# Patient Record
Sex: Female | Born: 1995 | Race: White | Hispanic: No | Marital: Single | Smoking: Former smoker
Health system: Southern US, Community
[De-identification: ages and names within clinical notes are randomized; demographics above are authoritative.]

## PROBLEM LIST (undated history)

## (undated) ENCOUNTER — Inpatient Hospital Stay (HOSPITAL_COMMUNITY): Payer: Self-pay

## (undated) DIAGNOSIS — F32A Depression, unspecified: Secondary | ICD-10-CM

## (undated) DIAGNOSIS — T7840XA Allergy, unspecified, initial encounter: Secondary | ICD-10-CM

## (undated) DIAGNOSIS — E119 Type 2 diabetes mellitus without complications: Secondary | ICD-10-CM

## (undated) DIAGNOSIS — F329 Major depressive disorder, single episode, unspecified: Secondary | ICD-10-CM

## (undated) DIAGNOSIS — F419 Anxiety disorder, unspecified: Secondary | ICD-10-CM

## (undated) DIAGNOSIS — F909 Attention-deficit hyperactivity disorder, unspecified type: Secondary | ICD-10-CM

## (undated) HISTORY — PX: DENTAL SURGERY: SHX609

## (undated) HISTORY — DX: Attention-deficit hyperactivity disorder, unspecified type: F90.9

## (undated) HISTORY — DX: Allergy, unspecified, initial encounter: T78.40XA

## (undated) HISTORY — DX: Anxiety disorder, unspecified: F41.9

## (undated) HISTORY — DX: Type 2 diabetes mellitus without complications: E11.9

## (undated) HISTORY — PX: THERAPEUTIC ABORTION: SHX798

---

## 2013-10-08 ENCOUNTER — Encounter: Payer: Self-pay | Admitting: Internal Medicine

## 2013-10-08 ENCOUNTER — Ambulatory Visit (INDEPENDENT_AMBULATORY_CARE_PROVIDER_SITE_OTHER): Payer: Commercial Indemnity | Admitting: Internal Medicine

## 2013-10-08 VITALS — BP 102/60 | HR 73 | Temp 97.7°F | Ht 64.0 in | Wt 123.5 lb

## 2013-10-08 DIAGNOSIS — Z23 Encounter for immunization: Secondary | ICD-10-CM

## 2013-10-08 DIAGNOSIS — F418 Other specified anxiety disorders: Secondary | ICD-10-CM | POA: Insufficient documentation

## 2013-10-08 DIAGNOSIS — F411 Generalized anxiety disorder: Secondary | ICD-10-CM

## 2013-10-08 DIAGNOSIS — Z0000 Encounter for general adult medical examination without abnormal findings: Secondary | ICD-10-CM

## 2013-10-08 HISTORY — DX: Other specified anxiety disorders: F41.8

## 2013-10-08 MED ORDER — ALPRAZOLAM 0.25 MG PO TABS: 0.2500 mg | ORAL_TABLET | Freq: Two times a day (BID) | ORAL | Status: DC | PRN

## 2013-10-08 NOTE — Patient Instructions (Signed)
Health Maintenance, 18- to 19-Year-Old SCHOOL PERFORMANCE After high school completion, the young adult may be attending college, technical or vocational school, or entering the military or the work force. SOCIAL AND EMOTIONAL DEVELOPMENT The young adult establishes adult relationships and explores sexual identity. Young adults may be living at home or in a college dorm or apartment. Increasing independence is important with young adults. Throughout adolescence, teens should assume responsibility of their own health care. IMMUNIZATIONS Most young adults should be fully vaccinated. A booster dose of Tdap (tetanus, diphtheria, and pertussis, or "whooping cough"), a dose of meningococcal vaccine to protect against a certain type of bacterial meningitis, hepatitis A, human papillomarvirus (HPV), chickenpox, or measles vaccines may be indicated, if not given at an earlier age. Annual influenza or "flu" vaccination should be considered during flu season.  TESTING Annual screening for vision and hearing problems is recommended. Vision should be screened objectively at least once between 18 and 19 years of age. The young adult may be screened for anemia or tuberculosis. Young adults should have a blood test to check for high cholesterol during this time period. Young adults should be screened for use of alcohol and drugs. If the young adult is sexually active, screening for sexually transmitted infections, pregnancy, or HIV may be performed. Screening for cervical cancer should be performed within 3 years of beginning sexual activity. NUTRITION AND ORAL HEALTH  Adequate calcium intake is important. Consume 3 servings of low-fat milk and dairy products daily. For those who do not drink milk or consume dairy products, calcium enriched foods, such as juice, bread, or cereal, dark, leafy greens, or canned fish are alternate sources of calcium.  Drink plenty of water. Limit fruit juice to 8 to 12 ounces per day.  Avoid sugary beverages or sodas.  Discourage skipping meals, especially breakfast. Teens should eat a good variety of vegetables and fruits, as well as lean meats.  Avoid high fat, high salt, and high sugar foods, such as candy, chips, and cookies.  Encourage young adults to participate in meal planning and preparation.  Eat meals together as a family whenever possible. Encourage conversation at mealtime.  Limit fast food choices and eating out at restaurants.  Brush teeth twice a day and floss.  Schedule dental exams twice a year. SLEEP Regular sleep habits are important. PHYSICAL, SOCIAL, AND EMOTIONAL DEVELOPMENT  One hour of regular physical activity daily is recommended. Continue to participate in sports.  Encourage young adults to develop their own interests and consider community service or volunteerism.  Provide guidance to the young adult in making decisions about college and work plans.  Make sure that young adults know that they should never be in a situation that makes them uncomfortable, and they should tell partners if they do not want to engage in sexual activity.  Talk to the young adult about body image. Eating disorders may be noted at this time. Young adults may also be concerned about being overweight. Monitor the young adult for weight gain or loss.  Mood disturbances, depression, anxiety, alcoholism, or attention problems may be noted in young adults. Talk to the caregiver if there are concerns about mental illness.  Negotiate limit setting and independent decision making.  Encourage the young adult to handle conflict without physical violence.  Avoid loud noises which may impair hearing.  Limit television and computer time to 2 hours per day. Individuals who engage in excessive sedentary activity are more likely to become overweight. RISK BEHAVIORS  Sexually active   young adults need to take precautions against pregnancy and sexually transmitted  infections. Talk to young adults about contraception.  Provide a tobacco-free and drug-free environment for the young adult. Talk to the young adult about drug, tobacco, and alcohol use among friends or at friends' homes. Make sure the young adult knows that smoking tobacco or marijuana and taking drugs have health consequences and may impact brain development.  Teach the young adult about appropriate use of over-the-counter or prescription medicines.  Establish guidelines for driving and for riding with friends.  Talk to young adults about the risks of drinking and driving or boating. Encourage the young adult to call you if he or she or friends have been drinking or using drugs.  Remind young adults to wear seat belts at all times in cars and life vests in boats.  Young adults should always wear a properly fitted helmet when they are riding a bicycle.  Use caution with all-terrain vehicles (ATVs) or other motorized vehicles.  Do not keep handguns in the home. (If you do, the gun and ammunition should be locked separately and out of the young adult's access.)  Equip your home with smoke detectors and change the batteries regularly. Make sure all family members know the fire escape plans for your home.  Teach young adults not to swim alone and not to dive in shallow water.  All individuals should wear sunscreen that protects against UVA and UVB light with at least a sun protection factor (SPF) of 30 when out in the sun. This minimizes sun burning. WHAT'S NEXT? Young adults should visit their pediatrician or family physician yearly. By young adulthood, health care should be transitioned to a family physician or internal medicine specialist. Sexually active females may want to begin annual physical exams with a gynecologist. Document Released: 03/02/2007 Document Revised: 02/27/2012 Document Reviewed: 03/22/2007 [employer] Patient Information 2014 [employer].  

## 2013-10-08 NOTE — Assessment & Plan Note (Signed)
Start low dose xanax prn  If doesn't work will refer to psych for evaluation of possible ADHD

## 2013-10-08 NOTE — Progress Notes (Signed)
HPI  Pt presents to the clinic today to establish care. She is transferring care from [facility] in [place]. She does have some concerns today about difficulty concentration, difficulty completing task. She is not sure if it is ADHD or anxiety. She has never been told she has ADHD. 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  Flu: yealry-needs one today Tetanus: 2011 LMP: 09/18/2013 Pap smear: never Dentist: yearly  Past Medical History  Diagnosis Date  . Allergy     No current outpatient prescriptions on file.   No current facility-administered medications for this visit.    No Known Allergies  Family History  Problem Relation Age of Onset  . Arthritis Maternal Grandmother   . Stroke Maternal Grandmother   . Sudden death Maternal Grandmother   . Depression Mother   . Diabetes Mother     History   Social History  . Marital Status: Single    Spouse Name: N/A    Number of Children: N/A  . Years of Education: N/A   Occupational History  . Not on file.   Social History Main Topics  . Smoking status: Current Every Day Smoker -- 0.50 packs/day  . Smokeless tobacco: Not on file  . Alcohol Use: No  . Drug Use: No  . Sexual Activity: Yes    Birth Control/ Protection: Condom   Other Topics Concern  . Not on file   Social History Narrative  . No narrative on file    ROS:  Constitutional: Denies fever, malaise, fatigue, headache or abrupt weight changes.  HEENT: Denies eye pain, eye redness, ear pain, ringing in the ears, wax buildup, runny nose, nasal congestion, bloody nose, or sore throat. Respiratory: Denies difficulty breathing, shortness of breath, cough or sputum production.   Cardiovascular: Denies chest pain, chest tightness, palpitations or swelling in the hands or feet.  Gastrointestinal: Denies abdominal pain, bloating, constipation, diarrhea or blood in the stool.   GU: Denies frequency, urgency, pain with urination, blood in urine, odor or discharge. Musculoskeletal: Denies decrease in range of motion, difficulty with gait, muscle pain or joint pain and swelling.  Skin: Denies redness, rashes, lesions or ulcercations.  Neurological: Denies dizziness, difficulty with memory, difficulty with speech or problems with balance and coordination.  Psych: Pt reports difficulty concentrating, procrastination, anxiety. Denies depression, SI/HI.  No other specific complaints in a complete review of systems (except as listed in HPI above).  PE:  BP 102/60  Pulse 73  Temp(Src) 97.7 F (36.5 C) (Oral)  Ht 5\' 4"  (1.626 m)  Wt 123 lb 8 oz (56.019 kg)  BMI 21.19 kg/m2  SpO2 98%  LMP 08/19/2013 Wt Readings from Last 3 Encounters:  10/08/13 123 lb 8 oz (56.019 kg) (42%*, Z = -0.20)   * Growth percentiles are based on CDC 2-20 Years data.    General: Appears her stated age, well developed, well nourished in NAD. HEENT: Head: normal shape and size; Eyes: sclera white, no icterus, conjunctiva pink, PERRLA and EOMs intact; Ears: Tm's gray and intact, normal light reflex; Nose: mucosa pink and moist, septum midline; Throat/Mouth: Teeth present, mucosa pink and moist, no lesions or ulcerations noted.  Neck: Normal range of motion. Neck supple, trachea midline. No massses, lumps or thyromegaly present.  Cardiovascular: Normal rate and rhythm. S1,S2 noted.  No murmur, rubs or gallops noted. No JVD or BLE edema. No carotid bruits noted.  Pulmonary/Chest: Normal effort and positive vesicular breath sounds. No respiratory distress. No wheezes, rales or ronchi noted.  Abdomen: Soft and nontender. Normal bowel sounds, no bruits noted. No distention or masses noted. Liver, spleen and kidneys non palpable. Musculoskeletal: Normal range of motion. No signs of joint swelling. No difficulty with gait.  Neurological: Alert and oriented. Cranial nerves II-XII intact. Coordination  normal. +DTRs bilaterally. Psychiatric: Mood and affect normal. Behavior is normal. Judgment and thought content normal.      Assessment and Plan:  Preventative Health maintenance:  Flu shot today Pt declines screening labs today Will return for her pap smear in 2 weeks Will discuss birth control and next visit Smoking cessation counseling given   RTC in 1 year or sooner if needed

## 2013-10-09 DIAGNOSIS — Z23 Encounter for immunization: Secondary | ICD-10-CM

## 2013-12-02 ENCOUNTER — Telehealth: Payer: Self-pay | Admitting: Internal Medicine

## 2013-12-02 NOTE — Telephone Encounter (Signed)
OK both Thx 

## 2013-12-02 NOTE — Telephone Encounter (Signed)
Pt called request to transfer from [facility] to [facility]. Pt also request pap smear. Please advise.

## 2013-12-03 NOTE — Telephone Encounter (Signed)
Can not leave vm, will continue to try °

## 2013-12-03 NOTE — Telephone Encounter (Signed)
Pt has an appt 1.12.15.

## 2013-12-09 ENCOUNTER — Ambulatory Visit: Payer: Self-pay | Admitting: Internal Medicine

## 2013-12-30 ENCOUNTER — Other Ambulatory Visit (HOSPITAL_COMMUNITY)
Admission: RE | Admit: 2013-12-30 | Discharge: 2013-12-30 | Disposition: A | Discharge status: Home / Self Care | Payer: Commercial Indemnity | Source: Ambulatory Visit | Attending: Internal Medicine | Admitting: Internal Medicine

## 2013-12-30 ENCOUNTER — Other Ambulatory Visit: Payer: Commercial Indemnity

## 2013-12-30 ENCOUNTER — Ambulatory Visit (INDEPENDENT_AMBULATORY_CARE_PROVIDER_SITE_OTHER): Payer: Commercial Indemnity | Admitting: Internal Medicine

## 2013-12-30 ENCOUNTER — Encounter: Payer: Self-pay | Admitting: Internal Medicine

## 2013-12-30 VITALS — BP 120/60 | HR 76 | Temp 98.0°F | Resp 16 | Ht 65.0 in | Wt 129.0 lb

## 2013-12-30 DIAGNOSIS — N898 Other specified noninflammatory disorders of vagina: Secondary | ICD-10-CM

## 2013-12-30 DIAGNOSIS — F418 Other specified anxiety disorders: Secondary | ICD-10-CM

## 2013-12-30 DIAGNOSIS — Z3009 Encounter for other general counseling and advice on contraception: Secondary | ICD-10-CM

## 2013-12-30 DIAGNOSIS — Z01419 Encounter for gynecological examination (general) (routine) without abnormal findings: Secondary | ICD-10-CM | POA: Insufficient documentation

## 2013-12-30 DIAGNOSIS — Z0000 Encounter for general adult medical examination without abnormal findings: Secondary | ICD-10-CM

## 2013-12-30 DIAGNOSIS — F411 Generalized anxiety disorder: Secondary | ICD-10-CM

## 2013-12-30 DIAGNOSIS — IMO0001 Reserved for inherently not codable concepts without codable children: Secondary | ICD-10-CM

## 2013-12-30 LAB — WET PREP, GENITAL
Clue Cells Wet Prep HPF POC: NONE SEEN — AB
Trich, Wet Prep: NONE SEEN — AB
WBC, Wet Prep HPF POC: NONE SEEN — AB
Yeast Wet Prep HPF POC: NONE SEEN — AB

## 2013-12-30 MED ORDER — VITAMIN D 1000 UNITS PO TABS: 1000.0000 [IU] | ORAL_TABLET | Freq: Every day | ORAL | Status: DC

## 2013-12-30 MED ORDER — ALPRAZOLAM 0.25 MG PO TABS: 0.2500 mg | ORAL_TABLET | Freq: Two times a day (BID) | ORAL | Status: DC | PRN

## 2013-12-30 MED ORDER — LEVONORGESTREL-ETHINYL ESTRAD 0.1-20 MG-MCG PO TABS: 1.0000 | ORAL_TABLET | Freq: Every day | ORAL | Status: DC

## 2013-12-30 NOTE — Progress Notes (Signed)
Pre visit review using our clinic review tool, if applicable. No additional management support is needed unless otherwise documented below in the visit note. 

## 2013-12-30 NOTE — Progress Notes (Signed)
   Subjective:    Patient ID: Madeline Mccormick, female    DOB: 02/10/1996, 18 y.o.   MRN: [id number]  HPI  The patient is here for a wellness exam. The patient has been doing well overall without major physical or psychological issues going on lately. LMP end of Dec - irreg  Review of Systems  Constitutional: Negative for fever, chills, diaphoresis, activity change, appetite change, fatigue and unexpected weight change.  HENT: Negative for congestion, dental problem, ear pain, hearing loss, mouth sores, postnasal drip, sinus pressure, sneezing, sore throat, tinnitus and voice change.   Eyes: Negative for pain and visual disturbance.  Respiratory: Negative for cough, chest tightness, wheezing and stridor.   Cardiovascular: Negative for chest pain, palpitations and leg swelling.  Gastrointestinal: Negative for nausea, vomiting, abdominal pain, blood in stool, abdominal distention and rectal pain.  Genitourinary: Negative for dysuria, frequency, hematuria, flank pain, decreased urine volume, vaginal bleeding, vaginal discharge, difficulty urinating, vaginal pain, menstrual problem and pelvic pain.  Musculoskeletal: Negative for back pain, gait problem, joint swelling and neck pain.  Skin: Negative for color change, rash and wound.  Neurological: Negative for dizziness, tremors, syncope, speech difficulty, weakness, light-headedness and numbness.  Hematological: Negative for adenopathy.  Psychiatric/Behavioral: Negative for suicidal ideas, hallucinations, behavioral problems, confusion, sleep disturbance, dysphoric mood and decreased concentration. The patient is not hyperactive.        Objective:   Physical Exam  Constitutional: She appears well-developed. No distress.  HENT:  Head: Normocephalic.  Right Ear: External ear normal.  Left Ear: External ear normal.  Nose: Nose normal.  Mouth/Throat: Oropharynx is clear and moist.  Eyes: Conjunctivae are normal. Pupils are equal, round, and  reactive to light. Right eye exhibits no discharge. Left eye exhibits no discharge.  Neck: Normal range of motion. Neck supple. No JVD present. No tracheal deviation present. No thyromegaly present.  Cardiovascular: Normal rate, regular rhythm and normal heart sounds.   Pulmonary/Chest: No stridor. No respiratory distress. She has no wheezes. She exhibits no tenderness.  Abdominal: Soft. Bowel sounds are normal. She exhibits no distension and no mass. There is no tenderness. There is no rebound and no guarding.  Genitourinary: Uterus normal. Vaginal discharge (minimal) found.  WNL PAP, KOH obtained B breasts WNL  Musculoskeletal: She exhibits no edema and no tenderness.  Lymphadenopathy:    She has no cervical adenopathy.  Neurological: She displays normal reflexes. No cranial nerve deficit. She exhibits normal muscle tone. Coordination normal.  Skin: No rash noted. No erythema. No pallor.  Psychiatric: She has a normal mood and affect. Her behavior is normal. Judgment and thought content normal.          Assessment & Plan:

## 2013-12-31 ENCOUNTER — Other Ambulatory Visit (INDEPENDENT_AMBULATORY_CARE_PROVIDER_SITE_OTHER): Payer: Commercial Indemnity

## 2013-12-31 ENCOUNTER — Encounter: Payer: Self-pay | Admitting: Internal Medicine

## 2013-12-31 DIAGNOSIS — N898 Other specified noninflammatory disorders of vagina: Secondary | ICD-10-CM | POA: Insufficient documentation

## 2013-12-31 DIAGNOSIS — IMO0001 Reserved for inherently not codable concepts without codable children: Secondary | ICD-10-CM

## 2013-12-31 DIAGNOSIS — Z0000 Encounter for general adult medical examination without abnormal findings: Secondary | ICD-10-CM

## 2013-12-31 DIAGNOSIS — Z3009 Encounter for other general counseling and advice on contraception: Secondary | ICD-10-CM | POA: Insufficient documentation

## 2013-12-31 LAB — URINALYSIS, ROUTINE W REFLEX MICROSCOPIC
BILIRUBIN URINE: NEGATIVE
Ketones, ur: NEGATIVE
LEUKOCYTES UA: NEGATIVE
NITRITE: NEGATIVE
PH: 6 (ref 5.0–8.0)
Specific Gravity, Urine: >=1.03 — AB (ref 1.000–1.030)
Total Protein, Urine: NEGATIVE
Urine Glucose: NEGATIVE
Urobilinogen, UA: 0.2 (ref 0.0–1.0)

## 2013-12-31 LAB — BASIC METABOLIC PANEL
BUN: 16 mg/dL (ref 6–23)
CO2: 24 meq/L (ref 19–32)
CREATININE: 0.8 mg/dL (ref 0.4–1.2)
Calcium: 9.2 mg/dL (ref 8.4–10.5)
Chloride: 108 mEq/L (ref 96–112)
GFR: 92.08 mL/min (ref >60.00)
GLUCOSE: 84 mg/dL (ref 70–99)
Potassium: 3.7 mEq/L (ref 3.5–5.1)
Sodium: 140 mEq/L (ref 135–145)

## 2013-12-31 LAB — CBC WITH DIFFERENTIAL/PLATELET
BASOS PCT: 0.5 % (ref 0.0–3.0)
Basophils Absolute: 0 10*3/uL (ref 0.0–0.1)
EOS ABS: 0.2 10*3/uL (ref 0.0–0.7)
Eosinophils Relative: 2.9 % (ref 0.0–5.0)
HCT: 39.7 % (ref 36.0–46.0)
Hemoglobin: 13.5 g/dL (ref 12.0–15.0)
Lymphocytes Relative: 38.5 % (ref 12.0–46.0)
Lymphs Abs: 2.1 10*3/uL (ref 0.7–4.0)
MCHC: 33.9 g/dL (ref 30.0–36.0)
MCV: 89.2 fl (ref 78.0–100.0)
Monocytes Absolute: 0.5 10*3/uL (ref 0.1–1.0)
Monocytes Relative: 9.1 % (ref 3.0–12.0)
NEUTROS ABS: 2.7 10*3/uL (ref 1.4–7.7)
Neutrophils Relative %: 49 % (ref 43.0–77.0)
Platelets: 284 10*3/uL (ref 150.0–400.0)
RBC: 4.46 Mil/uL (ref 3.87–5.11)
RDW: 13.2 % (ref 11.5–14.6)
WBC: 5.6 10*3/uL (ref 4.5–10.5)

## 2013-12-31 LAB — HEPATIC FUNCTION PANEL
ALK PHOS: 45 U/L (ref 39–117)
ALT: 9 U/L (ref 0–35)
AST: 16 U/L (ref 0–37)
Albumin: 4.1 g/dL (ref 3.5–5.2)
BILIRUBIN DIRECT: 0.1 mg/dL (ref 0.0–0.3)
BILIRUBIN TOTAL: 0.7 mg/dL (ref 0.3–1.2)
Total Protein: 7.5 g/dL (ref 6.0–8.3)

## 2013-12-31 LAB — LIPID PANEL
CHOL/HDL RATIO: 3
Cholesterol: 162 mg/dL (ref 0–200)
HDL: 63.8 mg/dL (ref >39.00)
LDL Cholesterol: 87 mg/dL (ref 0–99)
TRIGLYCERIDES: 54 mg/dL (ref 0.0–149.0)
VLDL: 10.8 mg/dL (ref 0.0–40.0)

## 2013-12-31 LAB — TSH: TSH: 2.06 u[IU]/mL (ref 0.35–5.50)

## 2013-12-31 NOTE — Assessment & Plan Note (Signed)
Discussed Started Alesse 1/15 Labs

## 2013-12-31 NOTE — Assessment & Plan Note (Signed)
We discussed age appropriate health related issues, including available/recomended screening tests and vaccinations. We discussed a need for adhering to healthy diet and exercise. Labs/EKG were reviewed/ordered. All questions were answered.  Labs Gardasil info

## 2013-12-31 NOTE — Assessment & Plan Note (Signed)
1/15  KOH

## 2014-01-01 LAB — HCG, SERUM, QUALITATIVE: Preg, Serum: NEGATIVE

## 2014-02-03 ENCOUNTER — Ambulatory Visit: Payer: Commercial Indemnity | Admitting: Internal Medicine

## 2014-02-10 ENCOUNTER — Encounter: Payer: Self-pay | Admitting: Internal Medicine

## 2014-02-10 ENCOUNTER — Ambulatory Visit (INDEPENDENT_AMBULATORY_CARE_PROVIDER_SITE_OTHER): Payer: Commercial Indemnity | Admitting: Internal Medicine

## 2014-02-10 VITALS — BP 128/72 | HR 80 | Temp 98.0°F | Resp 16 | Wt 132.0 lb

## 2014-02-10 DIAGNOSIS — J069 Acute upper respiratory infection, unspecified: Secondary | ICD-10-CM

## 2014-02-10 MED ORDER — AZITHROMYCIN 250 MG PO TABS: ORAL_TABLET | ORAL | Status: DC

## 2014-02-10 MED ORDER — PROMETHAZINE-CODEINE 6.25-10 MG/5ML PO SYRP: 5.0000 mL | ORAL_SOLUTION | ORAL | Status: DC | PRN

## 2014-02-10 NOTE — Progress Notes (Signed)
   Subjective:    Patient ID: Madeline Mccormick, female    DOB: 11/22/1996, 18 y.o.   MRN: [id number]  URI  This is a new problem. The current episode started 1 to 4 weeks ago (2 wks). The problem has been gradually worsening. There has been no fever. Associated symptoms include congestion, coughing, headaches, rhinorrhea and sinus pain. Pertinent negatives include no abdominal pain.      Review of Systems  HENT: Positive for congestion and rhinorrhea.   Respiratory: Positive for cough.   Gastrointestinal: Negative for abdominal pain.  Neurological: Positive for headaches.       Objective:   Physical Exam  Constitutional: She appears well-developed. No distress.  HENT:  Head: Normocephalic.  Right Ear: External ear normal.  Left Ear: External ear normal.  Nose: Nose normal.  Mouth/Throat: No oropharyngeal exudate.  eryth throat Green mucus i B nares  Eyes: Conjunctivae are normal. Pupils are equal, round, and reactive to light. Right eye exhibits no discharge. Left eye exhibits no discharge.  Neck: Normal range of motion. Neck supple. No JVD present. No tracheal deviation present. No thyromegaly present.  Cardiovascular: Normal rate, regular rhythm and normal heart sounds.   Pulmonary/Chest: No stridor. No respiratory distress. She has no wheezes.  Abdominal: Soft. Bowel sounds are normal. She exhibits no distension and no mass. There is no tenderness. There is no rebound and no guarding.  Musculoskeletal: She exhibits no edema and no tenderness.  Lymphadenopathy:    She has no cervical adenopathy.  Neurological: She displays normal reflexes. No cranial nerve deficit. She exhibits normal muscle tone. Coordination normal.  Skin: No rash noted. No erythema.  Psychiatric: She has a normal mood and affect. Her behavior is normal. Judgment and thought content normal.          Assessment & Plan:

## 2014-02-10 NOTE — Patient Instructions (Signed)
Use over-the-counter  "cold" medicines  such as "Tylenol cold" , "Advil cold",  "Mucinex" or" Mucinex D"  for cough and congestion.   Avoid decongestants if you have high blood pressure and use "Afrin" nasal spray for nasal congestion as directed instead. Use" Delsym" or" Robitussin" cough syrup varietis for cough.  You can use plain "Tylenol" or "Advil" for fever, chills and achyness.  Please, make an appointment if you are not better or if you're worse.  

## 2014-02-10 NOTE — Assessment & Plan Note (Signed)
Zpac Prom - cod syr prn BCP and abx interaction was mentioned

## 2014-02-10 NOTE — Progress Notes (Signed)
Pre visit review using our clinic review tool, if applicable. No additional management support is needed unless otherwise documented below in the visit note. 

## 2014-04-30 ENCOUNTER — Ambulatory Visit (INDEPENDENT_AMBULATORY_CARE_PROVIDER_SITE_OTHER): Payer: Commercial Indemnity | Admitting: Internal Medicine

## 2014-04-30 ENCOUNTER — Encounter: Payer: Self-pay | Admitting: Internal Medicine

## 2014-04-30 VITALS — BP 108/52 | Temp 98.2°F | Wt 124.0 lb

## 2014-04-30 DIAGNOSIS — F329 Major depressive disorder, single episode, unspecified: Secondary | ICD-10-CM

## 2014-04-30 DIAGNOSIS — F411 Generalized anxiety disorder: Secondary | ICD-10-CM

## 2014-04-30 DIAGNOSIS — M7061 Trochanteric bursitis, right hip: Secondary | ICD-10-CM | POA: Insufficient documentation

## 2014-04-30 DIAGNOSIS — F418 Other specified anxiety disorders: Secondary | ICD-10-CM

## 2014-04-30 DIAGNOSIS — F3289 Other specified depressive episodes: Secondary | ICD-10-CM

## 2014-04-30 DIAGNOSIS — M76899 Other specified enthesopathies of unspecified lower limb, excluding foot: Secondary | ICD-10-CM

## 2014-04-30 HISTORY — DX: Trochanteric bursitis, right hip: M70.61

## 2014-04-30 MED ORDER — BUPROPION HCL ER (XL) 150 MG PO TB24: 150.0000 mg | ORAL_TABLET | Freq: Every day | ORAL | Status: DC

## 2014-04-30 MED ORDER — NAPROXEN 500 MG PO TABS: 500.0000 mg | ORAL_TABLET | Freq: Two times a day (BID) | ORAL | Status: DC

## 2014-04-30 NOTE — Progress Notes (Signed)
   Subjective:     HPI  C/o R hip pain off and on x 2 y C/o depression C/o ADD issues  Review of Systems  HENT: Positive for congestion and rhinorrhea.   Respiratory: Positive for cough.   Gastrointestinal: Negative for abdominal pain.  Neurological: Positive for headaches.   BP Readings from Last 3 Encounters:  04/30/14 108/52  02/10/14 128/72  12/30/13 120/60   Wt Readings from Last 3 Encounters:  04/30/14 124 lb (56.246 kg)  02/10/14 132 lb (59.875 kg) (57%*, Z = 0.17)  12/30/13 129 lb (58.514 kg) (52%*, Z = 0.04)   * Growth percentiles are based on CDC 2-20 Years data.        Objective:   Physical Exam  Constitutional: She appears well-developed. No distress.  HENT:  Head: Normocephalic.  Right Ear: External ear normal.  Left Ear: External ear normal.  Nose: Nose normal.  Mouth/Throat: No oropharyngeal exudate.  eryth throat Green mucus i B nares  Eyes: Conjunctivae are normal. Pupils are equal, round, and reactive to light. Right eye exhibits no discharge. Left eye exhibits no discharge.  Neck: Normal range of motion. Neck supple. No JVD present. No tracheal deviation present. No thyromegaly present.  Cardiovascular: Normal rate, regular rhythm and normal heart sounds.   Pulmonary/Chest: No stridor. No respiratory distress. She has no wheezes.  Abdominal: Soft. Bowel sounds are normal. She exhibits no distension and no mass. There is no tenderness. There is no rebound and no guarding.  Musculoskeletal: She exhibits no edema and no tenderness.  Lymphadenopathy:    She has no cervical adenopathy.  Neurological: She displays normal reflexes. No cranial nerve deficit. She exhibits normal muscle tone. Coordination normal.  Skin: No rash noted. No erythema.  Psychiatric: She has a normal mood and affect. Her behavior is normal. Judgment and thought content normal.   Lab Results  Component Value Date   WBC 5.6 12/31/2013   HGB 13.5 12/31/2013   HCT 39.7 12/31/2013    PLT 284.0 12/31/2013   GLUCOSE 84 12/31/2013   CHOL 162 12/31/2013   TRIG 54.0 12/31/2013   HDL 63.80 12/31/2013   LDLCALC 87 12/31/2013   ALT 9 12/31/2013   AST 16 12/31/2013   NA 140 12/31/2013   K 3.7 12/31/2013   CL 108 12/31/2013   CREATININE 0.8 12/31/2013   BUN 16 12/31/2013   CO2 24 12/31/2013   TSH 2.06 12/31/2013          Assessment & Plan:

## 2014-04-30 NOTE — Progress Notes (Signed)
Pre visit review using our clinic review tool, if applicable. No additional management support is needed unless otherwise documented below in the visit note. 

## 2014-04-30 NOTE — Patient Instructions (Signed)
Hip openers

## 2014-04-30 NOTE — Assessment & Plan Note (Signed)
Naproxen 500mg  

## 2014-05-04 ENCOUNTER — Encounter: Payer: Self-pay | Admitting: Internal Medicine

## 2014-05-04 DIAGNOSIS — F329 Major depressive disorder, single episode, unspecified: Secondary | ICD-10-CM | POA: Insufficient documentation

## 2014-05-04 DIAGNOSIS — F32A Depression, unspecified: Secondary | ICD-10-CM | POA: Insufficient documentation

## 2014-05-04 HISTORY — DX: Depression, unspecified: F32.A

## 2014-05-04 NOTE — Assessment & Plan Note (Signed)
Discussed Start Wellbutrin XR

## 2014-05-04 NOTE — Assessment & Plan Note (Signed)
Discussed Start Wellbutrin XR 

## 2014-05-16 ENCOUNTER — Other Ambulatory Visit: Payer: Commercial Indemnity

## 2014-11-18 ENCOUNTER — Encounter: Payer: Self-pay | Admitting: Internal Medicine

## 2014-11-18 ENCOUNTER — Ambulatory Visit (INDEPENDENT_AMBULATORY_CARE_PROVIDER_SITE_OTHER): Payer: Commercial Indemnity | Admitting: Internal Medicine

## 2014-11-18 VITALS — BP 105/70 | HR 60 | Temp 97.9°F | Ht 65.0 in | Wt 115.0 lb

## 2014-11-18 DIAGNOSIS — R634 Abnormal weight loss: Secondary | ICD-10-CM

## 2014-11-18 DIAGNOSIS — R7309 Other abnormal glucose: Secondary | ICD-10-CM

## 2014-11-18 DIAGNOSIS — R7303 Prediabetes: Secondary | ICD-10-CM | POA: Insufficient documentation

## 2014-11-18 DIAGNOSIS — R300 Dysuria: Secondary | ICD-10-CM

## 2014-11-18 LAB — URINALYSIS, ROUTINE W REFLEX MICROSCOPIC
Bilirubin Urine: NEGATIVE
Hgb urine dipstick: NEGATIVE
Ketones, ur: NEGATIVE
Leukocytes, UA: NEGATIVE
Nitrite: NEGATIVE
PH: 7 (ref 5.0–8.0)
RBC / HPF: NONE SEEN (ref 0–?)
SPECIFIC GRAVITY, URINE: 1.02 (ref 1.000–1.030)
TOTAL PROTEIN, URINE-UPE24: NEGATIVE
URINE GLUCOSE: NEGATIVE
Urobilinogen, UA: 0.2 (ref 0.0–1.0)

## 2014-11-18 LAB — T4, FREE: FREE T4: 0.98 ng/dL (ref 0.60–1.60)

## 2014-11-18 LAB — T3, FREE: T3, Free: 3.6 pg/mL (ref 2.3–4.2)

## 2014-11-18 LAB — HEMOGLOBIN A1C: Hgb A1c MFr Bld: 5.7 % (ref 4.6–6.5)

## 2014-11-18 LAB — TSH: TSH: 1.18 u[IU]/mL (ref 0.35–5.50)

## 2014-11-18 NOTE — Progress Notes (Signed)
Patient ID: Madeline Mccormick, female   DOB: 1996/06/01, 18 y.o.   MRN: [id number]  HPI: Madeline Mccormick is a 18 y.o.-year-old female, referred by her PCP, Dr. Fara Chute, for management of prediabetes.  Pt presented to the dr for [facility] if she delays meals. Also, she feels weak (labout to pass out) when delays the meal. She craves sweets. Patient has been diagnosed with prediabetes in 09/2014. She describes frequent urination and burning.   Last hemoglobin A1c was: 09/26/2014: HbA1c 5.8%  She is not on any medicines for prediabetes.  Pt's meals are: - Breakfast: skips - Lunch: sandwich; pasta; meat - Dinner: pasta or vegetable + meat - Snacks: candy + sodas: mountain dew - 1-2 a day; + sweet tea - 8 oz a day or more  - no CKD, last BUN/creatinine:  09/2014: 14/0.85, GFR 99 Lab Results  Component Value Date   BUN 16 12/31/2013   CREATININE 0.8 12/31/2013   - last set of lipids: Lab Results  Component Value Date   CHOL 162 12/31/2013   HDL 63.80 12/31/2013   LDLCALC 87 12/31/2013   TRIG 54.0 12/31/2013   CHOLHDL 3 12/31/2013   - last eye exam was 2 years ago.  - no numbness and tingling in her feet.  Pt has FH of DM1 in mother (dx in her 61s).  Has FH of thyroid ds in mother. Last TSH normal: Lab Results  Component Value Date   TSH 2.06 12/31/2013   Denies palpitations, but has 17 lbs weight loss in last 10 mo.  She has tremors with outstretched hands.   ROS: Constitutional: + weight loss, no fatigue,+ subjective hypothermia, + poor sleep Eyes: no blurry vision, no xerophthalmia ENT: no sore throat, no nodules palpated in throat, no dysphagia/odynophagia, no hoarseness Cardiovascular: no CP/SOB/palpitations/leg swelling Respiratory: no cough/SOB Gastrointestinal: no N/V/D/C Musculoskeletal: no muscle/joint aches Skin: no rashes, + easy bruising Neurological: no tremors/numbness/tingling/dizziness Psychiatric: + depression/+ anxiety + dysuria  Past Medical History   Diagnosis Date  . Allergy   . Anxiety    Past Surgical History  Procedure Laterality Date  . Dental surgery     History   Social History  . 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 Smoking status: Light Tobacco Smoker -- 0.50 packs/day    Types: Cigarettes    Last Attempt to Quit: 12/19/2013  . Alcohol Use: No  . Drug Use: No  . Sexual Activity: Yes    Birth Control/ Protection: Condom   Current Outpatient Prescriptions on File Prior to Visit  Medication Sig Dispense Refill  . levonorgestrel-ethinyl estradiol (AVIANE,ALESSE,LESSINA) 0.1-20 MG-MCG tablet Take 1 tablet by mouth daily. 30 tablet 11  . cholecalciferol (VITAMIN D) 1000 UNITS tablet Take 1 tablet (1,000 Units total) by mouth daily. (Patient not taking: Reported on 11/18/2014) 100 tablet 3  . naproxen (NAPROSYN) 500 MG tablet Take 1 tablet (500 mg total) by mouth 2 (two) times daily with a meal. (Patient not taking: Reported on 11/18/2014) 60 tablet 2   No current facility-administered medications on file prior to visit.   No Known Allergies Family History  Problem Relation Age of Onset  . Arthritis Maternal Grandmother   . Stroke Maternal Grandmother   . Sudden death Maternal Grandmother   . Depression Mother   . Diabetes Mother    PE: BP 105/70 mmHg  Pulse 60  Temp(Src) 97.9 F (36.6 C)  Ht 5\' 5"  (1.651 m)  Wt 115 lb (52.164 kg)  BMI 19.14 kg/m2  SpO2 98% Wt Readings from Last 3 Encounters:  11/18/14 115 lb (52.164 kg)  04/30/14 124 lb (56.246 kg)  02/10/14 132 lb (59.875 kg) (57 %*, Z = 0.17)   * Growth percentiles are based on CDC 2-20 Years data.   Constitutional: normal weight, in NAD Eyes: PERRLA, EOMI, no exophthalmos ENT: moist mucous membranes, no thyromegaly, no cervical lymphadenopathy Cardiovascular: RRR, No MRG Respiratory: CTA B Gastrointestinal: abdomen soft, NT, ND,  BS+ Musculoskeletal: no deformities, strength intact in all 4 Skin: moist, warm, no rashes Neurological: + tremor with outstretched hands, DTR normal in all 4  ASSESSMENT: 1. Prediabetes  2. Dysuria  3. Weight loss - 17 lbs, unintentional  PLAN:  1. Patient with a recent HbA1c that was elevated, at 5.8%. She has frequent urination, however she also complains of dysuria so she might have a urinary tract infection. She drinks many regular sodas and sweet tea in the day, and I advised her to stop doing so. She also eats a lot of candy and we discussed about "sugar crash" the can because by high glycemic index diet. I advised her to try to limit the amount of simple carbs that she eats, and definitely not to drink any liquid simple carbs. I also strongly advised her to start eating breakfast so she can avoid hypoglycemia later in the day. - We discussed about options for treatment, and I suggested to:  Patient Instructions  Please stop at the lab. Stop drinking sweet sodas and sweet tea. Check sugars every day or every other day at different times of the day  - bring log at next visit. Please come back for a follow-up appointment in 3 months. - I advised her to start checking sugars at different times of the day - check once every day or every other day, rotating checks - We gave her a meter and advised her how to check her sugars - given sugar log and advised how to fill it and to bring it at next appt  - given foot care handout and explained the principles  - given instructions for hypoglycemia management "15-15 rule"  - We'll check another hemoglobin A1c today to confirm the diagnosis of prediabetes - Return to clinic in 3 mo with sugar log   2. Dysuria - We will check a urinalysis with reflex microbiology  3. Unintentional weight loss  - Reviewing her chart, the patient has lost 17 pounds since 01/2014. She mentions that she did not try to lose the weight and this happened even  though she moved home during this year. Since she also has tremors with outstretched hands, I would like to check her for thyroid disease  - We'll check TSH, free T4, free T3 today   Office Visit on 11/18/2014  Component Date Value Ref Range Status  . Hgb A1c MFr Bld 11/18/2014 5.7  4.6 - 6.5 % Final   Glycemic Control Guidelines for People with Diabetes:Non Diabetic:  <6%Goal of Therapy: <7%Additional Action Suggested:  >8%   . Color, Urine 11/18/2014 YELLOW  Yellow;Lt. Yellow Final  . APPearance 11/18/2014 CLEAR  Clear Final  . Specific Gravity, Urine 11/18/2014 1.020  1.000-1.030 Final  . pH 11/18/2014 7.0  5.0 - 8.0 Final  . Total Protein, Urine 11/18/2014 NEGATIVE  Negative Final  . Urine Glucose 11/18/2014 NEGATIVE  Negative Final  .  Ketones, ur 11/18/2014 NEGATIVE  Negative Final  . Bilirubin Urine 11/18/2014 NEGATIVE  Negative Final  . Hgb urine dipstick 11/18/2014 NEGATIVE  Negative Final  . Urobilinogen, UA 11/18/2014 0.2  0.0 - 1.0 Final  . Leukocytes, UA 11/18/2014 NEGATIVE  Negative Final  . Nitrite 11/18/2014 NEGATIVE  Negative Final  . WBC, UA 11/18/2014 0-2/hpf  0-2/hpf Final  . RBC / HPF 11/18/2014 none seen  0-2/hpf Final  . Squamous Epithelial / LPF 11/18/2014 Rare(0-4/hpf)  Rare(0-4/hpf) Final  . TSH 11/18/2014 1.18  0.35 - 5.50 uIU/mL Final  . Free T4 11/18/2014 0.98  0.60 - 1.60 ng/dL Final  . T3, Free 86/57/846912/12/2013 3.6  2.3 - 4.2 pg/mL Final   Msg sent: Dear [place], Labs are normal, except the HbA1c which is borderline between normal and prediabetes. Start the dietary changes that I suggested and it will decrease. You do not have a urinary tract infection. Thyroid tests are normal, too. Sincerely, Carlus Pavlovristina Lorence Nagengast MD

## 2014-11-18 NOTE — Patient Instructions (Signed)
Please stop at the lab. Stop drinking sweet sodas and sweet tea. Check sugars every day or every other day at different times of the day  - bring log at next visit. Please come back for a follow-up appointment in 3 months.

## 2014-11-18 NOTE — Progress Notes (Signed)
Pre visit review using our clinic review tool, if applicable. No additional management support is needed unless otherwise documented below in the visit note. 

## 2014-11-24 ENCOUNTER — Telehealth: Payer: Self-pay | Admitting: Internal Medicine

## 2014-11-24 ENCOUNTER — Other Ambulatory Visit: Payer: Self-pay | Admitting: *Deleted

## 2014-11-24 NOTE — Telephone Encounter (Signed)
Patient called stating she never received a rx for her test strips   [facility]    Thank you

## 2014-11-25 ENCOUNTER — Telehealth: Payer: Self-pay | Admitting: Internal Medicine

## 2014-11-25 MED ORDER — GLUCOSE BLOOD VI STRP: ORAL_STRIP | Status: DC

## 2014-11-25 NOTE — Telephone Encounter (Signed)
Called pt back again. Advised her to let front office staff know her meter so we can order her strips. Done.

## 2014-11-25 NOTE — Telephone Encounter (Signed)
Pt is calling to get in touch with you regarding test strips.

## 2014-11-25 NOTE — Telephone Encounter (Signed)
Called pt and lvm advising her to let us know what test meter she has so we can call in her test strips.

## 2014-12-31 ENCOUNTER — Encounter: Payer: Commercial Indemnity | Admitting: Internal Medicine

## 2015-02-17 ENCOUNTER — Ambulatory Visit: Payer: Commercial Indemnity | Admitting: Internal Medicine

## 2015-04-02 ENCOUNTER — Ambulatory Visit (INDEPENDENT_AMBULATORY_CARE_PROVIDER_SITE_OTHER): Payer: Commercial Indemnity | Admitting: Internal Medicine

## 2015-04-02 ENCOUNTER — Encounter: Payer: Self-pay | Admitting: Internal Medicine

## 2015-04-02 VITALS — BP 102/58 | HR 90 | Temp 98.2°F | Resp 12 | Wt 109.0 lb

## 2015-04-02 DIAGNOSIS — R7303 Prediabetes: Secondary | ICD-10-CM

## 2015-04-02 DIAGNOSIS — R634 Abnormal weight loss: Secondary | ICD-10-CM

## 2015-04-02 DIAGNOSIS — R7309 Other abnormal glucose: Secondary | ICD-10-CM | POA: Diagnosis not present

## 2015-04-02 LAB — HEMOGLOBIN A1C: Hgb A1c MFr Bld: 5.3 % (ref 4.6–6.5)

## 2015-04-02 NOTE — Patient Instructions (Signed)
Please stop at the lab.  Please check sugars every other day.

## 2015-04-02 NOTE — Progress Notes (Signed)
Patient ID: Madeline Mccormick, female   DOB: 12/22/1995, 19 y.o.   MRN: [id number]  HPI: Madeline Mccormick is a 19 y.o.-year-old female, initially referred by her PCP, Dr. Fara Chute, for management of prediabetes. Last visit:4 mo ago.  Reviewed and addended hx: Pt presented to the dr for [facility] if she delays meals. Also, she was feeling weak (about to pass out) when delayed a meal. She also was craving sweets. She was drinking a lot of regular soda and eating many sweets.  Patient has been diagnosed with prediabetes in 09/2014.   At last visit, we discussed about improving her diet, to eliminate the sweets in her diet and the regular sodas. She was able to eliminate the sweets but only to reduce the sodas. She is already feeling better and she does not feel weak or faint if she delays a meal!  We also gave her a glucometer at last visit. After last visit >> she felt palpitations and weakness/fatigue/SOB >> sugar checked: >200! This happened once.  She had lost 17 lbs weight loss before last visit. She lost 6 more lbs since last visit. This is unintentional, but she has been cutting down sugars as mentioned above and was also started on Vyvanse. We checked thyroid tests at last visit and they were normal.  Last hemoglobin A1c was: Lab Results  Component Value Date   HGBA1C 5.7 11/18/2014  09/26/2014: HbA1c 5.8%  She is not on any medicines for prediabetes.   - no CKD, last BUN/creatinine:  09/2014: 14/0.85, GFR 99 Lab Results  Component Value Date   BUN 16 12/31/2013   CREATININE 0.8 12/31/2013   - last set of lipids: Lab Results  Component Value Date   CHOL 162 12/31/2013   HDL 63.80 12/31/2013   LDLCALC 87 12/31/2013   TRIG 54.0 12/31/2013   CHOLHDL 3 12/31/2013   - last eye exam was 2 years ago.  - no numbness and tingling in her feet.  Last TSH normal: Lab Results  Component Value Date   TSH 1.18 11/18/2014   ROS: Constitutional: + weight loss, no fatigue,no subjective  hypothermia Eyes: no blurry vision, no xerophthalmia ENT: no sore throat, no nodules palpated in throat, no dysphagia/odynophagia, no hoarseness Cardiovascular: no CP/SOB/palpitations/leg swelling Respiratory: no cough/SOB Gastrointestinal: no N/V/D/C Musculoskeletal: no muscle/joint aches Skin: no rashes Neurological: no tremors/numbness/tingling/dizziness Psychiatric: + depression/+ anxiety  I reviewed pt's medications, allergies, PMH, social hx, family hx, and changes were documented in the history of present illness. Otherwise, unchanged from my initial visit note. She started Vivance 2 weeks ago.   Past Medical History  Diagnosis Date  . Allergy   . Anxiety    Past Surgical History  Procedure Laterality Date  . Dental surgery     History   Social History  . 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 Smoking status: Light Tobacco Smoker -- 0.50 packs/day    Types: Cigarettes    Last Attempt to Quit: 12/19/2013  . Alcohol Use: No  . Drug Use: No  . Sexual Activity: Yes    Birth Control/ Protection: Condom   Current Outpatient Prescriptions on File Prior to Visit  Medication Sig Dispense Refill  . glucose blood (ONETOUCH VERIO) test strip Use to test blood sugar 3 times daily as instructed. 100 each 11  . citalopram (CELEXA) 20 MG  tablet     . naproxen (NAPROSYN) 500 MG tablet Take 1 tablet (500 mg total) by mouth 2 (two) times daily with a meal. (Patient not taking: Reported on 04/02/2015) 60 tablet 2   No current facility-administered medications on file prior to visit.   No Known Allergies Family History  Problem Relation Age of Onset  . Arthritis Maternal Grandmother   . Stroke Maternal Grandmother   . Sudden death Maternal Grandmother   . Depression Mother   . Diabetes Mother    PE: BP 102/58 mmHg  Pulse 90  Temp(Src) 98.2 F (36.8 C) (Oral)   Resp 12  Wt 109 lb (49.442 kg)  SpO2 97% Body mass index is 18.14 kg/(m^2). Wt Readings from Last 3 Encounters:  04/02/15 109 lb (49.442 kg)  11/18/14 115 lb (52.164 kg)  04/30/14 124 lb (56.246 kg)   Constitutional: thin, in NAD Eyes: PERRLA, EOMI, no exophthalmos ENT: moist mucous membranes, no thyromegaly, no cervical lymphadenopathy Cardiovascular: RRR, No MRG Respiratory: CTA B Gastrointestinal: abdomen soft, NT, ND, BS+ Musculoskeletal: no deformities, strength intact in all 4 Skin: moist, warm, no rashes Neurological: no tremor with outstretched hands, DTR normal in all 4  ASSESSMENT: 1. Prediabetes  2. Weight loss - 17 + 6 lbs, unintentional  PLAN:  1. Patient with recent HbA1c levels that were elevated, at 5.8%, then 5.7%. She still drinks regular sodas but cut down on other sweets. She feels better. She had a high value on a fingerstick 2 mo ago. No high sugars anymore when she checks but did not check many toimes. - We discussed about options for treatment, and I suggested to:  Patient Instructions  Please stop at the lab. Stop drinking sweet sodas and sweet tea. Check sugars every day or every other day at different times of the day  - bring log at next visit. Please come back for a follow-up appointment in 3 months. - I advised her to start checking sugars at different times of the day - check once every other day  - We'll check another hemoglobin A1c today - Return to clinic in 3 mo with sugar log   2. Unintentional weight loss  - Reviewing her chart, the patient has lost 23 pounds since 01/2014.  - reviewed TSH, free T4, free T3 from 4 mo ago >> normal - now on Vyvanse, which can contribute - advised to increase calories/proteins in diet  Office Visit on 04/02/2015  Component Date Value Ref Range Status  . Hgb A1c MFr Bld 04/02/2015 5.3  4.6 - 6.5 % Final   Glycemic Control Guidelines for People with Diabetes:Non Diabetic:  <6%Goal of Therapy:  <7%Additional Action Suggested:  >8%    HbA1c improved after cutting out sweets.

## 2016-04-11 ENCOUNTER — Encounter (HOSPITAL_COMMUNITY): Payer: Self-pay

## 2016-04-11 ENCOUNTER — Emergency Department (HOSPITAL_COMMUNITY)
Admission: EM | Admit: 2016-04-11 | Discharge: 2016-04-11 | Disposition: A | Discharge status: Home / Self Care | Payer: 59 | Attending: Emergency Medicine | Admitting: Emergency Medicine

## 2016-04-11 DIAGNOSIS — J069 Acute upper respiratory infection, unspecified: Secondary | ICD-10-CM

## 2016-04-11 DIAGNOSIS — J029 Acute pharyngitis, unspecified: Secondary | ICD-10-CM

## 2016-04-11 DIAGNOSIS — F17210 Nicotine dependence, cigarettes, uncomplicated: Secondary | ICD-10-CM | POA: Diagnosis not present

## 2016-04-11 DIAGNOSIS — R51 Headache: Secondary | ICD-10-CM | POA: Diagnosis not present

## 2016-04-11 LAB — URINALYSIS, ROUTINE W REFLEX MICROSCOPIC
Bilirubin Urine: NEGATIVE
Glucose, UA: NEGATIVE mg/dL
KETONES UR: NEGATIVE mg/dL
Leukocytes, UA: NEGATIVE
Nitrite: NEGATIVE
PROTEIN: NEGATIVE mg/dL
Specific Gravity, Urine: 1.015 (ref 1.005–1.030)
pH: 5.5 (ref 5.0–8.0)

## 2016-04-11 LAB — URINE MICROSCOPIC-ADD ON

## 2016-04-11 LAB — RAPID STREP SCREEN (MED CTR MEBANE ONLY): Streptococcus, Group A Screen (Direct): NEGATIVE

## 2016-04-11 LAB — PREGNANCY, URINE: PREG TEST UR: NEGATIVE

## 2016-04-11 NOTE — ED Notes (Signed)
Called for pt, no answer

## 2016-04-11 NOTE — ED Notes (Signed)
Patient c/o sore throat, dysuria, and a "knot on neck" pt c/o soreness to posterior neck.

## 2016-04-11 NOTE — Discharge Instructions (Signed)
Your strep test is negative. Your urine pregnancy test is negative. Your urine test is negative for acute infection. Your temperature is slightly above the normal at 100.1. Your examination suggest pharyngitis (sore throat) with upper respiratory infection. Please increase water, juices, Gatorade. Please use Tylenol every 4 hours, or ibuprofen every 6 hours. Please wash hands frequently. Use salt water gargles, and Chloraseptic spray for assistance with the discomfort. Please see your primary physician for recheck of your urine and the problem with dysuria. Pharyngitis Pharyngitis is a sore throat (pharynx). There is redness, pain, and swelling of your throat. HOME CARE   Drink enough fluids to keep your pee (urine) clear or pale yellow.  Only take medicine as told by your doctor.  You may get sick again if you do not take medicine as told. Finish your medicines, even if you start to feel better.  Do not take aspirin.  Rest.  Rinse your mouth (gargle) with salt water ( tsp of salt per 1 qt of water) every 1-2 hours. This will help the pain.  If you are not at risk for choking, you can suck on hard candy or sore throat lozenges. GET HELP IF:  You have large, tender lumps on your neck.  You have a rash.  You cough up green, yellow-brown, or bloody spit. GET HELP RIGHT AWAY IF:   You have a stiff neck.  You drool or cannot swallow liquids.  You throw up (vomit) or are not able to keep medicine or liquids down.  You have very bad pain that does not go away with medicine.  You have problems breathing (not from a stuffy nose). MAKE SURE YOU:   Understand these instructions.  Will watch your condition.  Will get help right away if you are not doing well or get worse.   This information is not intended to replace advice given to you by your health care provider. Make sure you discuss any questions you have with your health care provider.   Document Released: 05/23/2008 Document  Revised: 09/25/2013 Document Reviewed: 08/12/2013 Elsevier Interactive Patient Education [employer]2016 Elsevier Inc.

## 2016-04-11 NOTE — ED Provider Notes (Signed)
CSN: [id number]     Arrival date & time 04/11/16  1836 History   First MD Initiated Contact with Patient 04/11/16 2026     Chief Complaint  Patient presents with  . Sore Throat     (Consider location/radiation/quality/duration/timing/severity/associated sxs/prior Treatment) Patient is a 20 y.o. female presenting with pharyngitis. The history is provided by the patient.  Sore Throat This is a new problem. Episode onset: onset 2 weeks ago, went away and came back. The problem occurs intermittently. The problem has been unchanged. Associated symptoms include a fever, headaches, a sore throat and urinary symptoms. Pertinent negatives include no nausea. The symptoms are aggravated by swallowing. She has tried nothing for the symptoms. The treatment provided no relief.    Past Medical History  Diagnosis Date  . Allergy   . Anxiety    Past Surgical History  Procedure Laterality Date  . Dental surgery     Family History  Problem Relation Age of Onset  . Arthritis Maternal Grandmother   . Stroke Maternal Grandmother   . Sudden death Maternal Grandmother   . Depression Mother   . Diabetes Mother    Social History  Substance Use Topics  . Smoking status: Light Tobacco Smoker -- 0.50 packs/day    Types: Cigarettes    Last Attempt to Quit: 12/19/2013  . Smokeless tobacco: None  . Alcohol Use: No   OB History    No data available     Review of Systems  Constitutional: Positive for fever.  HENT: Positive for sore throat.   Gastrointestinal: Negative for nausea.  Neurological: Positive for headaches.  All other systems reviewed and are negative.     Allergies  Review of patient's allergies indicates no known allergies.  Home Medications   Prior to Admission medications   Medication Sig Start Date End Date Taking? Authorizing Provider  citalopram (CELEXA) 20 MG tablet  11/09/14   Historical Provider, MD  glucose blood (ONETOUCH VERIO) test strip Use to test blood sugar 3  times daily as instructed. 11/25/14   Carlus Pavlovristina Gherghe, MD  naproxen (NAPROSYN) 500 MG tablet Take 1 tablet (500 mg total) by mouth 2 (two) times daily with a meal. Patient not taking: Reported on 04/02/2015 04/30/14   Tresa GarterAleksei Plotnikov V, MD  VYVANSE 20 MG capsule  03/12/15   Historical Provider, MD   BP 101/60 mmHg  Pulse 112  Temp(Src) 100.1 F (37.8 C) (Oral)  Resp 20  Ht 5\' 3"  (1.6 m)  Wt 53.071 kg  BMI 20.73 kg/m2  SpO2 100%  LMP 03/31/2016 Physical Exam  Constitutional: She is oriented to person, place, and time. She appears well-developed and well-nourished.  Non-toxic appearance.  HENT:  Head: Normocephalic.  Right Ear: Tympanic membrane and external ear normal.  Left Ear: Tympanic membrane and external ear normal.  Mild swelling of the uvula. Mild to mod increase redness of the posterior pharynx. No abscess, and no exudate. There is a small red area in the hair follicle area at the base of the neck. No abscess. Mild to mod tenderness.  Eyes: EOM and lids are normal. Pupils are equal, round, and reactive to light.  Neck: Normal range of motion. Neck supple. Carotid bruit is not present.  Cardiovascular: Normal rate, regular rhythm, normal heart sounds, intact distal pulses and normal pulses.   Pulmonary/Chest: Breath sounds normal. No respiratory distress.  Abdominal: Soft. Bowel sounds are normal. There is no tenderness. There is no guarding and no CVA tenderness.  Musculoskeletal: Normal range  of motion.  Lymphadenopathy:       Head (right side): No submandibular adenopathy present.       Head (left side): No submandibular adenopathy present.    She has no cervical adenopathy.  Neurological: She is alert and oriented to person, place, and time. She has normal strength. No cranial nerve deficit or sensory deficit.  Skin: Skin is warm and dry.  Psychiatric: She has a normal mood and affect. Her speech is normal.  Nursing note and vitals reviewed.   ED Course  Procedures  (including critical care time) Labs Review Labs Reviewed  URINALYSIS, ROUTINE W REFLEX MICROSCOPIC (NOT AT [facility]) - Abnormal; Notable for the following:    APPearance HAZY (*)    Hgb urine dipstick SMALL (*)    All other components within normal limits  URINE MICROSCOPIC-ADD ON - Abnormal; Notable for the following:    Squamous Epithelial / LPF 6-30 (*)    Bacteria, UA MANY (*)    All other components within normal limits  RAPID STREP SCREEN (NOT AT [facility])  CULTURE, GROUP A STREP [facility])  PREGNANCY, URINE    Imaging Review No results found. I have personally reviewed and evaluated these images and lab results as part of my medical decision-making.   EKG Interpretation None      MDM  Vital signs reviewed. Strep test negative. Preg test negative. UA neg for acute infection. Test result and management of pharyngitis discussed with the patient. Pt to follow up with the PCP.   Final diagnoses:  Pharyngitis  URI (upper respiratory infection)    *I have reviewed nursing notes, vital signs, and all appropriate lab and imaging results for this patient.[address] PA-C 04/12/16 2354  Benjiman Core, MD 04/18/16 [phone]

## 2016-04-14 LAB — CULTURE, GROUP A STREP (THRC)

## 2018-02-15 DIAGNOSIS — F329 Major depressive disorder, single episode, unspecified: Secondary | ICD-10-CM | POA: Diagnosis not present

## 2018-02-15 DIAGNOSIS — R41840 Attention and concentration deficit: Secondary | ICD-10-CM | POA: Diagnosis not present

## 2018-02-15 DIAGNOSIS — N926 Irregular menstruation, unspecified: Secondary | ICD-10-CM | POA: Diagnosis not present

## 2018-02-15 DIAGNOSIS — Z1389 Encounter for screening for other disorder: Secondary | ICD-10-CM | POA: Diagnosis not present

## 2018-02-15 DIAGNOSIS — R634 Abnormal weight loss: Secondary | ICD-10-CM | POA: Diagnosis not present

## 2018-02-27 DIAGNOSIS — N926 Irregular menstruation, unspecified: Secondary | ICD-10-CM | POA: Diagnosis not present

## 2018-02-27 DIAGNOSIS — R634 Abnormal weight loss: Secondary | ICD-10-CM | POA: Diagnosis not present

## 2018-02-27 DIAGNOSIS — R41840 Attention and concentration deficit: Secondary | ICD-10-CM | POA: Diagnosis not present

## 2018-02-27 DIAGNOSIS — F329 Major depressive disorder, single episode, unspecified: Secondary | ICD-10-CM | POA: Diagnosis not present

## 2018-04-16 DIAGNOSIS — R41840 Attention and concentration deficit: Secondary | ICD-10-CM | POA: Diagnosis not present

## 2018-04-16 DIAGNOSIS — N926 Irregular menstruation, unspecified: Secondary | ICD-10-CM | POA: Diagnosis not present

## 2018-04-16 DIAGNOSIS — F329 Major depressive disorder, single episode, unspecified: Secondary | ICD-10-CM | POA: Diagnosis not present

## 2018-04-16 DIAGNOSIS — R634 Abnormal weight loss: Secondary | ICD-10-CM | POA: Diagnosis not present

## 2018-11-04 ENCOUNTER — Encounter (HOSPITAL_COMMUNITY): Payer: Self-pay

## 2018-11-04 ENCOUNTER — Emergency Department (HOSPITAL_COMMUNITY): Payer: Self-pay

## 2018-11-04 ENCOUNTER — Other Ambulatory Visit: Payer: Self-pay

## 2018-11-04 ENCOUNTER — Emergency Department (HOSPITAL_COMMUNITY)
Admission: EM | Admit: 2018-11-04 | Discharge: 2018-11-04 | Disposition: A | Discharge status: Home / Self Care | Payer: Self-pay | Attending: Emergency Medicine | Admitting: Emergency Medicine

## 2018-11-04 DIAGNOSIS — R112 Nausea with vomiting, unspecified: Secondary | ICD-10-CM | POA: Insufficient documentation

## 2018-11-04 DIAGNOSIS — R197 Diarrhea, unspecified: Secondary | ICD-10-CM | POA: Insufficient documentation

## 2018-11-04 DIAGNOSIS — R1084 Generalized abdominal pain: Secondary | ICD-10-CM | POA: Insufficient documentation

## 2018-11-04 DIAGNOSIS — F17210 Nicotine dependence, cigarettes, uncomplicated: Secondary | ICD-10-CM | POA: Insufficient documentation

## 2018-11-04 DIAGNOSIS — Z79899 Other long term (current) drug therapy: Secondary | ICD-10-CM | POA: Insufficient documentation

## 2018-11-04 HISTORY — DX: Major depressive disorder, single episode, unspecified: F32.9

## 2018-11-04 HISTORY — DX: Depression, unspecified: F32.A

## 2018-11-04 LAB — URINALYSIS, ROUTINE W REFLEX MICROSCOPIC
BACTERIA UA: NONE SEEN
BILIRUBIN URINE: NEGATIVE
Glucose, UA: NEGATIVE mg/dL
KETONES UR: 5 mg/dL — AB
LEUKOCYTES UA: NEGATIVE
Nitrite: NEGATIVE
Protein, ur: NEGATIVE mg/dL
SPECIFIC GRAVITY, URINE: 1.029 (ref 1.005–1.030)
pH: 5 (ref 5.0–8.0)

## 2018-11-04 LAB — LIPASE, BLOOD: LIPASE: 21 U/L (ref 11–51)

## 2018-11-04 LAB — COMPREHENSIVE METABOLIC PANEL
ALK PHOS: 54 U/L (ref 38–126)
ALT: 9 U/L (ref 0–44)
AST: 21 U/L (ref 15–41)
Albumin: 4.7 g/dL (ref 3.5–5.0)
Anion gap: 11 (ref 5–15)
BUN: 14 mg/dL (ref 6–20)
CALCIUM: 8.9 mg/dL (ref 8.9–10.3)
CO2: 21 mmol/L — AB (ref 22–32)
Chloride: 104 mmol/L (ref 98–111)
Creatinine, Ser: 0.84 mg/dL (ref 0.44–1.00)
GFR calc non Af Amer: >60 mL/min (ref >60)
Glucose, Bld: 125 mg/dL — ABNORMAL HIGH (ref 70–99)
Potassium: 3.7 mmol/L (ref 3.5–5.1)
SODIUM: 136 mmol/L (ref 135–145)
Total Bilirubin: 1 mg/dL (ref 0.3–1.2)
Total Protein: 8.5 g/dL — ABNORMAL HIGH (ref 6.5–8.1)

## 2018-11-04 LAB — I-STAT BETA HCG BLOOD, ED (MC, WL, AP ONLY): HCG, QUANTITATIVE: 9.1 m[IU]/mL — AB (ref <5)

## 2018-11-04 LAB — CBC
HEMATOCRIT: 47.2 % — AB (ref 36.0–46.0)
Hemoglobin: 15.3 g/dL — ABNORMAL HIGH (ref 12.0–15.0)
MCH: 29.8 pg (ref 26.0–34.0)
MCHC: 32.4 g/dL (ref 30.0–36.0)
MCV: 92 fL (ref 80.0–100.0)
NRBC: 0 % (ref 0.0–0.2)
Platelets: 324 10*3/uL (ref 150–400)
RBC: 5.13 MIL/uL — ABNORMAL HIGH (ref 3.87–5.11)
RDW: 12.6 % (ref 11.5–15.5)
WBC: 16.2 10*3/uL — AB (ref 4.0–10.5)

## 2018-11-04 MED ORDER — ONDANSETRON HCL 4 MG PO TABS: 4.0000 mg | ORAL_TABLET | Freq: Four times a day (QID) | ORAL | 0 refills | Status: DC

## 2018-11-04 MED ORDER — ONDANSETRON HCL 4 MG/2ML IJ SOLN
4.0000 mg | Freq: Once | INTRAMUSCULAR | Status: AC
  Administered 2018-11-04: 4 mg via INTRAVENOUS
  Filled 2018-11-04: qty 2

## 2018-11-04 MED ORDER — IOPAMIDOL (ISOVUE-300) INJECTION 61%
30.0000 mL | Freq: Once | INTRAVENOUS | Status: AC | PRN
  Administered 2018-11-04: 30 mL via ORAL

## 2018-11-04 MED ORDER — IOPAMIDOL (ISOVUE-300) INJECTION 61%
100.0000 mL | Freq: Once | INTRAVENOUS | Status: AC | PRN
  Administered 2018-11-04: 100 mL via INTRAVENOUS

## 2018-11-04 MED ORDER — SODIUM CHLORIDE 0.9 % IV BOLUS
2000.0000 mL | Freq: Once | INTRAVENOUS | Status: AC
  Administered 2018-11-04: 2000 mL via INTRAVENOUS

## 2018-11-04 NOTE — ED Triage Notes (Signed)
Pt reports she has been vomiting and diarrhea for 2 days

## 2018-11-04 NOTE — ED Provider Notes (Signed)
[facility] EMERGENCY DEPARTMENT Provider Note   CSN: [id number] Arrival date & time: 11/04/18  1805     History   Chief Complaint Chief Complaint  Patient presents with  . Emesis    HPI Madeline Mccormick is a 22 y.o. female.  HPI Patient presents with 2 days of continuous vomiting and diarrhea.  She is having episodic abdominal cramps which are worse after trying to drink water.  Denies any blood in the vomit or stool.  She has had subjective fevers and chills.  States that her mother had similar symptoms.  Denies urinary symptoms. Past Medical History:  Diagnosis Date  . Allergy   . Anxiety   . Depression     Patient Active Problem List   Diagnosis Date Noted  . Loss of weight 11/18/2014  . Dysuria 11/18/2014  . Prediabetes 11/18/2014  . Depressive disorder, not elsewhere classified 05/04/2014  . Trochanteric bursitis of right hip 04/30/2014  . Upper respiratory infection 02/10/2014  . Well adult 12/31/2013  . Birth control counseling 12/31/2013  . Vaginal discharge 12/31/2013  . Situational anxiety 10/08/2013    Past Surgical History:  Procedure Laterality Date  . DENTAL SURGERY       OB History   None      Home Medications    Prior to Admission medications   Medication Sig Start Date End Date Taking? Authorizing Provider  escitalopram (LEXAPRO) 10 MG tablet Take 10 mg by mouth daily.   Yes [provider]  lisdexamfetamine (VYVANSE) 30 MG capsule Take 30 mg by mouth daily.   Yes [provider]  glucose blood (ONETOUCH VERIO) test strip Use to test blood sugar 3 times daily as instructed. 11/25/14   Carlus Pavlov, MD  naproxen (NAPROSYN) 500 MG tablet Take 1 tablet (500 mg total) by mouth 2 (two) times daily with a meal. Patient not taking: Reported on 04/02/2015 04/30/14   Plotnikov, Georgina Quint, MD  ondansetron (ZOFRAN) 4 MG tablet Take 1 tablet (4 mg total) by mouth every 6 (six) hours. 11/04/18   Donnetta Hutching, MD  VYVANSE 20 MG  capsule  03/12/15   [provider]    Family History Family History  Problem Relation Age of Onset  . Arthritis Maternal Grandmother   . Stroke Maternal Grandmother   . Sudden death Maternal Grandmother   . Depression Mother   . Diabetes Mother     Social History Social History   Tobacco Use  . Smoking status: Light Tobacco Smoker    Packs/day: 0.50    Types: Cigarettes    Last attempt to quit: 12/19/2013    Years since quitting: 4.8  Substance Use Topics  . Alcohol use: No    Alcohol/week: 0.0 standard drinks  . Drug use: No     Allergies   Patient has no known allergies.   Review of Systems Review of Systems  Constitutional: Positive for chills and fatigue.  HENT: Negative for sore throat.   Respiratory: Negative for cough and shortness of breath.   Cardiovascular: Negative for chest pain.  Gastrointestinal: Positive for abdominal pain, diarrhea, nausea and vomiting. Negative for blood in stool.  Genitourinary: Negative for dysuria, flank pain and pelvic pain.  Musculoskeletal: Positive for back pain and myalgias. Negative for neck pain and neck stiffness.  Skin: Negative for rash and wound.  Neurological: Negative for dizziness, weakness, light-headedness, numbness and headaches.  All other systems reviewed and are negative.    Physical Exam Updated Vital Signs BP Marland Kitchen)  92/48   Pulse 84   Temp 98 F (36.7 C) (Oral)   Resp 16   LMP 10/04/2018 Comment: Pt reports 15 days late. Negative preg test  SpO2 100%   Physical Exam  Constitutional: She is oriented to person, place, and time. She appears well-developed and well-nourished. No distress.  HENT:  Head: Normocephalic and atraumatic.  Mouth/Throat: Oropharynx is clear and moist. No oropharyngeal exudate.  Eyes: Pupils are equal, round, and reactive to light. EOM are normal.  Neck: Normal range of motion. Neck supple.  Cardiovascular: Normal rate and regular rhythm.  Pulmonary/Chest: Effort  normal and breath sounds normal. No stridor. No respiratory distress. She has no wheezes. She has no rales. She exhibits no tenderness.  Abdominal: Soft. Bowel sounds are normal. There is tenderness. There is no rebound and no guarding.  Mild diffuse abdominal tenderness to palpation.  No rebound or guarding.  Musculoskeletal: Normal range of motion. She exhibits no edema or tenderness.  No midline thoracic lumbar tenderness.  No definite CVA tenderness.  Patient does have some bilateral lumbar paraspinal tenderness to palpation.  Neurological: She is alert and oriented to person, place, and time.  Moving all extremities without focal deficit.  Sensation fully intact.  Skin: Skin is warm and dry. Capillary refill takes less than 2 seconds. No rash noted. She is not diaphoretic. No erythema.  Psychiatric: She has a normal mood and affect. Her behavior is normal.  Nursing note and vitals reviewed.    ED Treatments / Results  Labs (all labs ordered are listed, but only abnormal results are displayed) Labs Reviewed  COMPREHENSIVE METABOLIC PANEL - Abnormal; Notable for the following components:      Result Value   CO2 21 (*)    Glucose, Bld 125 (*)    Total Protein 8.5 (*)    All other components within normal limits  CBC - Abnormal; Notable for the following components:   WBC 16.2 (*)    RBC 5.13 (*)    Hemoglobin 15.3 (*)    HCT 47.2 (*)    All other components within normal limits  URINALYSIS, ROUTINE W REFLEX MICROSCOPIC - Abnormal; Notable for the following components:   APPearance HAZY (*)    Hgb urine dipstick SMALL (*)    Ketones, ur 5 (*)    All other components within normal limits  I-STAT BETA HCG BLOOD, ED (MC, WL, AP ONLY) - Abnormal; Notable for the following components:   I-stat hCG, quantitative 9.1 (*)    All other components within normal limits  LIPASE, BLOOD    EKG None  Radiology Ct Abdomen Pelvis W Contrast  Result Date: 11/04/2018 CLINICAL DATA:   Generalized abdominal pain, fever EXAM: CT ABDOMEN AND PELVIS WITH CONTRAST TECHNIQUE: Multidetector CT imaging of the abdomen and pelvis was performed using the standard protocol following bolus administration of intravenous contrast. CONTRAST:  ISOVUE-300 IOPAMIDOL (ISOVUE-300) INJECTION 61% COMPARISON:  None. FINDINGS: Lower chest: Lung bases are clear. No effusions. Heart is normal size. Hepatobiliary: No focal hepatic abnormality. Gallbladder unremarkable. Pancreas: No focal abnormality or ductal dilatation. Spleen: No focal abnormality.  Normal size. Adrenals/Urinary Tract: No adrenal abnormality. No focal renal abnormality. No stones or hydronephrosis. Urinary bladder is unremarkable. Stomach/Bowel: Appendix is visualized and is normal. Stomach, large and small bowel grossly unremarkable. Vascular/Lymphatic: No evidence of aneurysm or adenopathy. Reproductive: Uterus and adnexa unremarkable.  No mass. Other: No free fluid or free air. Musculoskeletal: No acute bony abnormality. IMPRESSION: No acute findings in  the abdomen or pelvis. Electronically Signed   By: Charlett NoseKevin  Dover M.D.   On: 11/04/2018 22:39    Procedures Procedures (including critical care time)  Medications Ordered in ED Medications  sodium chloride 0.9 % bolus 2,000 mL (0 mLs Intravenous Stopped 11/04/18 2129)  ondansetron (ZOFRAN) injection 4 mg (4 mg Intravenous Given 11/04/18 1851)  iopamidol (ISOVUE-300) 61 % injection 100 mL (100 mLs Intravenous Contrast Given 11/04/18 2212)  iopamidol (ISOVUE-300) 61 % injection 30 mL (30 mLs Oral Contrast Given 11/04/18 2212)     Initial Impression / Assessment and Plan / ED Course  I have reviewed the triage vital signs and the nursing notes.  Pertinent labs & imaging results that were available during my care of the patient were reviewed by me and considered in my medical decision making (see chart for details).    Signed out to emergency provider pending CT abdomen and  pelvis.   Final Clinical Impressions(s) / ED Diagnoses   Final diagnoses:  Nausea vomiting and diarrhea    ED Discharge Orders         Ordered    ondansetron (ZOFRAN) 4 MG tablet  Every 6 hours     11/04/18 2306           Loren RacerYelverton, Manveer Gomes, MD 11/05/18 1318

## 2018-11-04 NOTE — Discharge Instructions (Signed)
Glucose is slightly elevated (125).  Additionally, your pregnancy test is weakly positive.  Both of these will need to be rechecked this week.  Increase fluids.  Prescription for nausea medicine.

## 2018-11-04 NOTE — ED Provider Notes (Signed)
Patient rechecked prior to discharge.  Discussed her slightly elevated glucose and positive quantitative hCG.  She will have these tests rechecked this week.  She and her mother agree.  Discharge medications Zofran 4 mg   Donnetta Hutchingook, Beula Joyner, MD 11/04/18 2307

## 2018-12-19 NOTE — L&D Delivery Note (Addendum)
Delivery Note  Madeline Mccormick is a 23 y.o. G3P0020 s/p SVD at [redacted]w[redacted]d. She was admitted for SOL and gHTN.  ROM: 7h 37m with clear fluid GBS Status:  Negative/-- (10/13 1529) Maximum Maternal Temperature: 98.3 F  Labor Progress: . Patient presented to L&D for early labor. Initial SVE: 3/100/-1. She was then augmented with pitocin and AROM and progressed to complete.   Delivery Date/Time: 10/16/2019 at 1107 Delivery: Called to room and patient was complete and pushing. Head delivered ROA. Delivered through tight nuchal cord x1.  Shoulder and body delivered in usual fashion. Infant with spontaneous cry, placed on mother's abdomen, dried and stimulated.Cord clamped x 2 after 1-minute delay, and cut by mother. Cord blood drawn. Placenta delivered spontaneously with gentle cord traction. Fundus firm with massage and Pitocin. Labia, perineum, vagina, and cervix inspected inspected with 1st degree laceration of mother's left labia and vagina. Repaired in usual fashion with 4-0 monocryl.  Baby taken to warmer with brief intervention of PPV with improvement in APGARS.   Baby Weight: 3125 grams Placenta: Sent to L&D Complications: Nuchal cord x1  Lacerations: 1st degree laceration of left labia and vagina  EBL: 631 Analgesia: Epidural  Infant: APGAR (1 MIN): 6   APGAR (5 MINS): 7   APGAR (10 MINS): [place], MD, PGY1  [facility] [place] [facility] 10/16/2019, 11:55 AM    OB FELLOW ATTESTATION  I was present, gloved, and supervising throughout delivery and have edited the above note to reflect any changes.   Augustin Coupe, MD/[facility] OB Fellow  10/16/2019, 12:34 PM

## 2019-01-24 DIAGNOSIS — R41840 Attention and concentration deficit: Secondary | ICD-10-CM | POA: Diagnosis not present

## 2019-01-24 DIAGNOSIS — Z6822 Body mass index (BMI) 22.0-22.9, adult: Secondary | ICD-10-CM | POA: Diagnosis not present

## 2019-01-24 DIAGNOSIS — N926 Irregular menstruation, unspecified: Secondary | ICD-10-CM | POA: Diagnosis not present

## 2019-01-24 DIAGNOSIS — F331 Major depressive disorder, recurrent, moderate: Secondary | ICD-10-CM | POA: Diagnosis not present

## 2019-02-19 DIAGNOSIS — Z6822 Body mass index (BMI) 22.0-22.9, adult: Secondary | ICD-10-CM | POA: Diagnosis not present

## 2019-02-19 DIAGNOSIS — Z3490 Encounter for supervision of normal pregnancy, unspecified, unspecified trimester: Secondary | ICD-10-CM | POA: Diagnosis not present

## 2019-02-19 DIAGNOSIS — Z3201 Encounter for pregnancy test, result positive: Secondary | ICD-10-CM | POA: Diagnosis not present

## 2019-03-06 ENCOUNTER — Inpatient Hospital Stay (HOSPITAL_COMMUNITY)
Admission: EM | Admit: 2019-03-06 | Discharge: 2019-03-06 | Disposition: A | Discharge status: Home / Self Care | Payer: BC Managed Care – PPO | Attending: Obstetrics and Gynecology | Admitting: Obstetrics and Gynecology

## 2019-03-06 ENCOUNTER — Other Ambulatory Visit: Payer: Self-pay

## 2019-03-06 ENCOUNTER — Inpatient Hospital Stay (HOSPITAL_COMMUNITY): Payer: BC Managed Care – PPO

## 2019-03-06 ENCOUNTER — Encounter (HOSPITAL_COMMUNITY): Payer: Self-pay

## 2019-03-06 DIAGNOSIS — R21 Rash and other nonspecific skin eruption: Secondary | ICD-10-CM | POA: Insufficient documentation

## 2019-03-06 DIAGNOSIS — O26899 Other specified pregnancy related conditions, unspecified trimester: Secondary | ICD-10-CM | POA: Diagnosis present

## 2019-03-06 DIAGNOSIS — J029 Acute pharyngitis, unspecified: Secondary | ICD-10-CM | POA: Diagnosis not present

## 2019-03-06 DIAGNOSIS — O23591 Infection of other part of genital tract in pregnancy, first trimester: Secondary | ICD-10-CM | POA: Insufficient documentation

## 2019-03-06 DIAGNOSIS — O9989 Other specified diseases and conditions complicating pregnancy, childbirth and the puerperium: Secondary | ICD-10-CM | POA: Insufficient documentation

## 2019-03-06 DIAGNOSIS — O219 Vomiting of pregnancy, unspecified: Secondary | ICD-10-CM | POA: Diagnosis not present

## 2019-03-06 DIAGNOSIS — R109 Unspecified abdominal pain: Secondary | ICD-10-CM | POA: Insufficient documentation

## 2019-03-06 DIAGNOSIS — O99331 Smoking (tobacco) complicating pregnancy, first trimester: Secondary | ICD-10-CM | POA: Diagnosis not present

## 2019-03-06 DIAGNOSIS — Z3A01 Less than 8 weeks gestation of pregnancy: Secondary | ICD-10-CM | POA: Insufficient documentation

## 2019-03-06 DIAGNOSIS — B9689 Other specified bacterial agents as the cause of diseases classified elsewhere: Secondary | ICD-10-CM | POA: Diagnosis not present

## 2019-03-06 DIAGNOSIS — O26891 Other specified pregnancy related conditions, first trimester: Secondary | ICD-10-CM | POA: Insufficient documentation

## 2019-03-06 DIAGNOSIS — F17210 Nicotine dependence, cigarettes, uncomplicated: Secondary | ICD-10-CM | POA: Insufficient documentation

## 2019-03-06 DIAGNOSIS — M549 Dorsalgia, unspecified: Secondary | ICD-10-CM | POA: Diagnosis not present

## 2019-03-06 DIAGNOSIS — R197 Diarrhea, unspecified: Secondary | ICD-10-CM | POA: Diagnosis not present

## 2019-03-06 DIAGNOSIS — N760 Acute vaginitis: Secondary | ICD-10-CM

## 2019-03-06 LAB — HCG, QUANTITATIVE, PREGNANCY: hCG, Beta Chain, Quant, S: 158952 m[IU]/mL — ABNORMAL HIGH (ref <5)

## 2019-03-06 LAB — URINALYSIS, ROUTINE W REFLEX MICROSCOPIC
Bacteria, UA: NONE SEEN
Bilirubin Urine: NEGATIVE
Glucose, UA: NEGATIVE mg/dL
Ketones, ur: NEGATIVE mg/dL
Leukocytes,Ua: NEGATIVE
Nitrite: NEGATIVE
Protein, ur: NEGATIVE mg/dL
Specific Gravity, Urine: 1.014 (ref 1.005–1.030)
pH: 6 (ref 5.0–8.0)

## 2019-03-06 LAB — ABO/RH: ABO/RH(D): A POS

## 2019-03-06 LAB — WET PREP, GENITAL
Sperm: NONE SEEN
Trich, Wet Prep: NONE SEEN
Yeast Wet Prep HPF POC: NONE SEEN

## 2019-03-06 LAB — CBC
HCT: 34.9 % — ABNORMAL LOW (ref 36.0–46.0)
Hemoglobin: 11.7 g/dL — ABNORMAL LOW (ref 12.0–15.0)
MCH: 30.3 pg (ref 26.0–34.0)
MCHC: 33.5 g/dL (ref 30.0–36.0)
MCV: 90.4 fL (ref 80.0–100.0)
NRBC: 0 % (ref 0.0–0.2)
PLATELETS: 239 10*3/uL (ref 150–400)
RBC: 3.86 MIL/uL — AB (ref 3.87–5.11)
RDW: 12.6 % (ref 11.5–15.5)
WBC: 8.6 10*3/uL (ref 4.0–10.5)

## 2019-03-06 LAB — POCT PREGNANCY, URINE: Preg Test, Ur: POSITIVE — AB

## 2019-03-06 MED ORDER — ONDANSETRON 4 MG PO TBDP
8.0000 mg | ORAL_TABLET | Freq: Once | ORAL | Status: AC
  Administered 2019-03-06: 8 mg via ORAL
  Filled 2019-03-06: qty 2

## 2019-03-06 MED ORDER — METRONIDAZOLE 0.75 % VA GEL: 1.0000 | Freq: Every day | VAGINAL | 0 refills | Status: AC

## 2019-03-06 MED ORDER — ONDANSETRON 8 MG PO TBDP: 8.0000 mg | ORAL_TABLET | Freq: Three times a day (TID) | ORAL | 0 refills | Status: DC | PRN

## 2019-03-06 MED ORDER — PROMETHAZINE HCL 12.5 MG PO TABS: 12.5000 mg | ORAL_TABLET | Freq: Four times a day (QID) | ORAL | 0 refills | Status: DC | PRN

## 2019-03-06 NOTE — MAU Provider Note (Signed)
History     CSN: [id number]  Arrival date and time: 03/06/19 1218   First Provider Initiated Contact with Patient 03/06/19 1419      Chief Complaint  Patient presents with  . Nausea  . Abdominal Pain  . Back Pain  . Possible Pregnancy   HPI  Ms.  Madeline Mccormick is a 23 y.o. year old G76P0020 female at [redacted]w[redacted]d weeks gestation by dates who presents to MAU reporting sore throat, vomiting x 6, 2 episodes of loose stools, sharp lower abdominal & back pain. She reports she had a (+) UPT with her PCP. She also complains of a "rash" on her upper inner RT thigh that appeared after she last shaved. She thinks it "looks like bites of some sort; itches and hurts."  Past Medical History:  Diagnosis Date  . Allergy   . Anxiety   . Depression     Past Surgical History:  Procedure Laterality Date  . DENTAL SURGERY    . THERAPEUTIC ABORTION      Family History  Problem Relation Age of Onset  . Arthritis Maternal Grandmother   . Stroke Maternal Grandmother   . Sudden death Maternal Grandmother   . Depression Mother   . Diabetes Mother     Social History   Tobacco Use  . Smoking status: Light Tobacco Smoker    Packs/day: 0.50    Types: Cigarettes    Last attempt to quit: 12/19/2013    Years since quitting: 5.2  . Smokeless tobacco: Never Used  Substance Use Topics  . Alcohol use: No    Alcohol/week: 0.0 standard drinks  . Drug use: No    Allergies: No Known Allergies  No medications prior to admission.    Review of Systems  Constitutional: Negative.   HENT: Negative.   Eyes: Negative.   Respiratory: Negative.   Cardiovascular: Negative.   Gastrointestinal: Positive for diarrhea, nausea and vomiting.  Endocrine: Negative.   Genitourinary: Positive for pelvic pain (sharp).  Musculoskeletal: Positive for back pain.  Skin: Positive for rash (on upper inner RT thigh).  Allergic/Immunologic: Negative.   Neurological: Negative.   Hematological: Negative.    Psychiatric/Behavioral: Negative.    Physical Exam   Blood pressure (!) 103/55, pulse 70, temperature 97.9 F (36.6 C), temperature source Oral, resp. rate 17, height  (1.626 m), weight 58.3 kg, last menstrual period 01/08/2019, SpO2 98 %.  Physical Exam  Nursing note and vitals reviewed. Constitutional: She is oriented to person, place, and time. She appears well-developed and well-nourished.  HENT:  Head: Normocephalic and atraumatic.  Eyes: Pupils are equal, round, and reactive to light.  Neck: Normal range of motion.  Cardiovascular: Normal rate.  Respiratory: Effort normal and breath sounds normal.  GI: Soft. Bowel sounds are normal.  Genitourinary:    Genitourinary Comments: Uterus: non-tender, SE: cervix is smooth, pink, no lesions, small amt of thick, white vaginal d/c -- WP, GC/CT done, closed/long/firm, no CMT or friability, no adnexal tenderness    Musculoskeletal: Normal range of motion.  Neurological: She is alert and oriented to person, place, and time.  Skin: Skin is warm and dry. Rash noted.  Diffuse small red bumps on RT gluteal fold, buttock and upper, inner thigh  Psychiatric: She has a normal mood and affect. Her behavior is normal. Judgment and thought content normal.    MAU Course  Procedures  MDM CCUA UPT CBC ABO/Rh HCG Wet Prep GC/CT -- pending HIV -- pending OB < 14 wks Complete U/S  Zofran 8 mg ODT -- improved nausea  Results for orders placed or performed during the hospital encounter of 03/06/19 (from the past 24 hour(s))  Urinalysis, Routine w reflex microscopic     Status: Abnormal   Collection Time: 03/06/19  1:16 PM  Result Value Ref Range   Color, Urine YELLOW YELLOW   APPearance HAZY (A) CLEAR   Specific Gravity, Urine 1.014 1.005 - 1.030   pH 6.0 5.0 - 8.0   Glucose, UA NEGATIVE NEGATIVE mg/dL   Hgb urine dipstick SMALL (A) NEGATIVE   Bilirubin Urine NEGATIVE NEGATIVE   Ketones, ur NEGATIVE NEGATIVE mg/dL   Protein, ur  NEGATIVE NEGATIVE mg/dL   Nitrite NEGATIVE NEGATIVE   Leukocytes,Ua NEGATIVE NEGATIVE   RBC / HPF 0-5 0 - 5 RBC/hpf   WBC, UA 0-5 0 - 5 WBC/hpf   Bacteria, UA NONE SEEN NONE SEEN   Squamous Epithelial / LPF 6-10 0 - 5  Pregnancy, urine POC     Status: Abnormal   Collection Time: 03/06/19  2:19 PM  Result Value Ref Range   Preg Test, Ur POSITIVE (A) NEGATIVE  Wet prep, genital     Status: Abnormal   Collection Time: 03/06/19  2:35 PM  Result Value Ref Range   Yeast Wet Prep HPF POC NONE SEEN NONE SEEN   Trich, Wet Prep NONE SEEN NONE SEEN   Clue Cells Wet Prep HPF POC PRESENT (A) NONE SEEN   WBC, Wet Prep HPF POC MODERATE (A) NONE SEEN   Sperm NONE SEEN   CBC     Status: Abnormal   Collection Time: 03/06/19  2:47 PM  Result Value Ref Range   WBC 8.6 4.0 - 10.5 K/uL   RBC 3.86 (L) 3.87 - 5.11 MIL/uL   Hemoglobin 11.7 (L) 12.0 - 15.0 g/dL   HCT 48.1 (L) 85.6 - 31.4 %   MCV 90.4 80.0 - 100.0 fL   MCH 30.3 26.0 - 34.0 pg   MCHC 33.5 30.0 - 36.0 g/dL   RDW 97.0 26.3 - 78.5 %   Platelets 239 150 - 400 K/uL   nRBC 0.0 0.0 - 0.2 %  ABO/Rh     Status: None   Collection Time: 03/06/19  2:47 PM  Result Value Ref Range   ABO/RH(D) A POS    No rh immune globuloin      NOT A RH IMMUNE GLOBULIN CANDIDATE, PT RH POSITIVE Performed at [facility], [address], [place], [place] [address]   hCG, quantitative, pregnancy     Status: Abnormal   Collection Time: 03/06/19  2:47 PM  Result Value Ref Range   hCG, Beta Chain, Quant, S 158,952 (H) <5 mIU/mL   US Ob Comp Less 14 Wks  Result Date: 03/06/2019 CLINICAL DATA:  Abdominal pain affecting first trimester of pregnancy; EGA [redacted] weeks 1 day by LMP of 12/20/2018 EXAM: OBSTETRIC <14 WK ULTRASOUND TECHNIQUE: Transabdominal ultrasound was performed for evaluation of the gestation as well as the maternal uterus and adnexal regions. Patient refused transvaginal imaging. COMPARISON:  None FINDINGS: Intrauterine gestational sac: Present,  single Yolk sac:  Present Embryo:  Present Cardiac Activity: Present Heart Rate: 154 bpm CRL:   10.1 mm   7 w 1 d                  [place] EDC: 10/22/2019 Subchorionic hemorrhage:  None visualized Maternal uterus/adnexae: RIGHT ovary measures 3.6 x 3.1 x 2.5 cm and contains a small corpus luteum. LEFT  ovary normal size and morphology, 2.7 x 1.2 x 1.8 cm. Small amount of nonspecific free pelvic fluid in RIGHT adnexa. No adnexal masses. IMPRESSION: Single live intrauterine gestation at 7 weeks 1 day EGA by crown-rump length. Electronically Signed   By: Ulyses Southward M.D.   On: 03/06/2019 16:34    Assessment and Plan  Bacterial vaginosis  Abdominal pain affecting pregnancy   - Advised to start [facility] ASAP - Rx for Phenergan 12.5 mg and Zofran 8 mg ODT sent - Information provided on 1st trimester pregnancy  - Discharge patient - List of [facility] OB provider given - Patient verbalized an understanding of the plan of care and agrees.    Raelyn Mora, MSN, CNM 03/06/2019, 2:35 PM

## 2019-03-06 NOTE — Discharge Instructions (Signed)
[facility] Prenatal [facility] Providers °         °[facility]  ° Phone: [phone] ° °[facility]  ° Phone: [phone] ° °[facility]      ° Phone: [phone]   °         °[facility] [place]    ° Phone: [phone] °         °[facility] [place]  ° Phone: [phone] ° °[facility] ° Phone: [phone] °    °[facility] ° Phone: [phone] ° °

## 2019-03-06 NOTE — MAU Note (Signed)
Woke up, was feeling bed.  Throat was sore, started throwing up(dry heaves) and loose stools x2.  Then started having sharp pains in lower abd and back.  preg confirmed at primary dr. About a wk ago, noted a "rash", upper inner rt thigh into groin and perineum.  Looks like bites, itches and hurts.

## 2019-03-07 LAB — GC/CHLAMYDIA PROBE AMP (~~LOC~~) NOT AT ARMC
Chlamydia: NEGATIVE
Neisseria Gonorrhea: NEGATIVE

## 2019-03-07 LAB — HIV ANTIBODY (ROUTINE TESTING W REFLEX): HIV Screen 4th Generation wRfx: NONREACTIVE

## 2019-03-12 ENCOUNTER — Telehealth: Payer: Self-pay | Admitting: Obstetrics & Gynecology

## 2019-03-12 NOTE — Telephone Encounter (Signed)
The patient called to start care, informed of our COV19 restirctions.

## 2019-04-09 ENCOUNTER — Other Ambulatory Visit: Payer: Self-pay

## 2019-04-09 ENCOUNTER — Ambulatory Visit (INDEPENDENT_AMBULATORY_CARE_PROVIDER_SITE_OTHER): Payer: Self-pay | Admitting: *Deleted

## 2019-04-09 DIAGNOSIS — R7303 Prediabetes: Secondary | ICD-10-CM

## 2019-04-09 DIAGNOSIS — Z3490 Encounter for supervision of normal pregnancy, unspecified, unspecified trimester: Secondary | ICD-10-CM | POA: Insufficient documentation

## 2019-04-09 DIAGNOSIS — F329 Major depressive disorder, single episode, unspecified: Secondary | ICD-10-CM

## 2019-04-09 DIAGNOSIS — F32A Depression, unspecified: Secondary | ICD-10-CM

## 2019-04-09 NOTE — Progress Notes (Signed)
I connected with  [place] Colter on 04/09/19 at  1:15 PM EDT by telephone and verified that I am speaking with the correct person using two identifiers.   I discussed the limitations, risks, security and privacy concerns of performing an evaluation and management service by telephone and the availability of in person appointments. I also discussed with the patient that there may be a patient responsible charge related to this service. The patient expressed understanding and agreed to proceed.  New Ob intake interview completed. Last pap done 2015. Pt advised that her prenatal care will include a combination of face to face visits as well as some virtual or telephone visits. Her next appt in office w/provider is 5/6 @ 1455. She will have labs and Pap at that time. Pt voiced understanding of all information and instructions given.   Kenyen Candy, Drucilla Schmidt, RN 04/09/2019  1:15 PM

## 2019-04-10 ENCOUNTER — Encounter: Payer: Self-pay | Admitting: *Deleted

## 2019-04-10 NOTE — Addendum Note (Signed)
Addended by: Jill Side on: 04/10/2019 09:49 AM   Modules accepted: Orders

## 2019-04-11 NOTE — Progress Notes (Signed)
I have reviewed this chart and agree with the RN/CMA assessment and management.    Eyad Rochford C Terryann Verbeek, MD, FACOG Attending Physician, [facility]  

## 2019-04-19 ENCOUNTER — Other Ambulatory Visit: Payer: Medicaid Other

## 2019-04-19 ENCOUNTER — Other Ambulatory Visit: Payer: Self-pay

## 2019-04-19 DIAGNOSIS — Z3490 Encounter for supervision of normal pregnancy, unspecified, unspecified trimester: Secondary | ICD-10-CM

## 2019-04-19 DIAGNOSIS — R7303 Prediabetes: Secondary | ICD-10-CM

## 2019-04-22 LAB — HEMOGLOBINOPATHY EVALUATION
Ferritin: 104 ng/mL (ref 15–150)
Hgb A2 Quant: 2.2 % (ref 1.8–3.2)
Hgb A: 97.8 % (ref 96.4–98.8)
Hgb C: 0 %
Hgb F Quant: 0 % (ref 0.0–2.0)
Hgb S: 0 %
Hgb Solubility: NEGATIVE
Hgb Variant: 0 %

## 2019-04-22 LAB — OBSTETRIC PANEL, INCLUDING HIV
Antibody Screen: NEGATIVE
Basophils Absolute: 0 10*3/uL (ref 0.0–0.2)
Basos: 0 %
EOS (ABSOLUTE): 0.2 10*3/uL (ref 0.0–0.4)
Eos: 3 %
HIV Screen 4th Generation wRfx: NONREACTIVE
Hematocrit: 36 % (ref 34.0–46.6)
Hemoglobin: 12.1 g/dL (ref 11.1–15.9)
Hepatitis B Surface Ag: NEGATIVE
Immature Grans (Abs): 0 10*3/uL (ref 0.0–0.1)
Immature Granulocytes: 0 %
Lymphocytes Absolute: 1.7 10*3/uL (ref 0.7–3.1)
Lymphs: 20 %
MCH: 31.2 pg (ref 26.6–33.0)
MCHC: 33.6 g/dL (ref 31.5–35.7)
MCV: 93 fL (ref 79–97)
Monocytes Absolute: 0.5 10*3/uL (ref 0.1–0.9)
Monocytes: 6 %
Neutrophils Absolute: 5.8 10*3/uL (ref 1.4–7.0)
Neutrophils: 71 %
Platelets: 318 10*3/uL (ref 150–450)
RBC: 3.88 x10E6/uL (ref 3.77–5.28)
RDW: 13.7 % (ref 11.7–15.4)
RPR Ser Ql: NONREACTIVE
Rh Factor: POSITIVE
Rubella Antibodies, IGG: 3.18 index (ref >0.99)
WBC: 8.1 10*3/uL (ref 3.4–10.8)

## 2019-04-22 LAB — GLUCOSE TOLERANCE, 2 HOURS W/ 1HR
Glucose, 1 hour: 142 mg/dL (ref 65–179)
Glucose, 2 hour: 101 mg/dL (ref 65–152)
Glucose, Fasting: 77 mg/dL (ref 65–91)

## 2019-04-24 ENCOUNTER — Telehealth: Payer: Self-pay | Admitting: Obstetrics and Gynecology

## 2019-04-24 ENCOUNTER — Encounter: Payer: Self-pay | Admitting: Obstetrics & Gynecology

## 2019-04-24 ENCOUNTER — Ambulatory Visit (INDEPENDENT_AMBULATORY_CARE_PROVIDER_SITE_OTHER): Payer: BC Managed Care – PPO | Admitting: Obstetrics & Gynecology

## 2019-04-24 ENCOUNTER — Other Ambulatory Visit (HOSPITAL_COMMUNITY)
Admission: RE | Admit: 2019-04-24 | Discharge: 2019-04-24 | Disposition: A | Discharge status: Home / Self Care | Payer: Medicaid Other | Source: Ambulatory Visit | Attending: Obstetrics & Gynecology | Admitting: Obstetrics & Gynecology

## 2019-04-24 ENCOUNTER — Other Ambulatory Visit: Payer: Self-pay

## 2019-04-24 VITALS — BP 114/72 | HR 71 | Wt 127.0 lb

## 2019-04-24 DIAGNOSIS — Z3492 Encounter for supervision of normal pregnancy, unspecified, second trimester: Secondary | ICD-10-CM | POA: Insufficient documentation

## 2019-04-24 DIAGNOSIS — Z23 Encounter for immunization: Secondary | ICD-10-CM

## 2019-04-24 DIAGNOSIS — Z3A14 14 weeks gestation of pregnancy: Secondary | ICD-10-CM

## 2019-04-24 LAB — POCT URINALYSIS DIP (DEVICE)
Bilirubin Urine: NEGATIVE
Glucose, UA: NEGATIVE mg/dL
Hgb urine dipstick: NEGATIVE
Ketones, ur: NEGATIVE mg/dL
Leukocytes,Ua: NEGATIVE
Nitrite: NEGATIVE
Protein, ur: NEGATIVE mg/dL
Specific Gravity, Urine: <=1.005 (ref 1.005–1.030)
Urobilinogen, UA: 0.2 mg/dL (ref 0.0–1.0)
pH: 5.5 (ref 5.0–8.0)

## 2019-04-24 NOTE — Progress Notes (Signed)
  Subjective:    Madeline Mccormick is being seen today for her first obstetrical visit.  This is not a planned pregnancy. She is at [redacted]w[redacted]d gestation. Her obstetrical history is significant for none. Relationship with FOB: significant other, living together. Patient does intend to breast feed. Pregnancy history fully reviewed.  Patient reports no complaints.  Review of Systems:   Review of Systems  Objective:     BP 114/72   Pulse 71   Wt 127 lb (57.6 kg)   LMP 01/07/2019 (Exact Date)   Breastfeeding Unknown   BMI 21.80 kg/m  Physical Exam  Exam Well nourished, well hydrated White female, no apparent distress Breathing, conversing, and ambulating normally Heart- rrr Lungs- CTAB Abd- benign Cervix- normal   Assessment:    Pregnancy: [id number] Patient Active Problem List   Diagnosis Date Noted  . Supervision of low-risk pregnancy 04/09/2019  . Abdominal pain in pregnancy 03/06/2019  . Prediabetes 11/18/2014  . Depression, controlled 05/04/2014  . Trochanteric bursitis of right hip 04/30/2014  . Situational anxiety 10/08/2013       Plan:     Initial labs drawn. Prenatal vitamins. Problem list reviewed and updated. NIPS/Horizon ordered Role of ultrasound in pregnancy discussed; fetal survey: ordered. Amniocentesis discussed: not indicated. Already on baby scripts with cuff and scale at home Webex visit at 20 weeks.   Allie Bossier 04/24/2019

## 2019-04-24 NOTE — Telephone Encounter (Signed)
Called the patient to inform of new location and date and time of appointment. The patient verbalized understanding. °

## 2019-04-24 NOTE — Progress Notes (Signed)
Anatomy ultrasound scheduled for June 9th @ 1315

## 2019-04-26 LAB — CYTOLOGY - PAP: Diagnosis: NEGATIVE

## 2019-04-26 LAB — CULTURE, OB URINE

## 2019-04-26 LAB — URINE CULTURE, OB REFLEX: Organism ID, Bacteria: NO GROWTH

## 2019-05-06 DIAGNOSIS — Z3490 Encounter for supervision of normal pregnancy, unspecified, unspecified trimester: Secondary | ICD-10-CM | POA: Diagnosis not present

## 2019-05-06 DIAGNOSIS — F331 Major depressive disorder, recurrent, moderate: Secondary | ICD-10-CM | POA: Diagnosis not present

## 2019-05-06 DIAGNOSIS — Z6822 Body mass index (BMI) 22.0-22.9, adult: Secondary | ICD-10-CM | POA: Diagnosis not present

## 2019-05-15 ENCOUNTER — Encounter: Payer: Self-pay | Admitting: *Deleted

## 2019-05-28 ENCOUNTER — Ambulatory Visit (HOSPITAL_COMMUNITY)
Admission: RE | Admit: 2019-05-28 | Discharge: 2019-05-28 | Disposition: A | Discharge status: Home / Self Care | Payer: Medicaid Other | Source: Ambulatory Visit | Attending: Obstetrics and Gynecology | Admitting: Obstetrics and Gynecology

## 2019-05-28 ENCOUNTER — Encounter: Payer: Self-pay | Admitting: *Deleted

## 2019-05-28 ENCOUNTER — Other Ambulatory Visit: Payer: Self-pay

## 2019-05-28 DIAGNOSIS — Z3492 Encounter for supervision of normal pregnancy, unspecified, second trimester: Secondary | ICD-10-CM | POA: Diagnosis not present

## 2019-05-28 DIAGNOSIS — Z3A19 19 weeks gestation of pregnancy: Secondary | ICD-10-CM

## 2019-05-28 DIAGNOSIS — Z363 Encounter for antenatal screening for malformations: Secondary | ICD-10-CM | POA: Diagnosis not present

## 2019-06-05 ENCOUNTER — Telehealth (INDEPENDENT_AMBULATORY_CARE_PROVIDER_SITE_OTHER): Payer: Medicaid Other | Admitting: Medical

## 2019-06-05 ENCOUNTER — Other Ambulatory Visit: Payer: Self-pay

## 2019-06-05 ENCOUNTER — Encounter: Payer: Self-pay | Admitting: Medical

## 2019-06-05 DIAGNOSIS — Z3492 Encounter for supervision of normal pregnancy, unspecified, second trimester: Secondary | ICD-10-CM

## 2019-06-05 DIAGNOSIS — Z3A20 20 weeks gestation of pregnancy: Secondary | ICD-10-CM

## 2019-06-05 NOTE — Progress Notes (Signed)
I connected with Madeline Mccormick  on 06/05/19 at  4:15 PM EDT by: MyChart and verified that I am speaking with the correct person using two identifiers.  Patient is located at home and provider is located at [facility].     The purpose of this virtual visit is to provide medical care while limiting exposure to the novel coronavirus. I discussed the limitations, risks, security and privacy concerns of performing an evaluation and management service by MyCHart and the availability of in person appointments. I also discussed with the patient that there may be a patient responsible charge related to this service. By engaging in this virtual visit, you consent to the provision of healthcare.  Additionally, you authorize for your insurance to be billed for the services provided during this visit.  The patient expressed understanding and agreed to proceed.  The following staff members participated in the virtual visit:  Carver Fila, [place] VISIT NOTE  Subjective:  Madeline Mccormick is a 23 y.o. G4P0020 at [redacted]w[redacted]d  for phone visit for ongoing prenatal care.  She is currently monitored for the following issues for this low-risk pregnancy and has Situational anxiety; Trochanteric bursitis of right hip; Depression, controlled; Prediabetes; Abdominal pain in pregnancy; and Supervision of low-risk pregnancy on their problem list.  Patient reports frequent loose stools.  Contractions: Not present. Vag. Bleeding: None.  Movement: Present. Denies leaking of fluid.   The following portions of the patient's history were reviewed and updated as appropriate: allergies, current medications, past family history, past medical history, past social history, past surgical history and problem list.   Objective:  There were no vitals filed for this visit. Self-Obtained  Fetal Status:     Movement: Present     Assessment and Plan:  Pregnancy: G4P0020 at [redacted]w[redacted]d 1. Encounter for supervision of low-risk pregnancy in second trimester -  Doing well - Complaint of loose stools frequently for the past few weeks - No food intolerances known and no link of symptoms to specific foods - Recommended Imodium to start and call the office if symptoms persist for GI referral    Preterm labor/ second trimester warning symptoms and general obstetric precautions including but not limited to vaginal bleeding, contractions, leaking of fluid and fetal movement were reviewed in detail with the patient.  Return in about 8 weeks (around 07/31/2019) for LOB, In-Person, 28 week labs (fasting).  No future appointments.   Time spent on virtual visit: 10 minutes  Kerry Hough, PA-C

## 2019-06-05 NOTE — Patient Instructions (Addendum)
AREA PEDIATRIC/FAMILY PRACTICE PHYSICIANS  Central/Southeast [place] [phone]) . [facility] Davy Pique, MD; [name], MD; Walker Kehr, MD; Andria Frames, MD; McDiarmid, MD; Dutch Quint, MD; Nori Riis, MD; Mingo Amber, [place]., [place], [place] [address] o [phone] o Mon-Fri 8:30-12:30, 1:30-5:00 o Providers come to see babies at [facility] o Accepting Medicaid . [place] [facility] [place] providers who accept newborns: Dorthy Cooler, MD; Orland Mustard, MD; Stephanie Acre, MD o [place], Rena Lara, [place] [address] o [phone] o Mon-Fri 8:00-5:30 o Babies seen by providers at [facility] o Does NOT accept Medicaid o Please call early in hospitalization for appointment (limited availability)  . Mustard [place], MD o [address], [place], [place] [address] o [phone] o Mon, Tue, Thur, Fri 8:30-5:00, Wed 10:00-7:00 (closed 1-2pm) o Babies seen by [facility] providers o Accepting Medicaid . [place], MD o [place], [place], [place] [address] o [phone] o Mon-Fri 8:30-5:00, Sat 8:30-12:00 o Provider comes to see babies at [place] Medicaid o Must have been referred from current patients or contacted office prior to delivery . [place] [facility] ([place] for [facility]) Franne Forts, MD; Tamera Punt, MD; Doneen Poisson, MD; Fatima Sanger, MD; Wynetta Emery, MD; Jess Barters, MD; Tami Ribas, MD; Herbert Moors, MD; Derrell Lolling, MD; Dorothyann Peng, MD; Lucious Groves, NP; Baldo Ash, NP o [address], [place], [place] [address] o [phone] o Mon, Tue, Thur, Fri 8:30-5:30, Wed 9:30-5:30, Sat 8:30-12:30 o Babies seen by [facility] providers o Accepting Medicaid o Only accepting infants of first-time parents or siblings of current patients [facility] discharge coordinator will make follow-up appointment . Baltazar Najjar o [place] [address],  [place], [place]  [address] o [phone]   Fax - [phone] . [facility] o [address], [place], [place]  [address] o Phone - [phone]   Fax [phone] . [place], [place], [place], [place]  [address] o [phone]  East/Northeast [place] [phone]) . [facility] Reginal Lutes, MD; Jacklynn Ganong, MD; Torrie Mayers, MD; MD; Rosana Hoes, MD; Servando Salina, MD; Rose Fillers, MD; Rex Kras, MD; Corinna Capra, MD; Volney American, MD; Trilby Drummer, MD; Janann Colonel, MD; Jimmye [place], [place], [place], [place] [address] o [phone] o Mon-Fri 8:30-5:00 (extended evenings Mon-Thur as needed), Sat-Sun 10:00-1:00 o Providers come to see babies at [place] Medicaid for families of first-time babies and families with all children in the household age 72 and under. Must register with office prior to making appointment (M-F only). . [place], NP; Tomi Bamberger, MD; Redmond School, MD; [place], [place] [address]., [place], [place] [address] o [phone] o Mon-Fri 8:00-5:00 o Babies seen by providers at [facility] o Does NOT accept Medicaid/Commercial Insurance Only . [facility])  Marnee Guarneri, MD; Drema Dallas, MD; Montine Circle, MD; Vilma Prader, MD; Vanita Panda, MD; Alfonso Ramus, MD; Ruthann Cancer, MD; Roxanne Mins, MD; Rosalva Ferron, MD; Polly Cobia, MD o [address] [place], [place] [address] o [phone] o Mon-Fri 8:30-5:30, Sat (Oct.-Mar.) 9:00-1:00 o Babies seen by providers at [place] [phone]) . [facility] Peers, MD; Suzan Slick, MD o [address], [place], [place] [address] o [phone] o Mon-Fri 8:30-5:00, Sat 8:30-12:00 o Providers come to see babies at [facility] o Does NOT accept Medicaid . [facility] Ricci Barker, PA; Mannie Stabile, MD; [place], [place]; Nancy Fetter, MD; Moreen Fowler, [place],  [place], [place] [address] o [phone] o Mon-Fri 8:00-5:00 o Babies seen by providers at [facility] o Does NOT accept Medicaid o Only accepting babies of parents who are patients o Please call early in hospitalization for appointment (limited availability) . [facility] Pediatricians Blanca Friend, MD; Sharlene Motts, MD; Rod Can, MD; Warner Mccreedy, NP; Sabra Heck, MD; Ermalinda Memos, MD; Sharlett Iles, NP; Aurther Loft, MD; Jerrye Beavers, MD; Marcello Moores, MD; Berline Lopes, MD; Charolette Forward, MD o [address] [place], [place], [place] [address] o [phone] o Mon-Fri 8:00-5:00, Sat 9:00-12:00 o Providers come to see babies at [facility] o Does NOT accept [facility] [phone]) . [place] [facility] [place] providers accepting new patients: Dayna Ramus, NP; [place], [place], [place], [place] [address] o [phone] o Mon-Fri 8:00-5:00 o Babies seen by providers at [facility] o Does NOT accept Medicaid o Only accepting babies of parents who are patients o Please call early in hospitalization for appointment (limited availability) . [facility] Oswaldo Conroy, MD; Sheran Lawless, MD o [address] [place], [place] [address] o [phone] (press 1 to schedule appointment) o Mon-Fri 8:00-5:00 o Providers come to see babies at [facility] o Does NOT accept Medicaid . [facility] Jodi Mourning, MD o [address], [place], [place] [address] o [phone] o Mon-Fri 8:30-5:00 (lunch 12:30-1:00), extended hours by appointment only Wed 5:00-6:30 o Babies seen by [facility] providers o Accepting Medicaid . [place] at Evalyn Casco, MD; [place], MD; Ethlyn Gallery, MD o [place], [place], [place] [address] o [phone] o Mon-Fri 8:00-5:00 o Babies seen by [facility] providers o Does NOT accept Medicaid . Therapist, music at [place], MD; Yong Channel, MD; [place], [place] [address] [address] [place], [place]  [address] o [phone] o Mon-Fri 8:00-5:00 o Babies seen by [facility] providers o Does NOT accept Medicaid . [place], [place]; [place], [place]; [place], NP; Albertina Parr, MD; Frederic Jericho, MD; Ronney Lion, MD; Carlos Levering, NP; Jerelene Redden, NP; Tomasita Crumble, NP; Ronelle Nigh, NP; Corinna Lines, MD; [place], MD o [address]., [place], [place] [address] o [phone] o Mon-Fri 8:30-5:00, Sat 10:00-1:00 o Providers come to see babies at [facility] o Does NOT accept Medicaid o Free prenatal information session Tuesdays at 4:45pm . [facility] Porfirio Oar, MD; [place], [place]; [place], [place]; [place], [place]., [place] [address] o [phone] o Mon-Fri 7:30-5:30 o Babies seen by [facility] providers . [facility] o [address] [place], [place], [place]  [address] o [phone]   Fax - [phone]  [place] [phone] & [phone]) . [place], MD o [address] [place], [place] [address] o [phone] o Mon-Thur 8:00-6:00 o Providers come to see babies at [place] Medicaid . [place], NP; Melford Aase, MD; [place], [place]; [place], [place]., [place], [place] [address] o [phone] o Mon-Thur 7:30-7:30, Fri 7:30-4:30 o Babies seen by [facility] providers o Accepting Medicaid . [facility] Nyra Jabs, MD; Cristino Martes, NP; Gertie Baron, MD o [address], [place], [place] [address] o [phone] o Mon-Fri 8:30-5:00, Sat 8:30-12:00 o Providers come to see babies at [place] Medicaid o Must have "Meet & Greet" appointment at office prior to delivery . [place] ([place]) o [place],  MD; Juleen China, MD; Clydene Laming, [place] [address], [place], [place] [address] o [phone] o Mon-Wed 8:00-6:00, Thur-Fri 8:00-5:00, Sat 9:00-12:00 o Providers come to  see babies at [facility] o Does NOT accept Medicaid o Only accepting siblings of current patients . [facility] [place], [place], [place], [place]  [address] o [phone]   Fax [phone] . [place] at [place] [address], [place], [place]  [address] o [phone]   Fax - Morton Gorman [phone] & [phone]) . Therapist, music at [place], DO; [place], [place]., [place], [place] [address] o [phone] o Mon-Fri 7:00-5:00 o Babies seen by [facility] providers o Does NOT accept Medicaid . [place], MD; [place], [place]; [place], [place], [place], [place] [address] o [phone] o Mon-Fri 8:00-5:00 o Babies seen by [facility] providers o Accepting Medicaid . [place], MD; [place], [place]; [place], NP; [place], [place], [place], [place] [address] o [phone] o Mon-Fri 8:00-5:00 o Babies seen by providers at [place] [phone]) . [facility] [place], [place] o [address] [place], [place] [address] o [phone] o Mon-Fri 8:00-5:00 o Babies seen by [facility] providers o Does NOT accept Medicaid o Limited availability, please call early in hospitalization to schedule follow-up . [facility] Leilani Merl, PA; Maisie Fus, MD; [place], MD; [place], [place]; Jeannine Kitten, MD; [place], [place] [facility] [address], [place], [place] [address] o [phone] o Mon-Fri 8:30-5:00, Sat 9:00-12:00 o Babies seen by providers at [facility] o Accepting Medicaid o Please register online then schedule online or call office o [email or web address] . [place] ([place] at  [place]) Kristian Covey, NP; Dwyane Dee, MD; Leonidas Romberg, PA o [address] [place], [place], [place] [address] o [phone] o Mon-Fri 8:00-5:00 o Babies seen by providers at [place] o Accepting Medicaid . [place] ([facility] [employer]) Dairl Ponder, MD; Rayvon Char, NP; Melina Modena, MD o [address] [place], [place], [place] [address] o [phone] o Mon-Fri 8:00-5:30, Sat&Sun by appointment (phones open at 8:30) o Babies seen by [facility] providers o Accepting Medicaid o Must be a first-time baby or sibling of current patient . [place], [address], [place], [place]  [address] o [phone]   Fax - [phone]  [place] [phone] & [phone]) . [place], [place]; [place], [place]; Benjamine Mola, MD; [place], [place]; Harrell Lark, MD o [address] [place], [place] [address] o [phone] o Mon-Thur 8:00-7:00, Fri 8:00-5:00, Sat 8:00-12:00, Sun 9:00-12:00 o Babies seen by [facility] providers o Accepting Medicaid . [facility] at [facility], MD; Ruthann Cancer, MD; [facility], MD o [address], [place], [place] [address] o [phone] o Mon-Thur 8:00-5:00 o Babies seen by providers at [facility] o Accepting Medicaid . [facility] at [place], MD; Coe[place]Go[place], MD; Amedeo Plenty, MD; Bobby Rumpf, MD; List, MD; Lavonia Drafts, MD; Ruthann Cancer, MD; Selinda Eon, MD; Audie Box, MD; Jim Like, MD; Christie Nottingham, MD; Hubbard Hartshorn, MD; Modena Nunnery, MD o [place]., [place], [place]  [address] o [phone] o Mon-Fri 8:00-5:30, Sat (Oct.-Mar.) 9:00-1:00 o Babies seen by providers at [facility] o Accepting Medicaid o Must fill out new patient packet, available online at [email or web address] . [place] ([facility]) Barnabas Lister, NP; Kenton Kingfisher, NP; Claiborne Billings, NP; Rolla Plate, MD;  [place], [place]; Carola Rhine, MD; Tyron Russell, MD; Delia Chimes, NP o [address], [place], [place] [address] o [phone] o Mon-Thur 8:00-5:30, Fri 8:00-5:00 o Babies seen by providers at [place] [phone]) . [place], [place]; [place], MD; Dennard Schaumann, MD; [place], [place] o [address] [place], [place] [address] o [phone] o Mon-Fri 8:00-5:00 o Babies seen by providers at [place] [phone]) . [place] at [place], [place]; Olen Pel, MD; [place], [place], [place], [place] [address] o [phone] o Mon-Fri 8:00-5:00 o Babies seen by providers at [facility] o Does NOT accept Medicaid o Limited appointment availability, please call early in hospitalization  . Therapist, music at [place], [place]; [place], [address] [place], [place] [address] o [phone] o Mon-Fri 8:00-5:00 o Babies seen by [facility] providers o Does NOT accept Medicaid . [facility] Su Grand, MD; Guy Sandifer, MD; [place], [place]; [place], [place] [address], [place], [place] [address] o [phone] o Mon-Fri 8:00-5:00 o After hours clinic [facility] [place], [place] [address]) [phone] Mon-Fri 5:00-8:00, Sat 12:00-6:00, Sun 10:00-4:00 o Babies seen by [facility] providers o Accepting Medicaid . [place] at [facility] o [address], [place], [place]  [address] o [phone]   Fax - [phone]  [place] [phone]) . Therapist, music at [facility], MD o [address] [place], [place],  [address] o [phone] o Mon-Fri 8:00-5:00 o Babies seen by [facility] providers o Does NOT accept Medicaid . [employer] ([place] [facility] [place]) Bing Neighbors, MD o [address], [place],   [address] o [phone] o Mon-Thur 8:00-7:00, Fri 8:00-5:00, Sat 8:00-12:00 o Babies seen by providers at [facility] o Accepting Medicaid - but does not have vaccinations in office (must be received elsewhere) o Limited availability, please call early in hospitalization  [place] ([address]) . [place], MD o [address], [place] [address] o [phone]  Fax [phone]  Childbirth Education Options: [facility] Department Classes:  Childbirth education classes can help you get ready for a positive parenting experience. You can also meet other expectant parents and get free stuff for your baby. Each class runs for five weeks on the same night and costs $45 for the mother-to-be and her support person. Medicaid covers the cost if you are eligible. Call [phone] to register. [facility] Childbirth Education:  [phone] or [phone] or [email or web address]  Baby & Me Class: Discuss newborn & infant parenting and family adjustment issues with other new mothers in a relaxed environment. Each week brings a new speaker or baby-centered activity. We encourage new mothers to join [place] every Thursday at 11:00am. Babies birth until crawling. No registration or fee. Daddy [employer]: This course offers Dads-to-be the tools and knowledge needed to feel confident on their journey to becoming new fathers. Experienced dads, who have been trained as coaches, teach dads-to-be how to  hold, comfort, diaper, swaddle and play with their infant while being able to support the new mom as well. A class for men taught by men. $25/dad Big Brother/Big Sister: Let your children share in the joy of a new brother or sister in this special class designed just for them. Class includes discussion about how families care for babies: swaddling, holding, diapering, safety as well as how they can be helpful in their new role. This class is designed for  children ages 20 to 95, but any age is welcome. Please register each child individually. $5/child  Mom Talk: This mom-led group offers support and connection to mothers as they journey through the adjustments and struggles of that sometimes overwhelming first year after the birth of a child. Tuesdays at 10:00am and Thursdays at 6:00pm. Babies welcome. No registration or fee. Breastfeeding Support Group: This group is a mother-to-mother support circle where moms have the opportunity to share their breastfeeding experiences. A Lactation Consultant is present for questions and concerns. Meets each Tuesday at 11:00am. No fee or registration. Breastfeeding Your Baby: Learn what to expect in the first days of breastfeeding your newborn.  This class will help you feel more confident with the skills needed to begin your breastfeeding experience. Many new mothers are concerned about breastfeeding after leaving the hospital. This class will also address the most common fears and challenges about breastfeeding during the first few weeks, months and beyond. (call for fee) Comfort Techniques and Tour: This 2 hour interactive class will provide you the opportunity to learn & practice hands-on techniques that can help relieve some of the discomfort of labor and encourage your baby to rotate toward the best position for birth. You and your partner will be able to try a variety of labor positions with birth balls and rebozos as well as practice breathing, relaxation, and visualization techniques. A tour of the [facility] is included with this class. $20 per registrant and support person Childbirth Class- Weekend Option: This class is a Weekend version of our Birth & Baby series. It is designed for parents who have a difficult time fitting several weeks of classes into their schedule. It covers the care of your newborn and the basics of labor and childbirth. It also includes a [place]  [facility] and lunch. The class is held two consecutive days: beginning on Friday evening from 6:30 - 8:30 p.m. and the next day, Saturday from 9 a.m. - 4 p.m. (call for fee) Doren Custard Class: Interested in a waterbirth?  This informational class will help you discover whether waterbirth is the right fit for you. Education about waterbirth itself, supplies you would need and how to assemble your support team is what you can expect from this class. Some obstetrical practices require this class in order to pursue a waterbirth. (Not all obstetrical practices offer waterbirth-check with your healthcare provider.) Register only the expectant mom, but you are encouraged to bring your partner to class! Required if planning waterbirth, no fee. Infant/Child CPR: Parents, grandparents, babysitters, and friends learn Cardio-Pulmonary Resuscitation skills for infants and children. You will also learn how to treat both conscious and unconscious choking in infants and children. This Family & Friends program does not offer certification. Register each participant individually to ensure that enough mannequins are available. (Call for fee) Grandparent Love: Expecting a grandbaby? This class is for you! Learn about the latest infant care and safety recommendations and ways to support your own child as  he or she transitions into the parenting role. Taught by Registered Nurses who are childbirth instructors, but most importantly...they are grandmothers too! $10/person. Childbirth Class- Natural Childbirth: This series of 5 weekly classes is for expectant parents who want to learn and practice natural methods of coping with the process of labor and childbirth. Relaxation, breathing, massage, visualization, role of the partner, and helpful positioning are highlighted. Participants learn how to be confident in their body's ability to give birth. This class will empower and help parents make informed decisions about their own  care. Includes discussion that will help new parents transition into the immediate postpartum period. [facility] is included. We suggest taking this class between 25-32 weeks, but it's only a recommendation. $75 per registrant and one support person or $30 Medicaid. Childbirth Class- 3 week Series: This option of 3 weekly classes helps you and your labor partner prepare for childbirth. Newborn care, labor & birth, cesarean birth, pain management, and comfort techniques are discussed and a [place] [facility] is included. The class meets at the same time, on the same day of the week for 3 consecutive weeks beginning with the starting date you choose. $60 for registrant and one support person.  Marvelous Multiples: Expecting twins, triplets, or more? This class covers the differences in labor, birth, parenting, and breastfeeding issues that face multiples' parents. NICU tour is included. Led by a Certified Childbirth Educator who is the mother of twins. No fee. Caring for Baby: This class is for expectant and adoptive parents who want to learn and practice the most up-to-date newborn care for their babies. Focus is on birth through the first six weeks of life. Topics include feeding, bathing, diapering, crying, umbilical cord care, circumcision care and safe sleep. Parents learn to recognize symptoms of illness and when to call the pediatrician. Register only the mom-to-be and your partner or support person can plan to come with you! $10 per registrant and support person Childbirth Class- online option: This online class offers you the freedom to complete a Birth and Baby series in the comfort of your own home. The flexibility of this option allows you to review sections at your own pace, at times convenient to you and your support people. It includes additional video information, animations, quizzes, and extended activities. Get organized with helpful  eClass tools, checklists, and trackers. Once you register online for the class, you will receive an email within a few days to accept the invitation and begin the class when the time is right for you. The content will be available to you for 60 days. $60 for 60 days of online access for you and your support people.  Local Doulas: Natural Baby Doulas [email or web address] Tel: [phone] [email or web address] [employer] [phone] [email or web address] The Labor Hassell Halim  (also do waterbirth tub rental) [phone] [email or web address] Tri[facility] Birth Doula [phone] [email or web address] [email or web address].fi Sacred Rhythms  [phone] https://[email or web address]-[email or web address]/ [employer] (PADA) [email or web address].[email or web address]/ [place] Birth and Baby  [email or web address] Considering Waterbirth? Guide for patients at Center for [employer]  Why consider waterbirth?  . Gentle birth for babies . Less pain medicine used in labor . May allow for passive descent/less pushing . May reduce perineal tears  . More mobility and instinctive maternal position changes . Increased maternal relaxation . Reduced blood pressure in labor  Is waterbirth safe? What are the risks of infection, drowning or other complications?  . Infection: o Very low risk (3.7 % for  tub vs 4.8% for bed) o 7 in 8000 waterbirths with documented infection o Poorly cleaned equipment most common cause o Slightly lower group B strep transmission rate  . Drowning o Maternal:  - Very low risk   - Related to seizures or fainting o Newborn:  - Very low risk. No evidence of increased risk of respiratory problems in multiple large studies - Physiological protection from breathing under water - Avoid underwater birth if there are any fetal  complications - Once baby's head is out of the water, keep it out.  . Birth complication o Some reports of cord trauma, but risk decreased by bringing baby to surface gradually o No evidence of increased risk of shoulder dystocia. Mothers can usually change positions faster in water than in a bed, possibly aiding the maneuvers to free the shoulder.   You must attend a Doren Custard class at [facility]  3rd Wednesday of every month from 7-9pm  [employer] by calling [phone] or online at [email or web address]  Bring [place] the certificate from the class to your prenatal appointment  Meet with a midwife at 36 weeks to see if you can still plan a waterbirth and to sign the consent.   Purchase or rent the following supplies:   Water Birth Pool (Birth Pool in a Box or [employer] for instance)  (Tubs start ~$125)  Single-use disposable tub liner designed for your brand of tub  New garden hose labeled "lead-free", "suitable for drinking water",  Electric drain pump to remove water (We recommend 792 gallon per hour or greater pump.)   Separate garden hose to remove the dirty water  Fish net  Bathing suit top (optional)  Long-handled mirror (optional)  Places to purchase or rent supplies  [employer] for tub purchases and supplies  Waterbirthsolutions.com for tub purchases and supplies  [employer] (www.thelaborladies.com) $275 for tub rental/set-up & take down/kit   [employer] (http://www.fleming.com/.htm) Information regarding doulas (labor support) who provide pool rentals  Our practice has a Birth Pool in a Box tub at the hospital that you may borrow on a first-come-first-served basis. It is your responsibility to to set up, clean and break down the tub. We cannot guarantee the availability of this tub in advance. You are responsible for bringing all accessories listed above. If you do not have all necessary supplies you cannot  have a waterbirth.    Things that would prevent you from having a waterbirth:  Premature, <37wks  Previous cesarean birth  Presence of thick meconium-stained fluid  Multiple gestation (Twins, triplets, etc.)  Uncontrolled diabetes or gestational diabetes requiring medication  Hypertension requiring medication or diagnosis of pre-eclampsia  Heavy vaginal bleeding  Non-reassuring fetal heart rate  Active infection (MRSA, etc.). Group B Strep is NOT a contraindication for  waterbirth.  If your labor has to be induced and induction method requires continuous  monitoring of the baby's heart rate  Other risks/issues identified by your obstetrical provider  Please remember that birth is unpredictable. Under certain unforeseeable circumstances your provider may advise against giving birth in the tub. These decisions will be made on a case-by-case basis and with the safety of you and your baby as our highest priority.  Places to have your son circumcised:  [facility]     [phone]   $480 while you are in hospital         [facility]              [phone]   $269 by 4 wks                      [facility]                     [phone]   $269 by 7 days [facility]                    [phone]   $269 by 4 wks [facility]             [phone]   $175 by 2 wks    These prices sometimes change but are roughly what you can expect to pay. Please call and confirm pricing.   Circumcision is considered an elective/non-medically necessary procedure. There are many reasons parents decide to have their sons circumsized. During the first year of life circumcised males have a reduced risk of urinary tract infections but after this year the rates between circumcised males and uncircumcised males are the same.  It is safe to have your son circumcised outside of the hospital and the places above perform them regularly.   Deciding about Circumcision in  Baby Boys  (Up-to-date The Basics)  What is circumcision?   Circumcision is a surgery that removes the skin that covers the tip of the penis, called the "foreskin" Circumcision is usually done when a boy is between 71 and 47 days old. In the [place], circumcision is common. In some other countries, fewer boys are circumcised. Circumcision is a common tradition in some religions.  Should I have my baby boy circumcised?   There is no easy answer. Circumcision has some benefits. But it also has risks. After talking with your doctor, you will have to decide for yourself what is right for your family.  What are the benefits of circumcision?   Circumcised boys seem to have slightly lower rates of: ?Urinary tract infections ?Swelling of the opening at the tip of the penis Circumcised men seem to have slightly lower rates of: ?Urinary tract infections ?Swelling of the opening at the tip of the penis ?Penis cancer ?HIV and other infections that you catch during sex ?Cervical cancer in the women they have sex with Even so, in the [place], the risks of these problems are small - even in boys and men who have not been circumcised. Plus, boys and men who are not circumcised can reduce these extra risks by: ?Cleaning their penis well ?Using condoms during sex  What are the risks of circumcision?  Risks include: ?Bleeding or infection from the surgery ?Damage to or amputation of the penis ?A chance that the doctor will cut off too much or not enough of the foreskin ?A chance that sex won't feel as good later in life Only about 1 out of every 200 circumcisions leads to problems. There is also a chance that your health insurance won't pay for circumcision.  How is circumcision done in baby boys?  First, the baby gets medicine for pain relief. This might be a cream on the skin or a shot into the base of the penis. Next, the doctor cleans the baby's penis well. Then he or she uses  special tools to cut off the foreskin. Finally, the doctor  wraps a bandage (called gauze) around the baby's penis. If you have your baby circumcised, his doctor or nurse will give you instructions on how to care for him after the surgery. It is important that you follow those instructions carefully.

## 2019-07-19 ENCOUNTER — Telehealth: Payer: Self-pay | Admitting: Family Medicine

## 2019-07-19 NOTE — Telephone Encounter (Signed)
Attempted to call patient to get her OB appointment rescheduled to a later time due to the provider she was originally scheduled with is now only see virtual patients. No answer, left voicemail with new appointment information. Patient instructed to give the office a call if she is needing to reschedule. Patient instructed to wear a face mask and no visitors are allowed

## 2019-07-25 ENCOUNTER — Other Ambulatory Visit: Payer: Self-pay | Admitting: Emergency Medicine

## 2019-07-25 DIAGNOSIS — Z3492 Encounter for supervision of normal pregnancy, unspecified, second trimester: Secondary | ICD-10-CM

## 2019-07-30 ENCOUNTER — Telehealth: Payer: Self-pay | Admitting: Advanced Practice Midwife

## 2019-07-30 NOTE — Telephone Encounter (Signed)
Called the patient to confirm the upcoming scheduled appointment. Received message the person you are trying to reach has a voicemail box that is not setup yet, Good-bye.

## 2019-07-31 ENCOUNTER — Ambulatory Visit (INDEPENDENT_AMBULATORY_CARE_PROVIDER_SITE_OTHER): Payer: BC Managed Care – PPO | Admitting: Medical

## 2019-07-31 ENCOUNTER — Encounter: Payer: Medicaid Other | Admitting: Obstetrics and Gynecology

## 2019-07-31 ENCOUNTER — Other Ambulatory Visit: Payer: Medicaid Other

## 2019-07-31 ENCOUNTER — Other Ambulatory Visit: Payer: Self-pay

## 2019-07-31 VITALS — BP 103/68 | HR 74 | Temp 97.8°F | Wt 146.2 lb

## 2019-07-31 DIAGNOSIS — Z3492 Encounter for supervision of normal pregnancy, unspecified, second trimester: Secondary | ICD-10-CM | POA: Diagnosis not present

## 2019-07-31 DIAGNOSIS — Z23 Encounter for immunization: Secondary | ICD-10-CM | POA: Diagnosis not present

## 2019-07-31 DIAGNOSIS — Z3A28 28 weeks gestation of pregnancy: Secondary | ICD-10-CM

## 2019-07-31 NOTE — Progress Notes (Signed)
   PRENATAL VISIT NOTE  Subjective:  Madeline Mccormick is a 23 y.o. G4P0020 at [redacted]w[redacted]d being seen today for ongoing prenatal care.  She is currently monitored for the following issues for this low-risk pregnancy and has Situational anxiety; Trochanteric bursitis of right hip; Depression, controlled; Prediabetes; Abdominal pain in pregnancy; and Supervision of low-risk pregnancy on their problem list.  Patient reports no complaints. Diarrhea has resolved. Reports some acid reflux that has resolved with Tums and diet adjustments. Contractions: Irritability. Vag. Bleeding: None.  Movement: Present. Denies leaking of fluid.   The following portions of the patient's history were reviewed and updated as appropriate: allergies, current medications, past family history, past medical history, past social history, past surgical history and problem list.   Objective:   Vitals:   07/31/19 0939  BP: 103/68  Pulse: 74  Temp: 97.8 F (36.6 C)  Weight: 146 lb 3.2 oz (66.3 kg)    Fetal Status: Fetal Heart Rate (bpm): 133 Fundal Height: 26 cm Movement: Present     General:  Alert, oriented and cooperative. Patient is in no acute distress.  Skin: Skin is warm and dry. No rash noted.   Cardiovascular: Normal heart rate noted  Respiratory: Normal respiratory effort, no problems with respiration noted  Abdomen: Soft, gravid, appropriate for gestational age.  Pain/Pressure: Absent     Pelvic: Cervical exam deferred        Extremities: Normal range of motion.  Edema: Trace  Mental Status: Normal mood and affect. Normal behavior. Normal judgment and thought content.   Assessment and Plan:  Pregnancy: [id number] at [redacted]w[redacted]d 1. Encounter for supervision of low-risk pregnancy in second trimester - Low Risk. Virtual visit in 4 weeks.  - Tdap vaccine today - 2 hour GTT today - Discussed kick counts and Braxton Hicks   Preterm labor symptoms and general obstetric precautions including but not limited to vaginal  bleeding, contractions, leaking of fluid and fetal movement were reviewed in detail with the patient. Please refer to After Visit Summary for other counseling recommendations.   Return in about 4 weeks (around 08/28/2019) for LOB, virtual .  Future Appointments  Date Time Provider [place]  08/28/2019 10:15 AM Luvenia Redden, PA-C [facility]    Chauncey Mann, MD

## 2019-07-31 NOTE — Patient Instructions (Signed)

## 2019-08-01 LAB — CBC
Hematocrit: 32.8 % — ABNORMAL LOW (ref 34.0–46.6)
Hemoglobin: 10.8 g/dL — ABNORMAL LOW (ref 11.1–15.9)
MCH: 32.4 pg (ref 26.6–33.0)
MCHC: 32.9 g/dL (ref 31.5–35.7)
MCV: 99 fL — ABNORMAL HIGH (ref 79–97)
Platelets: 302 10*3/uL (ref 150–450)
RBC: 3.33 x10E6/uL — ABNORMAL LOW (ref 3.77–5.28)
RDW: 12.4 % (ref 11.7–15.4)
WBC: 8.8 10*3/uL (ref 3.4–10.8)

## 2019-08-01 LAB — HIV ANTIBODY (ROUTINE TESTING W REFLEX): HIV Screen 4th Generation wRfx: NONREACTIVE

## 2019-08-01 LAB — RPR: RPR Ser Ql: NONREACTIVE

## 2019-08-01 LAB — GLUCOSE TOLERANCE, 2 HOURS W/ 1HR
Glucose, 1 hour: 166 mg/dL (ref 65–179)
Glucose, 2 hour: 113 mg/dL (ref 65–152)
Glucose, Fasting: 75 mg/dL (ref 65–91)

## 2019-08-07 ENCOUNTER — Telehealth: Payer: Self-pay | Admitting: Medical

## 2019-08-07 DIAGNOSIS — Z3490 Encounter for supervision of normal pregnancy, unspecified, unspecified trimester: Secondary | ICD-10-CM | POA: Diagnosis not present

## 2019-08-07 DIAGNOSIS — F331 Major depressive disorder, recurrent, moderate: Secondary | ICD-10-CM | POA: Diagnosis not present

## 2019-08-07 NOTE — Telephone Encounter (Signed)
Attempted to reach Madeline Mccormick about a chang in her appointment. Was not able to leave a message due to mailbox being full.

## 2019-08-23 ENCOUNTER — Encounter: Payer: Self-pay | Admitting: Student

## 2019-08-27 ENCOUNTER — Telehealth: Payer: Self-pay | Admitting: Student

## 2019-08-27 ENCOUNTER — Telehealth (INDEPENDENT_AMBULATORY_CARE_PROVIDER_SITE_OTHER): Payer: BC Managed Care – PPO | Admitting: Student

## 2019-08-27 ENCOUNTER — Other Ambulatory Visit: Payer: Self-pay

## 2019-08-27 ENCOUNTER — Encounter: Payer: Self-pay | Admitting: Student

## 2019-08-27 ENCOUNTER — Telehealth: Payer: Self-pay | Admitting: Obstetrics and Gynecology

## 2019-08-27 VITALS — Wt 149.0 lb

## 2019-08-27 DIAGNOSIS — Z3493 Encounter for supervision of normal pregnancy, unspecified, third trimester: Secondary | ICD-10-CM

## 2019-08-27 DIAGNOSIS — Z3A32 32 weeks gestation of pregnancy: Secondary | ICD-10-CM

## 2019-08-27 NOTE — Telephone Encounter (Signed)
Spoke with patient about her appointment on 9/9 @ 1:15. Patient instructed that the visit is a virtual appointment. 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

## 2019-08-27 NOTE — Progress Notes (Signed)
I connected with@ on 08/27/19 at  1:55 PM EDT by: mycharge video and verified that I am speaking with the correct person using two identifiers.  Patient is located at home and provider is located at [facility].     The purpose of this virtual visit is to provide medical care while limiting exposure to the novel coronavirus. I discussed the limitations, risks, security and privacy concerns of performing an evaluation and management service by mychart and the availability of in person appointments. I also discussed with the patient that there may be a patient responsible charge related to this service. By engaging in this virtual visit, you consent to the provision of healthcare.  Additionally, you authorize for your insurance to be billed for the services provided during this visit.  The patient expressed understanding and agreed to proceed.  The following staff members participated in the virtual visit:  Jorje Guild    PRENATAL VISIT NOTE  Subjective:  Madeline Mccormick is a 23 y.o. G3P0020 at [redacted]w[redacted]d  for phone visit for ongoing prenatal care.  She is currently monitored for the following issues for this low-risk pregnancy and has Situational anxiety; Trochanteric bursitis of right hip; Depression, controlled; Prediabetes; Abdominal pain in pregnancy; and Supervision of low-risk pregnancy on their problem list.  Patient reports no complaints.  Contractions: Not present. Vag. Bleeding: None.  Movement: Present. Denies leaking of fluid.   The following portions of the patient's history were reviewed and updated as appropriate: allergies, current medications, past family history, past medical history, past social history, past surgical history and problem list.   Objective:   Vitals: Did not have BP cuff with her, will send through [place] later today   08/27/19 1346  Weight: 149 lb (67.6 kg)   Self-Obtained  Fetal Status:     Movement: Present     Assessment and Plan:  Pregnancy: G3P0020 at [redacted]w[redacted]d  There are no diagnoses linked to this encounter. Preterm labor symptoms and general obstetric precautions including but not limited to vaginal bleeding, contractions, leaking of fluid and fetal movement were reviewed in detail with the patient.  Return for her visit in 4 weeks needs to be changed to in person please. .  Future Appointments  Date Time Provider [place]  08/27/2019  1:55 PM Jorje Guild, NP [facility] [place]  09/10/2019  3:15 PM Starr Lake, CNM [facility] [place]  10/01/2019  1:55 PM Ardean Larsen Mervyn Skeeters, CNM [facility] [place]  10/08/2019  3:15 PM Gavin Pound, CNM [facility] [place]  10/15/2019  1:15 PM Luvenia Redden, PA-C [facility] [place]  10/21/2019  8:15 AM [facility]     Time spent on virtual visit: 5 minutes  Jorje Guild, NP

## 2019-08-27 NOTE — Telephone Encounter (Signed)
Spoke to patient about her appointment changes. Patient verbalized understanding of the changes.

## 2019-08-27 NOTE — Patient Instructions (Signed)
Third Trimester of Pregnancy The third trimester is from week 28 through week 40 (months 7 through 9). The third trimester is a time when the unborn baby (fetus) is growing rapidly. At the end of the ninth month, the fetus is about 20 inches in length and weighs 6-10 pounds. Body changes during your third trimester Your body will continue to go through many changes during pregnancy. The changes vary from woman to woman. During the third trimester:  Your weight will continue to increase. You can expect to gain 25-35 pounds (11-16 kg) by the end of the pregnancy.  You may begin to get stretch marks on your hips, abdomen, and breasts.  You may urinate more often because the fetus is moving lower into your pelvis and pressing on your bladder.  You may develop or continue to have heartburn. This is caused by increased hormones that slow down muscles in the digestive tract.  You may develop or continue to have constipation because increased hormones slow digestion and cause the muscles that push waste through your intestines to relax.  You may develop hemorrhoids. These are swollen veins (varicose veins) in the rectum that can itch or be painful.  You may develop swollen, bulging veins (varicose veins) in your legs.  You may have increased body aches in the pelvis, back, or thighs. This is due to weight gain and increased hormones that are relaxing your joints.  You may have changes in your hair. These can include thickening of your hair, rapid growth, and changes in texture. Some women also have hair loss during or after pregnancy, or hair that feels dry or thin. Your hair will most likely return to normal after your baby is born.  Your breasts will continue to grow and they will continue to become tender. A yellow fluid (colostrum) may leak from your breasts. This is the first milk you are producing for your baby.  Your belly button may stick out.  You may notice more swelling in your hands,  face, or ankles.  You may have increased tingling or numbness in your hands, arms, and legs. The skin on your belly may also feel numb.  You may feel short of breath because of your expanding uterus.  You may have more problems sleeping. This can be caused by the size of your belly, increased need to urinate, and an increase in your body's metabolism.  You may notice the fetus "dropping," or moving lower in your abdomen (lightening).  You may have increased vaginal discharge.  You may notice your joints feel loose and you may have pain around your pelvic bone. What to expect at prenatal visits You will have prenatal exams every 2 weeks until week 36. Then you will have weekly prenatal exams. During a routine prenatal visit:  You will be weighed to make sure you and the baby are growing normally.  Your blood pressure will be taken.  Your abdomen will be measured to track your baby's growth.  The fetal heartbeat will be listened to.  Any test results from the previous visit will be discussed.  You may have a cervical check near your due date to see if your cervix has softened or thinned (effaced).  You will be tested for Group B streptococcus. This happens between 35 and 37 weeks. Your health care provider may ask you:  What your birth plan is.  How you are feeling.  If you are feeling the baby move.  If you have had any abnormal   symptoms, such as leaking fluid, bleeding, severe headaches, or abdominal cramping.  If you are using any tobacco products, including cigarettes, chewing tobacco, and electronic cigarettes.  If you have any questions. Other tests or screenings that may be performed during your third trimester include:  Blood tests that check for low iron levels (anemia).  Fetal testing to check the health, activity level, and growth of the fetus. Testing is done if you have certain medical conditions or if there are problems during the pregnancy.  Nonstress test  (NST). This test checks the health of your baby to make sure there are no signs of problems, such as the baby not getting enough oxygen. During this test, a belt is placed around your belly. The baby is made to move, and its heart rate is monitored during movement. What is false labor? False labor is a condition in which you feel small, irregular tightenings of the muscles in the womb (contractions) that usually go away with rest, changing position, or drinking water. These are called Braxton Hicks contractions. Contractions may last for hours, days, or even weeks before true labor sets in. If contractions come at regular intervals, become more frequent, increase in intensity, or become painful, you should see your health care provider. What are the signs of labor?  Abdominal cramps.  Regular contractions that start at 10 minutes apart and become stronger and more frequent with time.  Contractions that start on the top of the uterus and spread down to the lower abdomen and back.  Increased pelvic pressure and dull back pain.  A watery or bloody mucus discharge that comes from the vagina.  Leaking of amniotic fluid. This is also known as your "water breaking." It could be a slow trickle or a gush. Let your health care provider know if it has a color or strange odor. If you have any of these signs, call your health care provider right away, even if it is before your due date. Follow these instructions at home: Medicines  Follow your health care provider's instructions regarding medicine use. Specific medicines may be either safe or unsafe to take during pregnancy.  Take a prenatal vitamin that contains at least 600 micrograms (mcg) of folic acid.  If you develop constipation, try taking a stool softener if your health care provider approves. Eating and drinking   Eat a balanced diet that includes fresh fruits and vegetables, whole grains, good sources of protein such as meat, eggs, or tofu,  and low-fat dairy. Your health care provider will help you determine the amount of weight gain that is right for you.  Avoid raw meat and uncooked cheese. These carry germs that can cause birth defects in the baby.  If you have low calcium intake from food, talk to your health care provider about whether you should take a daily calcium supplement.  Eat four or five small meals rather than three large meals a day.  Limit foods that are high in fat and processed sugars, such as fried and sweet foods.  To prevent constipation: ? Drink enough fluid to keep your urine clear or pale yellow. ? Eat foods that are high in fiber, such as fresh fruits and vegetables, whole grains, and beans. Activity  Exercise only as directed by your health care provider. Most women can continue their usual exercise routine during pregnancy. Try to exercise for 30 minutes at least 5 days a week. Stop exercising if you experience uterine contractions.  Avoid heavy lifting.  Do   not exercise in extreme heat or humidity, or at high altitudes.  Wear low-heel, comfortable shoes.  Practice good posture.  You may continue to have sex unless your health care provider tells you otherwise. Relieving pain and discomfort  Take frequent breaks and rest with your legs elevated if you have leg cramps or low back pain.  Take warm sitz baths to soothe any pain or discomfort caused by hemorrhoids. Use hemorrhoid cream if your health care provider approves.  Wear a good support bra to prevent discomfort from breast tenderness.  If you develop varicose veins: ? Wear support pantyhose or compression stockings as told by your healthcare provider. ? Elevate your feet for 15 minutes, 3-4 times a day. Prenatal care  Write down your questions. Take them to your prenatal visits.  Keep all your prenatal visits as told by your health care provider. This is important. Safety  Wear your seat belt at all times when driving.  Make  a list of emergency phone numbers, including numbers for family, friends, the hospital, and police and fire departments. General instructions  Avoid cat litter boxes and soil used by cats. These carry germs that can cause birth defects in the baby. If you have a cat, ask someone to clean the litter box for you.  Do not travel far distances unless it is absolutely necessary and only with the approval of your health care provider.  Do not use hot tubs, steam rooms, or saunas.  Do not drink alcohol.  Do not use any products that contain nicotine or tobacco, such as cigarettes and e-cigarettes. If you need help quitting, ask your health care provider.  Do not use any medicinal herbs or unprescribed drugs. These chemicals affect the formation and growth of the baby.  Do not douche or use tampons or scented sanitary pads.  Do not cross your legs for long periods of time.  To prepare for the arrival of your baby: ? Take prenatal classes to understand, practice, and ask questions about labor and delivery. ? Make a trial run to the hospital. ? Visit the hospital and tour the maternity area. ? Arrange for maternity or paternity leave through employers. ? Arrange for family and friends to take care of pets while you are in the hospital. ? Purchase a rear-facing car seat and make sure you know how to install it in your car. ? Pack your hospital bag. ? Prepare the baby's nursery. Make sure to remove all pillows and stuffed animals from the baby's crib to prevent suffocation.  Visit your dentist if you have not gone during your pregnancy. Use a soft toothbrush to brush your teeth and be gentle when you floss. Contact a health care provider if:  You are unsure if you are in labor or if your water has broken.  You become dizzy.  You have mild pelvic cramps, pelvic pressure, or nagging pain in your abdominal area.  You have lower back pain.  You have persistent nausea, vomiting, or diarrhea.   You have an unusual or bad smelling vaginal discharge.  You have pain when you urinate. Get help right away if:  Your water breaks before 37 weeks.  You have regular contractions less than 5 minutes apart before 37 weeks.  You have a fever.  You are leaking fluid from your vagina.  You have spotting or bleeding from your vagina.  You have severe abdominal pain or cramping.  You have rapid weight loss or weight gain.  You have   shortness of breath with chest pain.  You notice sudden or extreme swelling of your face, hands, ankles, feet, or legs.  Your baby makes fewer than 10 movements in 2 hours.  You have severe headaches that do not go away when you take medicine.  You have vision changes. Summary  The third trimester is from week 28 through week 40, months 7 through 9. The third trimester is a time when the unborn baby (fetus) is growing rapidly.  During the third trimester, your discomfort may increase as you and your baby continue to gain weight. You may have abdominal, leg, and back pain, sleeping problems, and an increased need to urinate.  During the third trimester your breasts will keep growing and they will continue to become tender. A yellow fluid (colostrum) may leak from your breasts. This is the first milk you are producing for your baby.  False labor is a condition in which you feel small, irregular tightenings of the muscles in the womb (contractions) that eventually go away. These are called Braxton Hicks contractions. Contractions may last for hours, days, or even weeks before true labor sets in.  Signs of labor can include: abdominal cramps; regular contractions that start at 10 minutes apart and become stronger and more frequent with time; watery or bloody mucus discharge that comes from the vagina; increased pelvic pressure and dull back pain; and leaking of amniotic fluid. This information is not intended to replace advice given to you by your health  care provider. Make sure you discuss any questions you have with your health care provider. Document Released: 11/29/2001 Document Revised: 03/28/2019 Document Reviewed: 01/10/2017 Elsevier Patient Education  2020 [employer]  

## 2019-08-28 ENCOUNTER — Telehealth: Payer: Medicaid Other | Admitting: Medical

## 2019-09-10 ENCOUNTER — Other Ambulatory Visit: Payer: Self-pay

## 2019-09-10 ENCOUNTER — Telehealth (INDEPENDENT_AMBULATORY_CARE_PROVIDER_SITE_OTHER): Payer: BC Managed Care – PPO | Admitting: Student

## 2019-09-10 VITALS — BP 113/75 | HR 79

## 2019-09-10 DIAGNOSIS — Z3493 Encounter for supervision of normal pregnancy, unspecified, third trimester: Secondary | ICD-10-CM

## 2019-09-10 DIAGNOSIS — Z3A34 34 weeks gestation of pregnancy: Secondary | ICD-10-CM

## 2019-09-10 NOTE — Patient Instructions (Signed)
Preeclampsia and Eclampsia °Preeclampsia is a serious condition that may develop during pregnancy. This condition causes high blood pressure and increased protein in your urine along with other symptoms, such as headaches and vision changes. These symptoms may develop as the condition gets worse. Preeclampsia may occur at 20 weeks of pregnancy or later. °Diagnosing and treating preeclampsia early is very important. If not treated early, it can cause serious problems for you and your baby. One problem it can lead to is eclampsia. Eclampsia is a condition that causes muscle jerking or shaking (convulsions or seizures) and other serious problems for the mother. During pregnancy, delivering your baby may be the best treatment for preeclampsia or eclampsia. For most women, preeclampsia and eclampsia symptoms go away after giving birth. °In rare cases, a woman may develop preeclampsia after giving birth (postpartum preeclampsia). This usually occurs within 48 hours after childbirth but may occur up to 6 weeks after giving birth. °What are the causes? °The cause of preeclampsia is not known. °What increases the risk? °The following risk factors make you more likely to develop preeclampsia: °· Being pregnant for the first time. °· Having had preeclampsia during a past pregnancy. °· Having a family history of preeclampsia. °· Having high blood pressure. °· Being pregnant with more than one baby. °· Being 35 or older. °· Being African-American. °· Having kidney disease or diabetes. °· Having medical conditions such as lupus or blood diseases. °· Being very overweight (obese). °What are the signs or symptoms? °The most common symptoms are: °· Severe headaches. °· Vision problems, such as blurred or double vision. °· Abdominal pain, especially upper abdominal pain. °Other symptoms that may develop as the condition gets worse include: °· Sudden weight gain. °· Sudden swelling of the hands, face, legs, and feet. °· Severe nausea  and vomiting. °· Numbness in the face, arms, legs, and feet. °· Dizziness. °· Urinating less than usual. °· Slurred speech. °· Convulsions or seizures. °How is this diagnosed? °There are no screening tests for preeclampsia. Your health care provider will ask you about symptoms and check for signs of preeclampsia during your prenatal visits. You may also have tests that include: °· Checking your blood pressure. °· Urine tests to check for protein. Your health care provider will check for this at every prenatal visit. °· Blood tests. °· Monitoring your baby's heart rate. °· Ultrasound. °How is this treated? °You and your health care provider will determine the treatment approach that is best for you. Treatment may include: °· Having more frequent prenatal exams to check for signs of preeclampsia, if you have an increased risk for preeclampsia. °· Medicine to lower your blood pressure. °· Staying in the hospital, if your condition is severe. There, treatment will focus on controlling your blood pressure and the amount of fluids in your body (fluid retention). °· Taking medicine (magnesium sulfate) to prevent seizures. This may be given as an injection or through an IV. °· Taking a low-dose aspirin during your pregnancy. °· Delivering your baby early. You may have your labor started with medicine (induced), or you may have a cesarean delivery. °Follow these instructions at home: °Eating and drinking ° °· Drink enough fluid to keep your urine pale yellow. °· Avoid caffeine. °Lifestyle °· Do not use any products that contain nicotine or tobacco, such as cigarettes and e-cigarettes. If you need help quitting, ask your health care provider. °· Do not use alcohol or drugs. °· Avoid stress as much as possible. Rest and get   plenty of sleep. °General instructions °· Take over-the-counter and prescription medicines only as told by your health care provider. °· When lying down, lie on your left side. This keeps pressure off your  major blood vessels. °· When sitting or lying down, raise (elevate) your feet. Try putting some pillows underneath your lower legs. °· Exercise regularly. Ask your health care provider what kinds of exercise are best for you. °· Keep all follow-up and prenatal visits as told by your health care provider. This is important. °How is this prevented? °There is no known way of preventing preeclampsia or eclampsia from developing. However, to lower your risk of complications and detect problems early: °· Get regular prenatal care. Your health care provider may be able to diagnose and treat the condition early. °· Maintain a healthy weight. Ask your health care provider for help managing weight gain during pregnancy. °· Work with your health care provider to manage any long-term (chronic) health conditions you have, such as diabetes or kidney problems. °· You may have tests of your blood pressure and kidney function after giving birth. °· Your health care provider may have you take low-dose aspirin during your next pregnancy. °Contact a health care provider if: °· You have symptoms that your health care provider told you may require more treatment or monitoring, such as: °? Headaches. °? Nausea or vomiting. °? Abdominal pain. °? Dizziness. °? Light-headedness. °Get help right away if: °· You have severe: °? Abdominal pain. °? Headaches that do not get better. °? Dizziness. °? Vision problems. °? Confusion. °? Nausea or vomiting. °· You have any of the following: °? A seizure. °? Sudden, rapid weight gain. °? Sudden swelling in your hands, ankles, or face. °? Trouble moving any part of your body. °? Numbness in any part of your body. °? Trouble speaking. °? Abnormal bleeding. °· You faint. °Summary °· Preeclampsia is a serious condition that may develop during pregnancy. °· This condition causes high blood pressure and increased protein in your urine along with other symptoms, such as headaches and vision  changes. °· Diagnosing and treating preeclampsia early is very important. If not treated early, it can cause serious problems for you and your baby. °· Get help right away if you have symptoms that your health care provider told you to watch for. °This information is not intended to replace advice given to you by your health care provider. Make sure you discuss any questions you have with your health care provider. °Document Released: 12/02/2000 Document Revised: 08/07/2018 Document Reviewed: 07/11/2016 °Elsevier Patient Education © 2020 [employer] ° °

## 2019-09-10 NOTE — Progress Notes (Signed)
I connected with@ on 09/10/19 at  3:15 PM EDT by: MyChart and verified that I am speaking with the correct person using two identifiers.  Patient is located at home and provider is located at [place].    The purpose of this virtual visit is to provide medical care while limiting exposure to the novel coronavirus. I discussed the limitations, risks, security and privacy concerns of performing an evaluation and management service by My Chart and the availability of in person appointments. I also discussed with the patient that there may be a patient responsible charge related to this service. By engaging in this virtual visit, you consent to the provision of healthcare.  Additionally, you authorize for your insurance to be billed for the services provided during this visit.  The patient expressed understanding and agreed to proceed.  The following staff members participated in the virtual visit:  Carver Fila.     PRENATAL VISIT NOTE  Subjective:  Madeline Mccormick is a 23 y.o. G3P0020 at [redacted]w[redacted]d  for phone visit for ongoing prenatal care.  She is currently monitored for the following issues for this low-risk pregnancy and has Situational anxiety; Trochanteric bursitis of right hip; Depression, controlled; Prediabetes; Abdominal pain in pregnancy; and Supervision of low-risk pregnancy on their problem list.  Patient reports no complaints.  Contractions: Irritability. Vag. Bleeding: None.  Movement: Present. Denies leaking of fluid.   The following portions of the patient's history were reviewed and updated as appropriate: allergies, current medications, past family history, past medical history, past social history, past surgical history and problem list.   Objective:   Vitals:   09/10/19 1511  BP: 113/75  Pulse: 79   Self-Obtained  Fetal Status:     Movement: Present     Assessment and Plan:  Pregnancy: G3P0020 at 25w0 1. Encounter for supervision of low-risk pregnancy in third trimester   -patient  doing well, no complaints.  -has not been checking BPs, but she knows how to do it and as the babyRX app. She will put them into the system or record and write down for next visit.  -reviewed warning signs and when to come to MAU  Preterm labor symptoms and general obstetric precautions including but not limited to vaginal bleeding, contractions, leaking of fluid and fetal movement were reviewed in detail with the patient.  No follow-ups on file.  Future Appointments  Date Time Provider [place]  10/01/2019  1:55 PM Brown Human [facility] [place]  10/08/2019  3:15 PM Gavin Pound, CNM [facility] [place]  10/15/2019  1:15 PM Luvenia Redden, PA-C [facility] [place]  10/21/2019  8:15 AM [facility] NST [facility]     Time spent on virtual visit: 30 minutes  [place], [place]

## 2019-09-10 NOTE — Progress Notes (Signed)
3:01p- Called pt for My Chart visit,no answer, left VM that I will call back in 10 minutes.   3:10p_ Pt called back

## 2019-10-01 ENCOUNTER — Other Ambulatory Visit: Payer: Self-pay

## 2019-10-01 ENCOUNTER — Other Ambulatory Visit (HOSPITAL_COMMUNITY)
Admission: RE | Admit: 2019-10-01 | Discharge: 2019-10-01 | Disposition: A | Discharge status: Home / Self Care | Payer: BC Managed Care – PPO | Source: Ambulatory Visit | Attending: Student | Admitting: Student

## 2019-10-01 ENCOUNTER — Ambulatory Visit (INDEPENDENT_AMBULATORY_CARE_PROVIDER_SITE_OTHER): Payer: Medicaid Other | Admitting: Student

## 2019-10-01 VITALS — BP 120/80 | HR 78 | Wt 162.2 lb

## 2019-10-01 DIAGNOSIS — Z3A37 37 weeks gestation of pregnancy: Secondary | ICD-10-CM

## 2019-10-01 DIAGNOSIS — Z3493 Encounter for supervision of normal pregnancy, unspecified, third trimester: Secondary | ICD-10-CM

## 2019-10-01 NOTE — Progress Notes (Signed)
   PRENATAL VISIT NOTE  Subjective:  Madeline Mccormick is a 23 y.o. G3P0020 at [redacted]w[redacted]d being seen today for ongoing prenatal care.  She is currently monitored for the following issues for this low-risk pregnancy and has Situational anxiety; Trochanteric bursitis of right hip; Depression, controlled; Prediabetes; Abdominal pain in pregnancy; and Supervision of low-risk pregnancy on their problem list.  Patient reports no complaints.  Contractions: Irritability. Vag. Bleeding: None.  Movement: Present. Denies leaking of fluid.   The following portions of the patient's history were reviewed and updated as appropriate: allergies, current medications, past family history, past medical history, past social history, past surgical history and problem list.   Objective:   Vitals:   09/26/19 1439 10/01/19 1417  BP: 106/66 120/80  Pulse:  78  Weight:  162 lb 3.2 oz (73.6 kg)    Fetal Status: Fetal Heart Rate (bpm): 134 Fundal Height: 35 cm Movement: Present  Presentation: Vertex  General:  Alert, oriented and cooperative. Patient is in no acute distress.  Skin: Skin is warm and dry. No rash noted.   Cardiovascular: Normal heart rate noted  Respiratory: Normal respiratory effort, no problems with respiration noted  Abdomen: Soft, gravid, appropriate for gestational age.  Pain/Pressure: Present     Pelvic: Cervical exam performed Dilation: Closed Effacement (%): Thick Station: -3  Extremities: Normal range of motion.  Edema: Trace  Mental Status: Normal mood and affect. Normal behavior. Normal judgment and thought content.   Assessment and Plan:  Pregnancy: G3P0020 at [redacted]w[redacted]d 1. Encounter for supervision of low-risk pregnancy in third trimester -Keep taking blood pressure at home -Reviewed signs of labor and blood pressure - GC/Chlamydia probe amp ([place])not at [facility] - Culture, beta strep (group b only)  Term labor symptoms and general obstetric precautions including but not limited to vaginal  bleeding, contractions, leaking of fluid and fetal movement were reviewed in detail with the patient. Please refer to After Visit Summary for other counseling recommendations.   Return in about 1 week (around 10/08/2019), or [facility].  Future Appointments  Date Time Provider [place]  10/08/2019  3:15 PM Geanie Kenning [facility] [place]  10/15/2019  1:15 PM Danielle Rankin [facility]  10/21/2019  8:15 AM [facility] NST [place], [place]

## 2019-10-01 NOTE — Patient Instructions (Signed)

## 2019-10-05 LAB — CULTURE, BETA STREP (GROUP B ONLY): Strep Gp B Culture: NEGATIVE

## 2019-10-07 LAB — GC/CHLAMYDIA PROBE AMP (~~LOC~~) NOT AT ARMC
Chlamydia: NEGATIVE
Comment: NEGATIVE
Comment: NORMAL
Neisseria Gonorrhea: NEGATIVE

## 2019-10-08 ENCOUNTER — Other Ambulatory Visit: Payer: Self-pay

## 2019-10-08 ENCOUNTER — Telehealth (INDEPENDENT_AMBULATORY_CARE_PROVIDER_SITE_OTHER): Payer: Medicaid Other

## 2019-10-08 VITALS — BP 124/78 | HR 81

## 2019-10-08 DIAGNOSIS — Z3A38 38 weeks gestation of pregnancy: Secondary | ICD-10-CM

## 2019-10-08 DIAGNOSIS — Z3493 Encounter for supervision of normal pregnancy, unspecified, third trimester: Secondary | ICD-10-CM

## 2019-10-08 NOTE — Progress Notes (Signed)
   TELEHEALTH OBSTETRICS PRENATAL VIRTUAL VIDEO VISIT ENCOUNTER NOTE  Provider location: Center for [facility] at [place]   I connected with Madeline Mccormick on 10/08/19 at  3:15 PM EDT by MyChart Video Encounter at home and verified that I am speaking with the correct person using two identifiers.   I discussed the limitations, risks, security and privacy concerns of performing an evaluation and management service virtually and the availability of in person appointments. I also discussed with the patient that there may be a patient responsible charge related to this service. The patient expressed understanding and agreed to proceed. Subjective:  Madeline Mccormick is a 23 y.o. G3P0020 at [redacted]w[redacted]d being seen today for ongoing prenatal care.  She is currently monitored for the following issues for this low-risk pregnancy and has Situational anxiety; Trochanteric bursitis of right hip; Depression, controlled; Prediabetes; Abdominal pain in pregnancy; and Supervision of low-risk pregnancy on their problem list.  Patient reports backache and pelvic pain.  Contractions: Not present. Vag. Bleeding: None.  Movement: Present. Denies any leaking of fluid.   Patient reports that she has noticed decreased fetal movement, but feels fetus move 3-5x/hr.  Patient states she feels more movement during night.    The following portions of the patient's history were reviewed and updated as appropriate: allergies, current medications, past family history, past medical history, past social history, past surgical history and problem list.   Objective:   Vitals:   10/08/19 1546  BP: 124/78  Pulse: 81    Fetal Status:     Movement: Present     General:  Alert, oriented and cooperative. Patient is in no acute distress.  Respiratory: Normal respiratory effort, no problems with respiration noted  Mental Status: Normal mood and affect. Normal behavior. Normal judgment and thought content.  Rest of physical exam deferred  due to type of encounter  Imaging: No results found.  Assessment and Plan:  Pregnancy: G3P0020 at [redacted]w[redacted]d  1. Encounter for supervision of low-risk pregnancy in third trimester -Labs reviewed from [facility]. -Discussed and reviewed postpartum planning including contraception, pediatricians, and infant feedings and circumcision desires/costs. *Patient desires to breastfeed and is considering postplacental IUD for contraception. -Reviewed decrease in fetal movement with infant growth, but instructed to report to MAU for further altered perception of movement. -Anticipatory guidance for upcoming visits. -Will provide information for circumcisions in community.  -Reviewed 5/1/1 with regards to labor.  Term labor symptoms and general obstetric precautions including but not limited to vaginal bleeding, contractions, leaking of fluid and fetal movement were reviewed in detail with the patient. I discussed the assessment and treatment plan with the patient. The patient was provided an opportunity to ask questions and all were answered. The patient agreed with the plan and demonstrated an understanding of the instructions. The patient was advised to call back or seek an in-person office evaluation/go to MAU at [facility] for any urgent or concerning symptoms. Please refer to After Visit Summary for other counseling recommendations.   I provided 10 minutes of face-to-face time during this encounter.  No follow-ups on file.  Future Appointments  Date Time Provider [place]  10/15/2019  1:15 PM Danielle Rankin [facility] [place]  10/21/2019  8:15 AM [facility] NST [place] for [facility], [place]

## 2019-10-08 NOTE — Patient Instructions (Signed)
Places to have your son circumcised:                                                                      [facility]     [phone]   $480 while you are in hospital         [facility]              [phone]   $269 by 4 wks                      [facility]                     [phone]   $269 by 7 days [facility]                    [phone]   $269 by 4 wks [facility]             [phone]   $225 by 2 wks    These prices sometimes change but are roughly what you can expect to pay. Please call and confirm pricing.   Circumcision is considered an elective/non-medically necessary procedure. There are many reasons parents decide to have their sons circumsized. During the first year of life circumcised males have a reduced risk of urinary tract infections but after this year the rates between circumcised males and uncircumcised males are the same.  It is safe to have your son circumcised outside of the hospital and the places above perform them regularly.   Deciding about Circumcision in Baby Boys  (Up-to-date The Basics)  What is circumcision?   Circumcision is a surgery that removes the skin that covers the tip of the penis, called the "foreskin" Circumcision is usually done when a boy is between 1 and 10 days old. In the [place], circumcision is common. In some other countries, fewer boys are circumcised. Circumcision is a common tradition in some religions.  Should I have my baby boy circumcised?   There is no easy answer. Circumcision has some benefits. But it also has risks. After talking with your doctor, you will have to decide for yourself what is right for your family.  What are the benefits of circumcision?   Circumcised boys seem to have slightly lower rates of: ?Urinary tract infections ?Swelling of the opening at the tip of the penis Circumcised men seem to have slightly lower rates of: ?Urinary tract infections ?Swelling of the opening at the tip of the penis ?Penis cancer ?HIV  and other infections that you catch during sex ?Cervical cancer in the women they have sex with Even so, in the [place], the risks of these problems are small - even in boys and men who have not been circumcised. Plus, boys and men who are not circumcised can reduce these extra risks by: ?Cleaning their penis well ?Using condoms during sex  What are the risks of circumcision?  Risks include: ?Bleeding or infection from the surgery ?Damage to or amputation of the penis ?A chance that the doctor will cut off too much or not enough of the foreskin ?A chance that sex won't feel as good later in life Only about 1 out of every 200   circumcisions leads to problems. There is also a chance that your health insurance won't pay for circumcision.  How is circumcision done in baby boys?  First, the baby gets medicine for pain relief. This might be a cream on the skin or a shot into the base of the penis. Next, the doctor cleans the baby's penis well. Then he or she uses special tools to cut off the foreskin. Finally, the doctor wraps a bandage (called gauze) around the baby's penis. If you have your baby circumcised, his doctor or nurse will give you instructions on how to care for him after the surgery. It is important that you follow those instructions carefully.  

## 2019-10-15 ENCOUNTER — Other Ambulatory Visit: Payer: Self-pay

## 2019-10-15 ENCOUNTER — Encounter: Payer: Self-pay | Admitting: Medical

## 2019-10-15 ENCOUNTER — Inpatient Hospital Stay (HOSPITAL_COMMUNITY): Payer: BC Managed Care – PPO | Admitting: Anesthesiology

## 2019-10-15 ENCOUNTER — Telehealth (INDEPENDENT_AMBULATORY_CARE_PROVIDER_SITE_OTHER): Payer: Medicaid Other | Admitting: Medical

## 2019-10-15 ENCOUNTER — Inpatient Hospital Stay (HOSPITAL_COMMUNITY)
Admission: AD | Admit: 2019-10-15 | Discharge: 2019-10-17 | DRG: 807 | Disposition: A | Discharge status: Home / Self Care | Payer: BC Managed Care – PPO | Attending: Family Medicine | Admitting: Family Medicine

## 2019-10-15 ENCOUNTER — Encounter (HOSPITAL_COMMUNITY): Payer: Self-pay

## 2019-10-15 DIAGNOSIS — Z20828 Contact with and (suspected) exposure to other viral communicable diseases: Secondary | ICD-10-CM | POA: Diagnosis not present

## 2019-10-15 DIAGNOSIS — Z87891 Personal history of nicotine dependence: Secondary | ICD-10-CM | POA: Diagnosis not present

## 2019-10-15 DIAGNOSIS — O134 Gestational [pregnancy-induced] hypertension without significant proteinuria, complicating childbirth: Principal | ICD-10-CM | POA: Diagnosis present

## 2019-10-15 DIAGNOSIS — Z3A39 39 weeks gestation of pregnancy: Secondary | ICD-10-CM | POA: Diagnosis not present

## 2019-10-15 DIAGNOSIS — Z23 Encounter for immunization: Secondary | ICD-10-CM

## 2019-10-15 DIAGNOSIS — D649 Anemia, unspecified: Secondary | ICD-10-CM | POA: Diagnosis present

## 2019-10-15 DIAGNOSIS — O133 Gestational [pregnancy-induced] hypertension without significant proteinuria, third trimester: Secondary | ICD-10-CM | POA: Diagnosis present

## 2019-10-15 DIAGNOSIS — O9902 Anemia complicating childbirth: Secondary | ICD-10-CM | POA: Diagnosis present

## 2019-10-15 DIAGNOSIS — Z3493 Encounter for supervision of normal pregnancy, unspecified, third trimester: Secondary | ICD-10-CM

## 2019-10-15 DIAGNOSIS — O2492 Unspecified diabetes mellitus in childbirth: Secondary | ICD-10-CM | POA: Diagnosis not present

## 2019-10-15 DIAGNOSIS — O99019 Anemia complicating pregnancy, unspecified trimester: Secondary | ICD-10-CM

## 2019-10-15 LAB — COMPREHENSIVE METABOLIC PANEL
ALT: 6 U/L (ref 0–44)
AST: 14 U/L — ABNORMAL LOW (ref 15–41)
Albumin: 2.9 g/dL — ABNORMAL LOW (ref 3.5–5.0)
Alkaline Phosphatase: 163 U/L — ABNORMAL HIGH (ref 38–126)
Anion gap: 10 (ref 5–15)
BUN: 6 mg/dL (ref 6–20)
CO2: 20 mmol/L — ABNORMAL LOW (ref 22–32)
Calcium: 8.6 mg/dL — ABNORMAL LOW (ref 8.9–10.3)
Chloride: 106 mmol/L (ref 98–111)
Creatinine, Ser: 0.62 mg/dL (ref 0.44–1.00)
GFR calc Af Amer: >60 mL/min (ref >60)
GFR calc non Af Amer: >60 mL/min (ref >60)
Glucose, Bld: 86 mg/dL (ref 70–99)
Potassium: 3.7 mmol/L (ref 3.5–5.1)
Sodium: 136 mmol/L (ref 135–145)
Total Bilirubin: 0.4 mg/dL (ref 0.3–1.2)
Total Protein: 6.1 g/dL — ABNORMAL LOW (ref 6.5–8.1)

## 2019-10-15 LAB — CBC
HCT: 31.7 % — ABNORMAL LOW (ref 36.0–46.0)
HCT: 31.8 % — ABNORMAL LOW (ref 36.0–46.0)
Hemoglobin: 9.9 g/dL — ABNORMAL LOW (ref 12.0–15.0)
Hemoglobin: 10.1 g/dL — ABNORMAL LOW (ref 12.0–15.0)
MCH: 28 pg (ref 26.0–34.0)
MCH: 28.3 pg (ref 26.0–34.0)
MCHC: 31.2 g/dL (ref 30.0–36.0)
MCHC: 31.8 g/dL (ref 30.0–36.0)
MCV: 89.8 fL (ref 80.0–100.0)
MCV: 89.1 fL (ref 80.0–100.0)
Platelets: 332 10*3/uL (ref 150–400)
Platelets: 314 10*3/uL (ref 150–400)
RBC: 3.53 MIL/uL — ABNORMAL LOW (ref 3.87–5.11)
RBC: 3.57 MIL/uL — ABNORMAL LOW (ref 3.87–5.11)
RDW: 12.8 % (ref 11.5–15.5)
RDW: 12.7 % (ref 11.5–15.5)
WBC: 10.7 10*3/uL — ABNORMAL HIGH (ref 4.0–10.5)
WBC: 14.1 10*3/uL — ABNORMAL HIGH (ref 4.0–10.5)
nRBC: 0 % (ref 0.0–0.2)
nRBC: 0 % (ref 0.0–0.2)

## 2019-10-15 LAB — URINALYSIS, ROUTINE W REFLEX MICROSCOPIC
Bilirubin Urine: NEGATIVE
Glucose, UA: NEGATIVE mg/dL
Ketones, ur: NEGATIVE mg/dL
Leukocytes,Ua: NEGATIVE
Nitrite: NEGATIVE
Protein, ur: 30 mg/dL — AB
Specific Gravity, Urine: 1.023 (ref 1.005–1.030)
pH: 5 (ref 5.0–8.0)

## 2019-10-15 LAB — PROTEIN / CREATININE RATIO, URINE
Creatinine, Urine: 189.98 mg/dL
Protein Creatinine Ratio: 0.11 mg/mg{Cre} (ref 0.00–0.15)
Total Protein, Urine: 20 mg/dL

## 2019-10-15 LAB — SARS CORONAVIRUS 2 BY RT PCR (HOSPITAL ORDER, PERFORMED IN ~~LOC~~ HOSPITAL LAB): SARS Coronavirus 2: NEGATIVE

## 2019-10-15 LAB — TYPE AND SCREEN
ABO/RH(D): A POS
Antibody Screen: NEGATIVE

## 2019-10-15 MED ORDER — ONDANSETRON HCL 4 MG/2ML IJ SOLN
4.0000 mg | Freq: Four times a day (QID) | INTRAMUSCULAR | Status: DC | PRN
  Administered 2019-10-16: 4 mg via INTRAVENOUS
  Filled 2019-10-15: qty 2

## 2019-10-15 MED ORDER — FLEET ENEMA 7-19 GM/118ML RE ENEM: 1.0000 | ENEMA | RECTAL | Status: DC | PRN

## 2019-10-15 MED ORDER — SOD CITRATE-CITRIC ACID 500-334 MG/5ML PO SOLN: 30.0000 mL | ORAL | Status: DC | PRN

## 2019-10-15 MED ORDER — PHENYLEPHRINE 40 MCG/ML (10ML) SYRINGE FOR IV PUSH (FOR BLOOD PRESSURE SUPPORT): 80.0000 ug | PREFILLED_SYRINGE | INTRAVENOUS | Status: DC | PRN

## 2019-10-15 MED ORDER — EPHEDRINE 5 MG/ML INJ: 10.0000 mg | INTRAVENOUS | Status: DC | PRN

## 2019-10-15 MED ORDER — FENTANYL-BUPIVACAINE-NACL 0.5-0.125-0.9 MG/250ML-% EP SOLN
12.0000 mL/h | EPIDURAL | Status: DC | PRN
  Administered 2019-10-16: 12 mL/h via EPIDURAL
  Filled 2019-10-15: qty 250

## 2019-10-15 MED ORDER — LIDOCAINE HCL (PF) 1 % IJ SOLN
30.0000 mL | INTRAMUSCULAR | Status: AC | PRN
  Administered 2019-10-16: 30 mL via SUBCUTANEOUS
  Filled 2019-10-15: qty 30

## 2019-10-15 MED ORDER — LACTATED RINGERS IV SOLN
INTRAVENOUS | Status: DC
  Administered 2019-10-15 – 2019-10-16 (×4): via INTRAVENOUS

## 2019-10-15 MED ORDER — OXYTOCIN 40 UNITS IN NORMAL SALINE INFUSION - SIMPLE MED
2.5000 [IU]/h | INTRAVENOUS | Status: DC
  Administered 2019-10-16: 2.5 [IU]/h via INTRAVENOUS

## 2019-10-15 MED ORDER — LACTATED RINGERS IV SOLN
500.0000 mL | Freq: Once | INTRAVENOUS | Status: AC
  Administered 2019-10-15: 500 mL via INTRAVENOUS

## 2019-10-15 MED ORDER — OXYCODONE-ACETAMINOPHEN 5-325 MG PO TABS: 1.0000 | ORAL_TABLET | ORAL | Status: DC | PRN

## 2019-10-15 MED ORDER — LACTATED RINGERS IV SOLN: 500.0000 mL | INTRAVENOUS | Status: DC | PRN

## 2019-10-15 MED ORDER — ACETAMINOPHEN 325 MG PO TABS: 650.0000 mg | ORAL_TABLET | ORAL | Status: DC | PRN

## 2019-10-15 MED ORDER — BUTALBITAL-APAP-CAFFEINE 50-325-40 MG PO TABS
2.0000 | ORAL_TABLET | Freq: Once | ORAL | Status: AC
  Administered 2019-10-15: 2 via ORAL
  Filled 2019-10-15: qty 2

## 2019-10-15 MED ORDER — PRENATAL MULTIVITAMIN CH
1.0000 | ORAL_TABLET | Freq: Every day | ORAL | Status: DC
  Administered 2019-10-16 – 2019-10-17 (×2): 1 via ORAL
  Filled 2019-10-15: qty 1

## 2019-10-15 MED ORDER — OXYTOCIN 40 UNITS IN NORMAL SALINE INFUSION - SIMPLE MED
1.0000 m[IU]/min | INTRAVENOUS | Status: DC
  Administered 2019-10-15: 2 m[IU]/min via INTRAVENOUS
  Filled 2019-10-15: qty 1000

## 2019-10-15 MED ORDER — OXYCODONE-ACETAMINOPHEN 5-325 MG PO TABS: 2.0000 | ORAL_TABLET | ORAL | Status: DC | PRN

## 2019-10-15 MED ORDER — ACETAMINOPHEN 500 MG PO TABS: 1000.0000 mg | ORAL_TABLET | Freq: Four times a day (QID) | ORAL | Status: DC | PRN

## 2019-10-15 MED ORDER — TERBUTALINE SULFATE 1 MG/ML IJ SOLN: 0.2500 mg | Freq: Once | INTRAMUSCULAR | Status: DC | PRN

## 2019-10-15 MED ORDER — FENTANYL CITRATE (PF) 100 MCG/2ML IJ SOLN
50.0000 ug | INTRAMUSCULAR | Status: DC | PRN
  Administered 2019-10-15: 50 ug via INTRAVENOUS
  Filled 2019-10-15: qty 2

## 2019-10-15 MED ORDER — OXYTOCIN BOLUS FROM INFUSION
500.0000 mL | Freq: Once | INTRAVENOUS | Status: AC
  Administered 2019-10-16: 500 mL via INTRAVENOUS

## 2019-10-15 MED ORDER — ESCITALOPRAM OXALATE 20 MG PO TABS
20.0000 mg | ORAL_TABLET | Freq: Every day | ORAL | Status: DC
  Administered 2019-10-15 – 2019-10-16 (×2): 20 mg via ORAL
  Filled 2019-10-15 (×3): qty 1

## 2019-10-15 MED ORDER — DIPHENHYDRAMINE HCL 50 MG/ML IJ SOLN
12.5000 mg | INTRAMUSCULAR | Status: DC | PRN
  Administered 2019-10-16: 12.5 mg via INTRAVENOUS
  Filled 2019-10-15: qty 1

## 2019-10-15 MED ORDER — LEVONORGESTREL 19.5 MCG/DAY IU IUD: INTRAUTERINE_SYSTEM | Freq: Once | INTRAUTERINE | Status: DC

## 2019-10-15 NOTE — H&P (Signed)
LABOR AND DELIVERY ADMISSION HISTORY AND PHYSICAL NOTE  Madeline Mccormick is a 23 y.o. female G3P0020 with IUP at [redacted]w[redacted]d by 7 wk [place] presenting for early labor and gHTN.   She reports positive fetal movement. She denies leakage of fluid or vaginal bleeding.   She denies chest pain, SOB, upper abdominal pain, LE edema.  She endorses a mild headache at present, has not been given any medicine. Also endorses some mild visual spots but only when she is getting up from laying down that last a few seconds. Has only happened today, three occasions but not consistently with positional changes.   She plans on breast feeding. She requests post-placental IUD for birth control.  Prenatal History/Complications: PNC at [facility]: @[redacted]w[redacted]d , CWD, normal anatomy, cephalic presentation, posterior placenta, 48%ile, EFW 278 grams  Pregnancy complications:  - gHTN  Past Medical History: Past Medical History:  Diagnosis Date  . ADHD    previously took Vivant - stopped 01/2019  . Allergy   . Anxiety   . Depression   . Diabetes mellitus without complication (HCC) 2015   Pre-diabetes  Hgb A1c = 5.8    Past Surgical History: Past Surgical History:  Procedure Laterality Date  . DENTAL SURGERY    . THERAPEUTIC ABORTION      Obstetrical History: OB History    Gravida  3   Para      Term      Preterm      AB  2   Living        SAB  1   TAB  1   Ectopic      Multiple      Live Births              Social History: Social History   Socioeconomic History  . Marital status: Single    Spouse name: Not on file  . Number of children: Not on file  . Years of education: Not on file  . Highest education level: Not on file  Occupational History    Comment: unemployed  Social Needs  . Financial resource strain: Not on file  . Food insecurity    Worry: Never true    Inability: Never true  . Transportation needs    Medical: No    Non-medical: No  Tobacco Use  . Smoking status: Former  Smoker    Packs/day: 0.50    Types: Cigarettes    Quit date: 02/05/2019    Years since quitting: 0.6  . Smokeless tobacco: Never Used  Substance and Sexual Activity  . Alcohol use: No    Alcohol/week: 0.0 standard drinks  . Drug use: No  . Sexual activity: Yes    Birth control/protection: None  Lifestyle  . Physical activity    Days per week: Not on file    Minutes per session: Not on file  . Stress: Not on file  Relationships  . Social 02/07/2019 on phone: Not on file    Gets together: Not on file    Attends religious service: Not on file    Active member of club or organization: Not on file    Attends meetings of clubs or organizations: Not on file    Relationship status: Not on file  Other Topics Concern  . Not on file  Social History Narrative  . Not on file    Family History: Family History  Problem Relation Age of Onset  . Arthritis Maternal Grandmother   .  Stroke Maternal Grandmother   . Sudden death Maternal Grandmother   . Depression Mother   . Diabetes Mother   . Thyroid disease Mother     Allergies: Allergies  Allergen Reactions  . Other     Medications Prior to Admission  Medication Sig Dispense Refill Last Dose  . escitalopram (LEXAPRO) 10 MG tablet Take 20 mg by mouth daily.    10/14/2019 at Unknown time  . Prenatal Vit-Fe Fumarate-FA (MULTIVITAMIN-PRENATAL) 27-0.8 MG TABS tablet Take 1 tablet by mouth daily at 12 noon.   10/14/2019 at Unknown time     Review of Systems  All systems reviewed and negative except as stated in HPI  Physical Exam Blood pressure (!) 143/80, pulse 80, temperature 97.9 F (36.6 C), temperature source Oral, resp. rate 17, height 5\' 4"  (1.626 m), weight 74.6 kg, last menstrual period 01/08/2019, SpO2 99 %, unknown if currently breastfeeding. General appearance: alert, oriented, NAD Lungs: normal respiratory effort Heart: regular rate Abdomen: soft, non-tender; gravid, leopolds 2800 grams Extremities: No  calf swelling or tenderness Presentation: cephalic by RN exam Fetal monitoringBaseline: 120 bpm, Variability: Good {> 6 bpm), Accelerations: Reactive and Decelerations: Absent Uterine activity: poor tracing Dilation: 3 Effacement (%): 100 Station: -1 Exam by:: Manya Silvas, CNM  Prenatal labs: ABO, Rh: --/--/A POS (10/27 1515) Antibody: NEG (10/27 1515) Rubella: 3.18 (05/01 0932) RPR: Non Reactive (08/12 0920)  HBsAg: Negative (05/01 0932)  HIV: Non Reactive (08/12 0920)  GC/Chlamydia: neg/neg 10/01/2019  GBS: Negative/-- (10/13 1529)  2-hr GTT: normal 07/31/2019 Genetic screening:  Low risk Panorama, female Anatomy US: normal  Prenatal Transfer Tool  Maternal Diabetes: No Genetic Screening: Normal Maternal Ultrasounds/Referrals: Normal Fetal Ultrasounds or other Referrals:  None Maternal Substance Abuse:  No Significant Maternal Medications:  None Significant Maternal Lab Results: Group B Strep negative  Results for orders placed or performed during the hospital encounter of 10/15/19 (from the past 24 hour(s))  CBC   Collection Time: 10/15/19  3:15 PM  Result Value Ref Range   WBC 10.7 (H) 4.0 - 10.5 K/uL   RBC 3.53 (L) 3.87 - 5.11 MIL/uL   Hemoglobin 9.9 (L) 12.0 - 15.0 g/dL   HCT 31.7 (L) 36.0 - 46.0 %   MCV 89.8 80.0 - 100.0 fL   MCH 28.0 26.0 - 34.0 pg   MCHC 31.2 30.0 - 36.0 g/dL   RDW 12.8 11.5 - 15.5 %   Platelets 332 150 - 400 K/uL   nRBC 0.0 0.0 - 0.2 %  Comprehensive metabolic panel   Collection Time: 10/15/19  3:15 PM  Result Value Ref Range   Sodium 136 135 - 145 mmol/L   Potassium 3.7 3.5 - 5.1 mmol/L   Chloride 106 98 - 111 mmol/L   CO2 20 (L) 22 - 32 mmol/L   Glucose, Bld 86 70 - 99 mg/dL   BUN 6 6 - 20 mg/dL   Creatinine, Ser 0.62 0.44 - 1.00 mg/dL   Calcium 8.6 (L) 8.9 - 10.3 mg/dL   Total Protein 6.1 (L) 6.5 - 8.1 g/dL   Albumin 2.9 (L) 3.5 - 5.0 g/dL   AST 14 (L) 15 - 41 U/L   ALT 6 0 - 44 U/L   Alkaline Phosphatase 163 (H) 38 - 126 U/L    Total Bilirubin 0.4 0.3 - 1.2 mg/dL   GFR calc non Af Amer >60 >60 mL/min   GFR calc Af Amer >60 >60 mL/min   Anion gap 10 5 - 15  Type and  screen   Collection Time: 10/15/19  3:15 PM  Result Value Ref Range   ABO/RH(D) A POS    Antibody Screen NEG    Sample Expiration      10/18/2019,2359 Performed at [facility], [address] [place], [place] [address]   Protein / creatinine ratio, urine   Collection Time: 10/15/19  3:20 PM  Result Value Ref Range   Creatinine, Urine 189.98 mg/dL   Total Protein, Urine 20 mg/dL   Protein Creatinine Ratio 0.11 0.00 - 0.15 mg/mg[Cre]  Urinalysis, Routine w reflex microscopic   Collection Time: 10/15/19  3:20 PM  Result Value Ref Range   Color, Urine YELLOW YELLOW   APPearance HAZY (A) CLEAR   Specific Gravity, Urine 1.023 1.005 - 1.030   pH 5.0 5.0 - 8.0   Glucose, UA NEGATIVE NEGATIVE mg/dL   Hgb urine dipstick SMALL (A) NEGATIVE   Bilirubin Urine NEGATIVE NEGATIVE   Ketones, ur NEGATIVE NEGATIVE mg/dL   Protein, ur 30 (A) NEGATIVE mg/dL   Nitrite NEGATIVE NEGATIVE   Leukocytes,Ua NEGATIVE NEGATIVE   RBC / HPF 0-5 0 - 5 RBC/hpf   WBC, UA 0-5 0 - 5 WBC/hpf   Bacteria, UA RARE (A) NONE SEEN   Squamous Epithelial / LPF 21-50 0 - 5    Patient Active Problem List   Diagnosis Date Noted  . Supervision of low-risk pregnancy 04/09/2019  . Abdominal pain in pregnancy 03/06/2019  . Prediabetes 11/18/2014  . Depression, controlled 05/04/2014  . Trochanteric bursitis of right hip 04/30/2014  . Situational anxiety 10/08/2013    Assessment: [place] Mccormick is a 23 y.o. G3P0020 at 2160w0d here for early labor and gHTN  #Labor: Making cervical change and contracting regularly, allow period of expectant management and augment PRN #Pain: IV pain meds PRN, epidural PRN #FWB: Cat I #GBS/ID:  negative #COVID: swab pending #MOF: breast #MOC: post-placental IUD #Circ:  Father reports history of possible hypospadias as an infant and  needing to wait until he was 23 years old to be circumcised. Discussed that depending on exam may need to defer but if normal anatomy can likely be performed in house.   #gHTN: PIH labs normal, no severe range pressures. Mild headache at present, will treat with Fiorecet and reassess. Visual symptoms seem more vagal as not persistent at all times. Defer Mg at this time but low threshold to initiate.   Mary SellaMatthew M [facility] 10/15/2019, 6:25 PM

## 2019-10-15 NOTE — Progress Notes (Signed)
Madeline Mccormick is a 23 y.o. G3P0020 at [redacted]w[redacted]d admitted for early labor, gHTN  Subjective: Feeling contractions, IV pain meds not providing sufficient relief. Feeling baby move, no LOF.  Objective: BP 137/80   Pulse 81   Temp 98.7 F (37.1 C) (Oral)   Resp 19   Ht 5\' 4"  (1.626 m)   Wt 74.6 kg   LMP 01/08/2019   SpO2 99%   BMI 28.24 kg/m  No intake/output data recorded.  FHT:  FHR: 120 bpm, variability: moderate,  accelerations:  Present,  decelerations:  Absent UC:   irregular, every 2-6 minutes  SVE:   Dilation: 3 Effacement (%): 100 Station: -1 Exam by:: Kepler Mccabe, DO  Labs: Lab Results  Component Value Date   WBC 14.1 (H) 10/15/2019   HGB 10.1 (L) 10/15/2019   HCT 31.8 (L) 10/15/2019   MCV 89.1 10/15/2019   PLT 314 10/15/2019    Assessment / Plan: Madeline Lewis is a 23 y.o. G3P0020 at [redacted]w[redacted]d here for early labor and gHTN  #Labor: No cervical change, despite painful contractions. Discussed augmentation with pitocin, patient amenable, will start. Consider AROM as appropriate. #Pain:  requesting epidural, IV pain medication not providing sufficient relief #FWB: Cat I #GBS/ID:   negative #COVID: swab negative #MOF: breast #MOC: post-placental IUD #Circ:   Father reports history of possible hypospadias as an infant and needing to wait until he was 64 years old to be circumcised. Discussed that depending on exam may need to defer but if normal anatomy can likely be performed in house.   #gHTN: PIH labs normal, no severe range pressures. Mild headache resolved with Fiorecet. Visual symptoms seem more vagal as not persistent at all times.   Merilyn Baba DO OB Fellow, [facility] 10/15/2019, 10:06 PM

## 2019-10-15 NOTE — Patient Instructions (Signed)
Fetal Movement Counts Patient Name: ________________________________________________ Patient Due Date: ____________________ What is a fetal movement count?  A fetal movement count is the number of times that you feel your baby move during a certain amount of time. This may also be called a fetal kick count. A fetal movement count is recommended for every pregnant woman. You may be asked to start counting fetal movements as early as week 28 of your pregnancy. Pay attention to when your baby is most active. You may notice your baby's sleep and wake cycles. You may also notice things that make your baby move more. You should do a fetal movement count:  When your baby is normally most active.  At the same time each day. A good time to count movements is while you are resting, after having something to eat and drink. How do I count fetal movements? 1. Find a quiet, comfortable area. Sit, or lie down on your side. 2. Write down the date, the start time and stop time, and the number of movements that you felt between those two times. Take this information with you to your health care visits. 3. For 2 hours, count kicks, flutters, swishes, rolls, and jabs. You should feel at least 10 movements during 2 hours. 4. You may stop counting after you have felt 10 movements. 5. If you do not feel 10 movements in 2 hours, have something to eat and drink. Then, keep resting and counting for 1 hour. If you feel at least 4 movements during that hour, you may stop counting. Contact a health care provider if:  You feel fewer than 4 movements in 2 hours.  Your baby is not moving like he or she usually does. Date: ____________ Start time: ____________ Stop time: ____________ Movements: ____________ Date: ____________ Start time: ____________ Stop time: ____________ Movements: ____________ Date: ____________ Start time: ____________ Stop time: ____________ Movements: ____________ Date: ____________ Start time:  ____________ Stop time: ____________ Movements: ____________ Date: ____________ Start time: ____________ Stop time: ____________ Movements: ____________ Date: ____________ Start time: ____________ Stop time: ____________ Movements: ____________ Date: ____________ Start time: ____________ Stop time: ____________ Movements: ____________ Date: ____________ Start time: ____________ Stop time: ____________ Movements: ____________ Date: ____________ Start time: ____________ Stop time: ____________ Movements: ____________ This information is not intended to replace advice given to you by your health care provider. Make sure you discuss any questions you have with your health care provider. Document Released: 01/04/2007 Document Revised: 12/25/2018 Document Reviewed: 01/14/2016 Elsevier Patient Education  2020 Else[employer] Braxton Hicks Contractions Contractions of the uterus can occur throughout pregnancy, but they are not always a sign that you are in labor. You may have practice contractions called Braxton Hicks contractions. These false labor contractions are sometimes confused with true labor. What are Braxton Hicks contractions? Braxton Hicks contractions are tightening movements that occur in the muscles of the uterus before labor. Unlike true labor contractions, these contractions do not result in opening (dilation) and thinning of the cervix. Toward the end of pregnancy (32-34 weeks), Braxton Hicks contractions can happen more often and may become stronger. These contractions are sometimes difficult to tell apart from true labor because they can be very uncomfortable. You should not feel embarrassed if you go to the hospital with false labor. Sometimes, the only way to tell if you are in true labor is for your health care provider to look for changes in the cervix. The health care provider will do a physical exam and may monitor your contractions. If you   are not in true labor, the exam should show  that your cervix is not dilating and your water has not broken. If there are no other health problems associated with your pregnancy, it is completely safe for you to be sent home with false labor. You may continue to have Braxton Hicks contractions until you go into true labor. How to tell the difference between true labor and false labor True labor  Contractions last 30-70 seconds.  Contractions become very regular.  Discomfort is usually felt in the top of the uterus, and it spreads to the lower abdomen and low back.  Contractions do not go away with walking.  Contractions usually become more intense and increase in frequency.  The cervix dilates and gets thinner. False labor  Contractions are usually shorter and not as strong as true labor contractions.  Contractions are usually irregular.  Contractions are often felt in the front of the lower abdomen and in the groin.  Contractions may go away when you walk around or change positions while lying down.  Contractions get weaker and are shorter-lasting as time goes on.  The cervix usually does not dilate or become thin. Follow these instructions at home:   Take over-the-counter and prescription medicines only as told by your health care provider.  Keep up with your usual exercises and follow other instructions from your health care provider.  Eat and drink lightly if you think you are going into labor.  If Braxton Hicks contractions are making you uncomfortable: ? Change your position from lying down or resting to walking, or change from walking to resting. ? Sit and rest in a tub of warm water. ? Drink enough fluid to keep your urine pale yellow. Dehydration may cause these contractions. ? Do slow and deep breathing several times an hour.  Keep all follow-up prenatal visits as told by your health care provider. This is important. Contact a health care provider if:  You have a fever.  You have continuous pain in  your abdomen. Get help right away if:  Your contractions become stronger, more regular, and closer together.  You have fluid leaking or gushing from your vagina.  You pass blood-tinged mucus (bloody show).  You have bleeding from your vagina.  You have low back pain that you never had before.  You feel your baby's head pushing down and causing pelvic pressure.  Your baby is not moving inside you as much as it used to. Summary  Contractions that occur before labor are called Braxton Hicks contractions, false labor, or practice contractions.  Braxton Hicks contractions are usually shorter, weaker, farther apart, and less regular than true labor contractions. True labor contractions usually become progressively stronger and regular, and they become more frequent.  Manage discomfort from Braxton Hicks contractions by changing position, resting in a warm bath, drinking plenty of water, or practicing deep breathing. This information is not intended to replace advice given to you by your health care provider. Make sure you discuss any questions you have with your health care provider. Document Released: 04/20/2017 Document Revised: 11/17/2017 Document Reviewed: 04/20/2017 Elsevier Patient Education  2020 Elsevier Inc.  

## 2019-10-15 NOTE — Progress Notes (Signed)
I connected with Madeline Mccormick  on 10/15/19 at  1:15 PM EDT by: MyChart and verified that I am speaking with the correct person using two identifiers.  Patient is located at home and provider is located at [facility].     The purpose of this virtual visit is to provide medical care while limiting exposure to the novel coronavirus. I discussed the limitations, risks, security and privacy concerns of performing an evaluation and management service by MyChart and the availability of in person appointments. I also discussed with the patient that there may be a patient responsible charge related to this service. By engaging in this virtual visit, you consent to the provision of healthcare.  Additionally, you authorize for your insurance to be billed for the services provided during this visit.  The patient expressed understanding and agreed to proceed.  The following staff members participated in the virtual visit:  Lowanda Foster, [place] VISIT NOTE  Subjective:  Madeline Branscum is a 23 y.o. G3P0020 at [redacted]w[redacted]d  for phone visit for ongoing prenatal care.  She is currently monitored for the following issues for this low-risk pregnancy and has Situational anxiety; Trochanteric bursitis of right hip; Depression, controlled; Prediabetes; Abdominal pain in pregnancy; and Supervision of low-risk pregnancy on their problem list.  Patient reports contractions since this morning, pink discharge.  Contractions: Irregular. Vag. Bleeding: None.  Movement: Present. Denies leaking of fluid.   The following portions of the patient's history were reviewed and updated as appropriate: allergies, current medications, past family history, past medical history, past social history, past surgical history and problem list.   Objective:   Vitals:   10/15/19 1318  BP: (!) 143/87  Pulse: 76   Self-Obtained  Recheck BP still elevated   Fetal Status:     Movement: Present     Assessment and Plan:  Pregnancy: G3P0020 at  [redacted]w[redacted]d 1. Encounter for supervision of low-risk pregnancy in third trimester - Contractions q 8 minutes since this morning - BP elevated x 2  - Patient advised to go to [facility] for further evaluation - [facility] called with report   Term labor symptoms and general obstetric precautions including but not limited to vaginal bleeding, contractions, leaking of fluid and fetal movement were reviewed in detail with the patient.  No follow-ups on file.  Future Appointments  Date Time Provider [place]  10/21/2019  8:15 AM [facility]ST [facility]OC     Time spent on virtual visit: 10 minutes  Kerry Hough, PA-C

## 2019-10-15 NOTE — MAU Provider Note (Signed)
Chief Complaint  Patient presents with  . Hypertension  . Headache  . Contractions     First Provider Initiated Contact with Patient 10/15/19 1524      S: Madeline Mccormick  is a 23 y.o. y.o. year old G34P0020 female at [redacted]w[redacted]d weeks gestation who presents to MAU with elevated blood pressures at virtual prenatal visit today: 140/90, 143/87 on home cuff.  No Hx HTN. Current blood pressure medication: None  Associated symptoms: Denies headache, vision changes, epigastric pain.  Had mild headache 3/10 earlier today that resolved spontaneously. Contractions: Every 5 minutes, moderate-strong Vaginal bleeding: Scant bloody show Fetal movement: Active  O:  Patient Vitals for the past 24 hrs:  BP Temp Temp src Pulse Resp SpO2 Height Weight  10/15/19 1805 (!) 143/80 - - 80 - 99 % - -  10/15/19 1745 (!) 155/90 - - 71 - 98 % - -  10/15/19 1730 138/78 - - 69 - 99 % - -  10/15/19 1715 (!) 157/94 - - 67 - 99 % - -  10/15/19 1700 (!) 151/80 - - 69 - 99 % - -  10/15/19 1645 135/77 - - 66 - 99 % - -  10/15/19 1630 130/71 - - 65 - 99 % - -  10/15/19 1615 136/77 - - 65 - 99 % - -  10/15/19 1600 135/80 - - 63 - 99 % - -  10/15/19 1546 128/84 - - 62 - - - -  10/15/19 1531 133/80 - - 67 - - - -  10/15/19 1516 139/82 - - 68 - - - -  10/15/19 1512 137/83 - - 68 - - - -  10/15/19 1454 134/82 97.9 F (36.6 C) Oral 75 17 100 % 5\' 4"  (1.626 m) 74.6 kg   General: Mild-moderate discomfort with contractions Heart: Regular rate Lungs: Normal rate and effort Abd: Soft, NT, Gravid, S=D Extremities: 1+ Pedal edema Neuro: 2+ deep tendon reflexes, No clonus Pelvic: NEFG, no bleeding or LOF.   Dilation: 1.5 Effacement (%): 90 Cervical Position: Posterior Station: -2 Presentation: Vertex Exam by:: [email or web address] CNM  EFM: 140, Moderate variability, 15 x 15 accelerations, no decelerations Toco: Q 5, moderate  Results for orders placed or performed during the hospital encounter of 10/15/19 (from the past 24 hour(s))   CBC     Status: Abnormal   Collection Time: 10/15/19  3:15 PM  Result Value Ref Range   WBC 10.7 (H) 4.0 - 10.5 K/uL   RBC 3.53 (L) 3.87 - 5.11 MIL/uL   Hemoglobin 9.9 (L) 12.0 - 15.0 g/dL   HCT 10/17/19 (L) 64.3 - 32.9 %   MCV 89.8 80.0 - 100.0 fL   MCH 28.0 26.0 - 34.0 pg   MCHC 31.2 30.0 - 36.0 g/dL   RDW 51.8 84.1 - 66.0 %   Platelets 332 150 - 400 K/uL   nRBC 0.0 0.0 - 0.2 %  Comprehensive metabolic panel     Status: Abnormal   Collection Time: 10/15/19  3:15 PM  Result Value Ref Range   Sodium 136 135 - 145 mmol/L   Potassium 3.7 3.5 - 5.1 mmol/L   Chloride 106 98 - 111 mmol/L   CO2 20 (L) 22 - 32 mmol/L   Glucose, Bld 86 70 - 99 mg/dL   BUN 6 6 - 20 mg/dL   Creatinine, Ser 10/17/19 0.44 - 1.00 mg/dL   Calcium 8.6 (L) 8.9 - 10.3 mg/dL   Total Protein 6.1 (L) 6.5 - 8.1 g/dL  Albumin 2.9 (L) 3.5 - 5.0 g/dL   AST 14 (L) 15 - 41 U/L   ALT 6 0 - 44 U/L   Alkaline Phosphatase 163 (H) 38 - 126 U/L   Total Bilirubin 0.4 0.3 - 1.2 mg/dL   GFR calc non Af Amer >60 >60 mL/min   GFR calc Af Amer >60 >60 mL/min   Anion gap 10 5 - 15  Type and screen     Status: None   Collection Time: 10/15/19  3:15 PM  Result Value Ref Range   ABO/RH(D) A POS    Antibody Screen NEG    Sample Expiration      10/18/2019,2359 Performed at [facility], [place] [address], [place], [place] [address]   Protein / creatinine ratio, urine     Status: None   Collection Time: 10/15/19  3:20 PM  Result Value Ref Range   Creatinine, Urine 189.98 mg/dL   Total Protein, Urine 20 mg/dL   Protein Creatinine Ratio 0.11 0.00 - 0.15 mg/mg[Cre]  Urinalysis, Routine w reflex microscopic     Status: Abnormal   Collection Time: 10/15/19  3:20 PM  Result Value Ref Range   Color, Urine YELLOW YELLOW   APPearance HAZY (A) CLEAR   Specific Gravity, Urine 1.023 1.005 - 1.030   pH 5.0 5.0 - 8.0   Glucose, UA NEGATIVE NEGATIVE mg/dL   Hgb urine dipstick SMALL (A) NEGATIVE   Bilirubin Urine NEGATIVE NEGATIVE    Ketones, ur NEGATIVE NEGATIVE mg/dL   Protein, ur 30 (A) NEGATIVE mg/dL   Nitrite NEGATIVE NEGATIVE   Leukocytes,Ua NEGATIVE NEGATIVE   RBC / HPF 0-5 0 - 5 RBC/hpf   WBC, UA 0-5 0 - 5 WBC/hpf   Bacteria, UA RARE (A) NONE SEEN   Squamous Epithelial / LPF 21-50 0 - 5   MAU Course Pt more uncomfortable w/ Contractions. Slight cervical change.   Dilation: 2.5 Effacement (%): 90 Cervical Position: Posterior Station: -2 Presentation: Vertex Exam by:: Manya Silvas, CNM  Dilation: 3 Effacement (%): 100 Cervical Position: Middle Station: -1 Presentation: Vertex Exam by:: Manya Silvas, CNM  Discussed Hx, labs, exam w/ Dr. Nehemiah Settle. Agrees w/ POC. New orders: Admit for gestational hypertension and early labor.   When seen and returned to room patient reported that she was seeing stars when she got the labs drawn.  Will report those to L&D provider, but suspect this is positional and anemia related, not scotoma of preeclampsia.  A: [redacted]w[redacted]d week IUP Gestational hypertension Early labor FHR reactive  P: Admit to labor and delivery per [place] Routine L&D orders May have epidural Continue to observe for symptoms of preeclampsia.  Magnesium sulfate as needed for preeclampsia with severe features.  Madeline Mccormick, [place], [place] 10/15/2019 6:59 PM

## 2019-10-15 NOTE — Progress Notes (Signed)
I connected with  Madeline Mccormick on 10/15/19 at  1:15 PM EDT by telephone and verified that I am speaking with the correct person using two identifiers.   I discussed the limitations, risks, security and privacy concerns of performing an evaluation and management service by telephone and the availability of in person appointments. I also discussed with the patient that there may be a patient responsible charge related to this service. The patient expressed understanding and agreed to proceed.  [place], CMA 10/15/2019  1:17 PM

## 2019-10-15 NOTE — MAU Note (Signed)
Been having contractions since 9 this morning,  Getting closer.  Had a little pink bleeding this morning, no leaking. virtual appt this morning, BP was elevated, this is new.  Has a  HA, did not take anything for it. Ankles are swollen.   Denies visual changes or epigastric pain.

## 2019-10-16 ENCOUNTER — Encounter (HOSPITAL_COMMUNITY): Payer: Self-pay | Admitting: *Deleted

## 2019-10-16 DIAGNOSIS — O134 Gestational [pregnancy-induced] hypertension without significant proteinuria, complicating childbirth: Secondary | ICD-10-CM

## 2019-10-16 LAB — RPR: RPR Ser Ql: NONREACTIVE

## 2019-10-16 MED ORDER — WITCH HAZEL-GLYCERIN EX PADS: 1.0000 "application " | MEDICATED_PAD | CUTANEOUS | Status: DC | PRN

## 2019-10-16 MED ORDER — DIPHENHYDRAMINE HCL 50 MG/ML IJ SOLN: 12.5000 mg | INTRAMUSCULAR | Status: DC | PRN

## 2019-10-16 MED ORDER — DIPHENHYDRAMINE HCL 25 MG PO CAPS: 25.0000 mg | ORAL_CAPSULE | Freq: Four times a day (QID) | ORAL | Status: DC | PRN

## 2019-10-16 MED ORDER — COCONUT OIL OIL
1.0000 "application " | TOPICAL_OIL | Status: DC | PRN
  Administered 2019-10-17: 1 via TOPICAL

## 2019-10-16 MED ORDER — SIMETHICONE 80 MG PO CHEW: 80.0000 mg | CHEWABLE_TABLET | ORAL | Status: DC | PRN

## 2019-10-16 MED ORDER — IBUPROFEN 600 MG PO TABS
600.0000 mg | ORAL_TABLET | Freq: Four times a day (QID) | ORAL | Status: DC
  Administered 2019-10-16 – 2019-10-17 (×4): 600 mg via ORAL
  Filled 2019-10-16 (×4): qty 1

## 2019-10-16 MED ORDER — FENTANYL-BUPIVACAINE-NACL 0.5-0.125-0.9 MG/250ML-% EP SOLN: 12.0000 mL/h | EPIDURAL | Status: DC | PRN

## 2019-10-16 MED ORDER — SODIUM CHLORIDE (PF) 0.9 % IJ SOLN
INTRAMUSCULAR | Status: DC | PRN
  Administered 2019-10-15: 12 mL/h via EPIDURAL

## 2019-10-16 MED ORDER — ACETAMINOPHEN 325 MG PO TABS
650.0000 mg | ORAL_TABLET | ORAL | Status: DC | PRN
  Administered 2019-10-16 (×2): 650 mg via ORAL
  Filled 2019-10-16 (×2): qty 2

## 2019-10-16 MED ORDER — BENZOCAINE-MENTHOL 20-0.5 % EX AERO
1.0000 "application " | INHALATION_SPRAY | CUTANEOUS | Status: DC | PRN
  Administered 2019-10-16: 1 via TOPICAL
  Filled 2019-10-16: qty 56

## 2019-10-16 MED ORDER — INFLUENZA VAC SPLIT QUAD 0.5 ML IM SUSY
0.5000 mL | PREFILLED_SYRINGE | INTRAMUSCULAR | Status: AC
  Administered 2019-10-17: 0.5 mL via INTRAMUSCULAR
  Filled 2019-10-16: qty 0.5

## 2019-10-16 MED ORDER — TETANUS-DIPHTH-ACELL PERTUSSIS 5-2.5-18.5 LF-MCG/0.5 IM SUSP: 0.5000 mL | Freq: Once | INTRAMUSCULAR | Status: DC

## 2019-10-16 MED ORDER — ONDANSETRON HCL 4 MG/2ML IJ SOLN: 4.0000 mg | INTRAMUSCULAR | Status: DC | PRN

## 2019-10-16 MED ORDER — ONDANSETRON HCL 4 MG PO TABS: 4.0000 mg | ORAL_TABLET | ORAL | Status: DC | PRN

## 2019-10-16 MED ORDER — DIBUCAINE (PERIANAL) 1 % EX OINT: 1.0000 "application " | TOPICAL_OINTMENT | CUTANEOUS | Status: DC | PRN

## 2019-10-16 MED ORDER — LIDOCAINE HCL (PF) 1 % IJ SOLN
INTRAMUSCULAR | Status: DC | PRN
  Administered 2019-10-15: 6 mL via EPIDURAL

## 2019-10-16 MED ORDER — ZOLPIDEM TARTRATE 5 MG PO TABS: 5.0000 mg | ORAL_TABLET | Freq: Every evening | ORAL | Status: DC | PRN

## 2019-10-16 MED ORDER — SENNOSIDES-DOCUSATE SODIUM 8.6-50 MG PO TABS
2.0000 | ORAL_TABLET | ORAL | Status: DC
  Administered 2019-10-16: 2 via ORAL
  Filled 2019-10-16: qty 2

## 2019-10-16 NOTE — Anesthesia Preprocedure Evaluation (Signed)
Anesthesia Evaluation  Patient identified by MRN, date of birth, ID band Patient awake    Reviewed: Allergy & Precautions, H&P , NPO status , Patient's Chart, lab work & pertinent test results, reviewed documented beta blocker date and time   Airway Mallampati: II  TM Distance: >3 FB Neck ROM: full    Dental no notable dental hx. (+) Teeth Intact   Pulmonary neg pulmonary ROS, former smoker,    Pulmonary exam normal breath sounds clear to auscultation       Cardiovascular negative cardio ROS Normal cardiovascular exam Rhythm:regular Rate:Normal     Neuro/Psych negative neurological ROS  negative psych ROS   GI/Hepatic negative GI ROS, Neg liver ROS,   Endo/Other  negative endocrine ROSdiabetes  Renal/GU negative Renal ROS  negative genitourinary   Musculoskeletal   Abdominal   Peds  Hematology negative hematology ROS (+)   Anesthesia Other Findings   Reproductive/Obstetrics (+) Pregnancy                             Anesthesia Physical Anesthesia Plan  ASA: II  Anesthesia Plan: Epidural   Post-op Pain Management:    Induction:   PONV Risk Score and Plan:   Airway Management Planned:   Additional Equipment:   Intra-op Plan:   Post-operative Plan:   Informed Consent: I have reviewed the patients History and Physical, chart, labs and discussed the procedure including the risks, benefits and alternatives for the proposed anesthesia with the patient or authorized representative who has indicated his/her understanding and acceptance.     Dental Advisory Given  Plan Discussed with:   Anesthesia Plan Comments: (Labs checked- platelets confirmed with RN in room. Fetal heart tracing, per RN, reported to be stable enough for sitting procedure. Discussed epidural, and patient consents to the procedure:  included risk of possible headache,backache, failed block, allergic reaction,  and nerve injury. This patient was asked if she had any questions or concerns before the procedure started.)        Anesthesia Quick Evaluation

## 2019-10-16 NOTE — Anesthesia Procedure Notes (Signed)
Epidural Patient location during procedure: OB Start time: 10/15/2019 11:10 PM End time: 10/15/2019 11:15 PM  Staffing Anesthesiologist: Janeece Riggers, MD  Preanesthetic Checklist Completed: patient identified, site marked, surgical consent, pre-op evaluation, timeout performed, IV checked, risks and benefits discussed and monitors and equipment checked  Epidural Patient position: sitting Prep: site prepped and draped and DuraPrep Patient monitoring: continuous pulse ox and blood pressure Approach: midline Location: L3-L4 Injection technique: LOR air  Needle:  Needle type: Tuohy  Needle gauge: 17 G Needle length: 9 cm and 9 Needle insertion depth: 6 cm Catheter type: closed end flexible Catheter size: 19 Gauge Catheter at skin depth: 11 cm Test dose: negative  Assessment Events: blood not aspirated, injection not painful, no injection resistance, negative IV test and no paresthesia

## 2019-10-16 NOTE — Progress Notes (Signed)
[name] was referred for history of depression/anxiety.  * Referral screened out by Clinical Social Worker because none of the following criteria appear to apply:  ~ History of anxiety/depression during this pregnancy, or of post-partum depression following prior delivery. ~ Diagnosis of anxiety and/or depression within last 3 years. [name]'s anxiety/depression appear to date back to 2014. OR * [name]'s symptoms currently being treated with medication and/or therapy. [name] currently taking Lexapro with no concerns noted in [facility] records.  Please contact the Clinical Social Worker if needs arise, by [facility] request, or if [name] scores greater than 9/yes to question 10 on Edinburgh Postpartum Depression Screen.  Madeline Mccormick, [place]  [facility] and [employer] [phone]

## 2019-10-16 NOTE — Progress Notes (Signed)
Madeline Mccormick is a 23 y.o. G3P0020 at [redacted]w[redacted]d admitted for early labor, gHTN  Subjective: Feeling nauseated with contractions  Objective: BP 129/84   Pulse 92   Temp 98.7 F (37.1 C) (Oral)   Resp 19   Ht 5\' 4"  (1.626 m)   Wt 74.6 kg   LMP 01/08/2019   SpO2 100%   BMI 28.24 kg/m  No intake/output data recorded.  FHT:  FHR: 125 bpm, variability: moderate,  accelerations:  Present,  decelerations:  Absent UC:   regular, every 2 minutes  SVE:   Dilation: 8 Effacement (%): 100 Station: 0 Exam by:: Hector Shade, RN  Pitocin @ 4 mu/min  Labs: Lab Results  Component Value Date   WBC 14.1 (H) 10/15/2019   HGB 10.1 (L) 10/15/2019   HCT 31.8 (L) 10/15/2019   MCV 89.1 10/15/2019   PLT 314 10/15/2019    Assessment / Plan: Madeline Craigis a 52 y.W.V3X1062 at [redacted]w[redacted]d here for early labor and gHTN  #Labor:Progressing on Pitocin (started at 2230). AROM with light mec. #Pain:Comfortable with epidural #FWB:Cat I #GBS/ID:negative #MOC:post-placental IUD #Circ:Father reports history of possible hypospadias as an infant and needing to wait until he was 47 years old to be circumcised. Discussed that depending on exam may need to defer but if normal anatomy can likely be performed in house.  #gHTN: Pre-E labs normal, no severe range pressures. No new headaches or visual symptoms. Visual symptoms seem more vagal as not persistent at all times.  Merilyn Baba DO OB Fellow, Faculty Practice 10/16/2019, 3:55 AM

## 2019-10-16 NOTE — Progress Notes (Signed)
Martinique Dowers is a 23 y.o. G3P0020 at [redacted]w[redacted]d admitted for early labor, gHTN  Subjective: Comfortable with epidural, making cervical change on Pitocin. Still feeling uncomfortable with contractions, but the epidural is providing adequate relief  Objective: BP 134/76 (BP Location: Left Arm)   Pulse 74   Temp 98.7 F (37.1 C) (Oral)   Resp 19   Ht 5\' 4"  (1.626 m)   Wt 74.6 kg   LMP 01/08/2019   SpO2 100%   BMI 28.24 kg/m  No intake/output data recorded.  FHT:  FHR: 125 bpm, variability: moderate,  accelerations:  Present,  decelerations:  Absent UC:   irregular, every 2-5 minutes  SVE:   Dilation: 4 Effacement (%): 90 Station: 0 Exam by:: Hector Shade, RN  Pitocin @ 4 mu/min  Labs: Lab Results  Component Value Date   WBC 14.1 (H) 10/15/2019   HGB 10.1 (L) 10/15/2019   HCT 31.8 (L) 10/15/2019   MCV 89.1 10/15/2019   PLT 314 10/15/2019    Assessment / Plan: Martinique Craigis a 54 y.J.[id number] at [redacted]w[redacted]d here for early labor and gHTN  #Labor:Progressing on Pitocin (started at 2230). Consider AROM as appropriate. #Pain:Comfortable with epidural #FWB:Cat I #GBS/ID:negative #MOC:post-placental IUD #Circ:Father reports history of possible hypospadias as an infant and needing to wait until he was 53 years old to be circumcised. Discussed that depending on exam may need to defer but if normal anatomy can likely be performed in house.  #gHTN: Pre-E labs normal, no severe range pressures. No new headaches or visual symptoms. Visual symptoms seem more vagal as not persistent at all times.   Merilyn Baba DO OB Fellow, Faculty Practice 10/16/2019, 2:33 AM

## 2019-10-16 NOTE — Progress Notes (Addendum)
Madeline Mccormick is a 23 y.o. G3P0020 at [redacted]w[redacted]d admitted for early labor, gHTN  Subjective: Feeling nauseated with contractions  Objective: BP 123/72   Pulse (!) 105   Temp 98.3 F (36.8 C) (Oral)   Resp 20   Ht 5\' 4"  (1.626 m)   Wt 74.6 kg   LMP 01/08/2019   SpO2 100%   BMI 28.24 kg/m  No intake/output data recorded.  FHT:  FHR: 125 bpm, variability: moderate,  accelerations:  Present,  decelerations:  Recurrent variables decelerations  UC:   regular, every 3 minutes  SVE:   Dilation: 10 Effacement (%): 100 Station: Plus 1 Exam by:: sowder    Labs: Lab Results  Component Value Date   WBC 14.1 (H) 10/15/2019   HGB 10.1 (L) 10/15/2019   HCT 31.8 (L) 10/15/2019   MCV 89.1 10/15/2019   PLT 314 10/15/2019    Assessment / Plan: Madeline Mccormick a 10 y.T.G2B6389 at [redacted]w[redacted]d here for early labor and gHTN  #Labor:Complete and pushing. Will plan on laboring down given recurrent variables.  #Pain:Comfortable with epidural #FWB:Cat II, recurrent variables while pushing #GBS/ID:negative #MOC:post-placental IUD #Circ:Father reports history of possible hypospadias as an infant and needing to wait until he was 50 years old to be circumcised. Discussed that depending on exam may need to defer but if normal anatomy can likely be performed in house.  #gHTN: Pre-E labs normal, no severe range pressures. No new headaches or visual symptoms. Visual symptoms seem more vagal as not persistent at all times.  Maxie Better, MD, PGY1 Labor and Delivery Teaching Service.  10/16/2019, 10:01 AM  Addendum 10:38 AM  -Strip reviewed. Provider to bedside.  120 bpm, Mod Var, +Recurrent Variable/Late Decels, +Accels Ctx Q2-67min, palpates moderate  2nd Stage Cat II FT  -Will continue to push and rest as needed.  -Will remain at bedside. -Anticipate SVD  Maryann Conners MSN, CNM Advanced Practice Provider, Center for [facility]

## 2019-10-16 NOTE — Discharge Summary (Signed)
Postpartum Discharge Summary    Patient Name: Madeline Mccormick DOB: [birth year] MRN: [id number]  Date of admission: 10/15/2019 Delivering Provider: Ulla Gallo B   Date of discharge: 10/17/2019  Admitting diagnosis: HBP, CTX  Intrauterine pregnancy: [redacted]w[redacted]d    Secondary diagnosis:  Active Problems:   Gestational hypertension without significant proteinuria in third trimester   Labor and delivery, indication for care   [redacted] weeks gestation of pregnancy   Anemia of pregnancy  Additional problems: None     Discharge diagnosis: Term Pregnancy Delivered and Gestational Hypertension                                                                                                Post partum procedures:None  Augmentation: AROM and Pitocin  Complications: None  Hospital course:  Onset of Labor With Vaginal Delivery     23y.o. yo G3P0020 at 366w1das admitted in Latent Labor on 10/15/2019. Patient had an uncomplicated labor course as follows: Arrived at 3cm in early labor, augmented with pitocin and AROM and progressed to fully dilated with NSVD. Membrane Rupture Time/Date: 3:53 AM ,10/16/2019   Intrapartum Procedures: Episiotomy: None [1]                                         Lacerations:  Labial [10];Vaginal [6]  Patient had a delivery of a Viable infant. 10/16/2019  Information for the patient's newborn:  Reither, Boy JoMartinique0[[id number]]Delivery Method: Vag-Spont     Pateint had an uncomplicated postpartum course. Blood pressures WNL post-partum. Patient owns BP cuff at home and will monitor daily parameters given. Ferrous sulfate prescribed on discharge for anemia. She is ambulating, tolerating a regular diet, passing flatus, and urinating well. Patient is discharged home in stable condition on 10/17/19.  Delivery time: 11:07 AM    Magnesium Sulfate received: No BMZ received: No Rhophylac:N/A MMR:N/A Transfusion:No  Physical exam  Vitals:   10/16/19 1646 10/16/19 2122 10/17/19  0143 10/17/19 0531  BP: 113/81 124/82 107/63 124/87  Pulse: 95 87 78 74  Resp: '18 18 18 18  '$ Temp: 98.7 F (37.1 C) 98.8 F (37.1 C) 98.1 F (36.7 C) 98.4 F (36.9 C)  TempSrc: Oral Oral Oral Oral  SpO2: 100% 100% 98% 100%  Weight:      Height:       General: alert, cooperative and no distress Lochia: appropriate Uterine Fundus: firm DVT Evaluation: No evidence of DVT seen on physical exam. Labs: Lab Results  Component Value Date   WBC 17.2 (H) 10/17/2019   HGB 9.0 (L) 10/17/2019   HCT 29.2 (L) 10/17/2019   MCV 91.5 10/17/2019   PLT 338 10/17/2019   CMP Latest Ref Rng & Units 10/15/2019  Glucose 70 - 99 mg/dL 86  BUN 6 - 20 mg/dL 6  Creatinine 0.44 - 1.00 mg/dL 0.62  Sodium 135 - 145 mmol/L 136  Potassium 3.5 - 5.1 mmol/L 3.7  Chloride 98 - 111 mmol/L 106  CO2 22 - 32 mmol/L 20(L)  Calcium 8.9 - 10.3 mg/dL 8.6(L)  Total Protein 6.5 - 8.1 g/dL 6.1(L)  Total Bilirubin 0.3 - 1.2 mg/dL 0.4  Alkaline Phos 38 - 126 U/L 163(H)  AST 15 - 41 U/L 14(L)  ALT 0 - 44 U/L 6    Discharge instruction: per After Visit Summary and "Baby and Me Booklet".  After visit meds:  Allergies as of 10/17/2019   No Known Allergies     Medication List    TAKE these medications   benzocaine-Menthol 20-0.5 % Aero Commonly known as: DERMOPLAST Apply 1 application topically as needed for irritation (perineal discomfort).   escitalopram 10 MG tablet Commonly known as: LEXAPRO Take 20 mg by mouth daily.   ferrous sulfate 325 (65 FE) MG tablet Take 1 tablet (325 mg total) by mouth daily with breakfast.   multivitamin-prenatal 27-0.8 MG Tabs tablet Take 1 tablet by mouth daily at 12 noon.       Diet: routine diet  Activity: Advance as tolerated. Pelvic rest for 6 weeks.   Outpatient follow up:6 weeks Follow up Appt: Future Appointments  Date Time Provider [place]  10/24/2019 10:20 AM [place]  11/18/2019 10:35 AM Darlina Rumpf, CNM [facility]  [facility]   Follow up Visit:   Please schedule this patient for Postpartum visit in: 6 weeks with the following provider: Any provider For C/S patients schedule nurse incision check in weeks 2 weeks: no High risk pregnancy complicated by: gHTN Delivery mode:  SVD Anticipated Birth Control:  IUD PP Procedures needed: BP check in 7-10 days, hormonal IUD insertion at 6 weeks  Schedule Integrated BH visit: no    Newborn Data: Live born female  Birth Weight:  3125g APGAR: 6, 7  Newborn Delivery   Birth date/time: 10/16/2019 11:07:00 Delivery type: Vaginal, Spontaneous      Baby Feeding: Breast and bottle Disposition:home with mother   10/17/2019 Chauncey Mann, MD

## 2019-10-16 NOTE — Lactation Note (Signed)
This note was copied from a baby's chart. Lactation Consultation Note  Patient Name: Madeline Mccormick Date: 10/16/2019 Reason for consult: Initial assessment;Term;Primapara;1st time breastfeeding  P1 mother whose infant is now 12 hours old.  Baby was asleep in mother's arms when I arrived.  Mother stated that baby has latched a couple of times since delivery.  Encouraged to feed 8-12 times/24 hours or sooner if baby shows feeding cues.  Reviewed cues.  Mother has been taught hand expression and has been able to express colostrum drops.  Container left at bedside and milk storage times reviewed.  Finger feeding demonstrated.  Mother will call for latch assistance as needed.  She is very receptive to learning.  Basic breast feeding concepts discussed.  Mom made aware of O/P services, breastfeeding support groups, community resources, and our phone # for post-discharge questions.  Mother plans to return to school in January and has a DEBP for home use.  FOB will be a "stay at home" dad.     Maternal Data Formula Feeding for Exclusion: No Has patient been taught Hand Expression?: Yes Does the patient have breastfeeding experience prior to this delivery?: No  Feeding Feeding Type: Breast Fed  LATCH Score Latch: Too sleepy or reluctant, no latch achieved, no sucking elicited.  Audible Swallowing: None  Type of Nipple: Everted at rest and after stimulation  Comfort (Breast/Nipple): Soft / non-tender  Hold (Positioning): Assistance needed to correctly position infant at breast and maintain latch.  LATCH Score: 5  Interventions Interventions: Breast feeding basics reviewed;Assisted with latch;Skin to skin;Breast massage;Hand express;Support pillows;Adjust position;Position options  Lactation Tools Discussed/Used     Consult Status Consult Status: Follow-up Date: 10/17/19 Follow-up type: In-patient    Jayani Rozman R July [place] 10/16/2019, 6:04 PM

## 2019-10-17 DIAGNOSIS — O99019 Anemia complicating pregnancy, unspecified trimester: Secondary | ICD-10-CM

## 2019-10-17 DIAGNOSIS — Z3A39 39 weeks gestation of pregnancy: Secondary | ICD-10-CM

## 2019-10-17 LAB — CBC
HCT: 29.2 % — ABNORMAL LOW (ref 36.0–46.0)
Hemoglobin: 9 g/dL — ABNORMAL LOW (ref 12.0–15.0)
MCH: 28.2 pg (ref 26.0–34.0)
MCHC: 30.8 g/dL (ref 30.0–36.0)
MCV: 91.5 fL (ref 80.0–100.0)
Platelets: 338 10*3/uL (ref 150–400)
RBC: 3.19 MIL/uL — ABNORMAL LOW (ref 3.87–5.11)
RDW: 13.2 % (ref 11.5–15.5)
WBC: 17.2 10*3/uL — ABNORMAL HIGH (ref 4.0–10.5)
nRBC: 0 % (ref 0.0–0.2)

## 2019-10-17 MED ORDER — BENZOCAINE-MENTHOL 20-0.5 % EX AERO: 1.0000 "application " | INHALATION_SPRAY | CUTANEOUS | 0 refills | Status: DC | PRN

## 2019-10-17 MED ORDER — FERROUS SULFATE 325 (65 FE) MG PO TABS: 325.0000 mg | ORAL_TABLET | Freq: Every day | ORAL | 3 refills | Status: DC

## 2019-10-17 NOTE — Lactation Note (Signed)
This note was copied from a baby's chart. Lactation Consultation Note Baby 9 hrs old.  Cueing, tongue thrusting. Assisted in football position. Baby kept tongue thrusting. Biting, holding nipple in mouth. Bottom jaw quivering a lot at intervals. Mom holding baby STS. Finally got baby to latch, then adjusted flange. Mom states biting. Baby latched several times didn't hurt bad, then most of time hurts. Explained to mom don't let the baby BF and hurt. Unlatch and re-latch.  Baby has a long tongue that sticks far out of his mouth when tongue thrusting. Baby has uncoordinated suck, bites a lot.  W/gloved finger suck training performed.  Mom has everted nipples. Lt. Nipple tender. Looks a little red in the center. RN gave coconut oil. Encouraged to hand express let colostrum dry to nipples before applying coconut oil to nipples. Newborn behavior, STS, I&O reviewed. Discussed positioning and support while feeding. Encouraged to call for assistance or questions.  Patient Name: Madeline Mccormick QIWLN'L Date: 10/17/2019 Reason for consult: Mother's request;Difficult latch;Term;Primapara   Maternal Data    Feeding Feeding Type: Breast Fed  LATCH Score Latch: Repeated attempts needed to sustain latch, nipple held in mouth throughout feeding, stimulation needed to elicit sucking reflex.  Audible Swallowing: A few with stimulation  Type of Nipple: Everted at rest and after stimulation  Comfort (Breast/Nipple): Filling, red/small blisters or bruises, mild/mod discomfort(tender)  Hold (Positioning): Full assist, staff holds infant at breast  LATCH Score: 5  Interventions Interventions: Breast feeding basics reviewed;Adjust position;Assisted with latch;Support pillows;Position options;Skin to skin;Breast massage;Expressed milk;Hand express;Pre-pump if needed;Coconut oil;Breast compression;Hand pump  Lactation Tools Discussed/Used Tools: Pump Breast pump type: Manual Pump Review: Setup,  frequency, and cleaning;Milk Storage Initiated by:: Allayne Stack RN IBCLC Date initiated:: 10/17/19   Consult Status Consult Status: Follow-up Date: 10/17/19 Follow-up type: In-patient    Kotaro Buer, [place] 10/17/2019, 2:36 AM

## 2019-10-17 NOTE — Anesthesia Postprocedure Evaluation (Signed)
Anesthesia Post Note  Patient: Madeline Mccormick  Procedure(s) Performed: AN AD [place]     Patient location during evaluation: Mother Baby Anesthesia Type: Epidural Level of consciousness: awake and alert Pain management: pain level controlled Vital Signs Assessment: post-procedure vital signs reviewed and stable Respiratory status: spontaneous breathing, nonlabored ventilation and respiratory function stable Cardiovascular status: stable Postop Assessment: no headache, no backache and epidural receding Anesthetic complications: no Comments: Spoke to pt on the phone. No anesthetic complications noted.    Last Vitals:  Vitals:   10/17/19 0143 10/17/19 0531  BP: 107/63 124/87  Pulse: 78 74  Resp: 18 18  Temp: 36.7 C 36.9 C  SpO2: 98% 100%    Last Pain:  Vitals:   10/17/19 0531  TempSrc: Oral  PainSc: 3    Pain Goal:                   Riki Sheer

## 2019-10-21 ENCOUNTER — Other Ambulatory Visit: Payer: Self-pay

## 2019-10-21 ENCOUNTER — Inpatient Hospital Stay (HOSPITAL_COMMUNITY)
Admission: AD | Admit: 2019-10-21 | Discharge: 2019-10-23 | DRG: 776 | Disposition: A | Discharge status: Home / Self Care | Payer: BC Managed Care – PPO | Attending: Obstetrics & Gynecology | Admitting: Obstetrics & Gynecology

## 2019-10-21 ENCOUNTER — Other Ambulatory Visit: Payer: BC Managed Care – PPO

## 2019-10-21 ENCOUNTER — Encounter (HOSPITAL_COMMUNITY): Payer: Self-pay

## 2019-10-21 DIAGNOSIS — O1495 Unspecified pre-eclampsia, complicating the puerperium: Secondary | ICD-10-CM | POA: Diagnosis not present

## 2019-10-21 DIAGNOSIS — O133 Gestational [pregnancy-induced] hypertension without significant proteinuria, third trimester: Secondary | ICD-10-CM

## 2019-10-21 DIAGNOSIS — O1415 Severe pre-eclampsia, complicating the puerperium: Principal | ICD-10-CM | POA: Diagnosis present

## 2019-10-21 DIAGNOSIS — Z87891 Personal history of nicotine dependence: Secondary | ICD-10-CM

## 2019-10-21 DIAGNOSIS — Z8759 Personal history of other complications of pregnancy, childbirth and the puerperium: Secondary | ICD-10-CM

## 2019-10-21 DIAGNOSIS — R030 Elevated blood-pressure reading, without diagnosis of hypertension: Secondary | ICD-10-CM | POA: Diagnosis not present

## 2019-10-21 HISTORY — DX: Personal history of other complications of pregnancy, childbirth and the puerperium: Z87.59

## 2019-10-21 LAB — COMPREHENSIVE METABOLIC PANEL
ALT: 8 U/L (ref 0–44)
AST: 14 U/L — ABNORMAL LOW (ref 15–41)
Albumin: 2.9 g/dL — ABNORMAL LOW (ref 3.5–5.0)
Alkaline Phosphatase: 98 U/L (ref 38–126)
Anion gap: 8 (ref 5–15)
BUN: 9 mg/dL (ref 6–20)
CO2: 23 mmol/L (ref 22–32)
Calcium: 8.5 mg/dL — ABNORMAL LOW (ref 8.9–10.3)
Chloride: 108 mmol/L (ref 98–111)
Creatinine, Ser: 0.58 mg/dL (ref 0.44–1.00)
GFR calc Af Amer: >60 mL/min (ref >60)
GFR calc non Af Amer: >60 mL/min (ref >60)
Glucose, Bld: 77 mg/dL (ref 70–99)
Potassium: 3.7 mmol/L (ref 3.5–5.1)
Sodium: 139 mmol/L (ref 135–145)
Total Bilirubin: 0.4 mg/dL (ref 0.3–1.2)
Total Protein: 5.8 g/dL — ABNORMAL LOW (ref 6.5–8.1)

## 2019-10-21 LAB — CBC
HCT: 29.3 % — ABNORMAL LOW (ref 36.0–46.0)
Hemoglobin: 9.1 g/dL — ABNORMAL LOW (ref 12.0–15.0)
MCH: 28.2 pg (ref 26.0–34.0)
MCHC: 31.1 g/dL (ref 30.0–36.0)
MCV: 90.7 fL (ref 80.0–100.0)
Platelets: 376 10*3/uL (ref 150–400)
RBC: 3.23 MIL/uL — ABNORMAL LOW (ref 3.87–5.11)
RDW: 13.2 % (ref 11.5–15.5)
WBC: 8.2 10*3/uL (ref 4.0–10.5)
nRBC: 0.4 % — ABNORMAL HIGH (ref 0.0–0.2)

## 2019-10-21 MED ORDER — MAGNESIUM CITRATE PO SOLN: 1.0000 | Freq: Once | ORAL | Status: DC | PRN

## 2019-10-21 MED ORDER — ONDANSETRON HCL 4 MG/2ML IJ SOLN: 4.0000 mg | Freq: Four times a day (QID) | INTRAMUSCULAR | Status: DC | PRN

## 2019-10-21 MED ORDER — OXYCODONE-ACETAMINOPHEN 5-325 MG PO TABS: 1.0000 | ORAL_TABLET | ORAL | Status: DC | PRN

## 2019-10-21 MED ORDER — LACTATED RINGERS IV SOLN
INTRAVENOUS | Status: DC
  Administered 2019-10-21 – 2019-10-22 (×2): via INTRAVENOUS

## 2019-10-21 MED ORDER — HYDRALAZINE HCL 20 MG/ML IJ SOLN: 10.0000 mg | INTRAMUSCULAR | Status: DC | PRN

## 2019-10-21 MED ORDER — HYDROMORPHONE HCL 1 MG/ML IJ SOLN: 0.2000 mg | INTRAMUSCULAR | Status: DC | PRN

## 2019-10-21 MED ORDER — LABETALOL HCL 5 MG/ML IV SOLN
20.0000 mg | INTRAVENOUS | Status: DC | PRN
  Administered 2019-10-21: 22:00:00 20 mg via INTRAVENOUS
  Filled 2019-10-21: qty 4

## 2019-10-21 MED ORDER — MAGNESIUM SULFATE 40 GM/1000ML IV SOLN
2.0000 g/h | INTRAVENOUS | Status: AC
  Administered 2019-10-21 – 2019-10-22 (×2): 2 g/h via INTRAVENOUS
  Filled 2019-10-21 (×2): qty 1000

## 2019-10-21 MED ORDER — BISACODYL 5 MG PO TBEC: 5.0000 mg | DELAYED_RELEASE_TABLET | Freq: Every day | ORAL | Status: DC | PRN

## 2019-10-21 MED ORDER — IBUPROFEN 600 MG PO TABS
600.0000 mg | ORAL_TABLET | Freq: Four times a day (QID) | ORAL | Status: DC | PRN
  Administered 2019-10-21 – 2019-10-22 (×2): 600 mg via ORAL
  Filled 2019-10-21 (×3): qty 1

## 2019-10-21 MED ORDER — ALUM & MAG HYDROXIDE-SIMETH 200-200-20 MG/5ML PO SUSP: 30.0000 mL | ORAL | Status: DC | PRN

## 2019-10-21 MED ORDER — LABETALOL HCL 5 MG/ML IV SOLN: 40.0000 mg | INTRAVENOUS | Status: DC | PRN

## 2019-10-21 MED ORDER — DOCUSATE SODIUM 100 MG PO CAPS
100.0000 mg | ORAL_CAPSULE | Freq: Two times a day (BID) | ORAL | Status: DC
  Administered 2019-10-21 – 2019-10-23 (×4): 100 mg via ORAL
  Filled 2019-10-21 (×4): qty 1

## 2019-10-21 MED ORDER — MAGNESIUM HYDROXIDE 400 MG/5ML PO SUSP: 30.0000 mL | Freq: Every day | ORAL | Status: DC | PRN

## 2019-10-21 MED ORDER — ONDANSETRON HCL 4 MG PO TABS: 4.0000 mg | ORAL_TABLET | Freq: Four times a day (QID) | ORAL | Status: DC | PRN

## 2019-10-21 MED ORDER — ESCITALOPRAM OXALATE 10 MG PO TABS
20.0000 mg | ORAL_TABLET | Freq: Every day | ORAL | Status: DC
  Administered 2019-10-21 – 2019-10-22 (×2): 20 mg via ORAL
  Filled 2019-10-21 (×2): qty 2

## 2019-10-21 MED ORDER — HYDRALAZINE HCL 20 MG/ML IJ SOLN: 5.0000 mg | INTRAMUSCULAR | Status: DC | PRN

## 2019-10-21 MED ORDER — MAGNESIUM SULFATE BOLUS VIA INFUSION
4.0000 g | Freq: Once | INTRAVENOUS | Status: AC
  Administered 2019-10-21: 22:00:00 4 g via INTRAVENOUS
  Filled 2019-10-21: qty 1000

## 2019-10-21 MED ORDER — ZOLPIDEM TARTRATE 5 MG PO TABS: 5.0000 mg | ORAL_TABLET | Freq: Every evening | ORAL | Status: DC | PRN

## 2019-10-21 NOTE — Progress Notes (Signed)
Provider aware of no IV access and IV team consulted, no new orders received

## 2019-10-21 NOTE — MAU Note (Signed)
IV team is in process of starting IV

## 2019-10-21 NOTE — MAU Note (Signed)
PT SAYS SHE DEL VAG ON 10-28.  WENT HOME ON 10-29- (PT CAME IN ON 28TH  FOR H/A  BP WAS ELEVATED SO KEPT FOR DEL )        SAYS LAST NIGHT - HAD H/A   BP 175/90- CHECKED AT HOME .    IS NOT TAKING ANY BP MEDS.  BP WAS GOOD ON D/C . AFTER RESTING -150/80.  TODAY  WAS 160/90-  WITH H/A.     HAS NOT TAKEN ANY MEDS    CALLED CLINIC- TOLD TO COME IN

## 2019-10-21 NOTE — H&P (Signed)
Madeline Mccormick is a 23 y.o. female presenting for elevated BP. Patient was induced @[redacted]w[redacted]d  for HA and gHTN and is s/p SVD with 1st degree laceration. Pt was not given magnesium in hospital and was discharged home without BP medication and with normal BPs. Pt reports HA that started last night and rates as 6/10. Pt has not taken medication today for HA. Pt also reports intermittent blurry vision and pain in upper back that began on her way to MAU. Pt denies N/V, abdominal pain, chest pain/SOB, swelling in hands or face or other symptoms.  OB History    Gravida  3   Para  1   Term  1   Preterm      AB  2   Living  1     SAB  1   TAB  1   Ectopic      Multiple  0   Live Births  1          Past Medical History:  Diagnosis Date  . ADHD    previously took Vivant - stopped 01/2019  . Allergy   . Anxiety   . Depression    Past Surgical History:  Procedure Laterality Date  . DENTAL SURGERY    . THERAPEUTIC ABORTION     Family History: family history includes Arthritis in her maternal grandmother; Depression in her mother; Diabetes in her mother; Stroke in her maternal grandmother; Sudden death in her maternal grandmother; Thyroid disease in her mother. Social History:  reports that she quit smoking about 8 months ago. Her smoking use included cigarettes. She smoked 0.50 packs per day. She has never used smokeless tobacco. She reports that she does not drink alcohol or use drugs.     Maternal Diabetes: No Genetic Screening: Normal Maternal Ultrasounds/Referrals: Normal Fetal Ultrasounds or other Referrals:  None Maternal Substance Abuse:  No Significant Maternal Medications:  Meds include: Other: Lexapro Significant Maternal Lab Results:  Other: H/H 9/29.2 on 10/17/2019 Other Comments:  None  Review of Systems  Constitutional: Negative for chills and fever.  Eyes: Positive for blurred vision.  Respiratory: Negative for shortness of breath.   Cardiovascular: Negative for  chest pain.  Gastrointestinal: Negative for abdominal pain, constipation, diarrhea, nausea and vomiting.  Musculoskeletal: Positive for back pain (upper back on way to MAU).  Neurological: Positive for headaches. Negative for dizziness and weakness.   Maternal Medical History:  Reason for admission: Nausea.      Blood pressure (!) 155/78, temperature 98.6 F (37 C), resp. rate 20, height 5\' 4"  (1.626 m), weight 69.7 kg, last menstrual period 01/08/2019, unknown if currently breastfeeding.   Patient Vitals for the past 24 hrs:  BP Temp Resp Height Weight  10/21/19 2002 (!) 155/78 98.6 F (37 C) 20 5\' 4"  (1.626 m) 69.7 kg   Exam Physical Exam  Constitutional: She is oriented to person, place, and time. She appears well-developed and well-nourished. No distress.  HENT:  Head: Normocephalic and atraumatic.  Respiratory: Effort normal.  Neurological: She is alert and oriented to person, place, and time.  Skin: She is not diaphoretic.  Psychiatric: She has a normal mood and affect. Her behavior is normal. Judgment and thought content normal.    Prenatal labs: ABO, Rh: --/--/A POS (10/27 1515) Antibody: NEG (10/27 1515) Rubella: 3.18 (05/01 0932) RPR: NON REACTIVE (10/27 1515)  HBsAg: Negative (05/01 0932)  HIV: Non Reactive (08/12 0920)  GBS: Negative/-- (10/13 1529)   Assessment/Plan:  -admit to [facility] for postpartum preeclampsia and magnesium  Odie Sera Nugent 10/21/2019, 8:39 PM

## 2019-10-22 LAB — COMPREHENSIVE METABOLIC PANEL
ALT: 7 U/L (ref 0–44)
AST: 14 U/L — ABNORMAL LOW (ref 15–41)
Albumin: 2.5 g/dL — ABNORMAL LOW (ref 3.5–5.0)
Alkaline Phosphatase: 102 U/L (ref 38–126)
Anion gap: 13 (ref 5–15)
BUN: 8 mg/dL (ref 6–20)
CO2: 23 mmol/L (ref 22–32)
Calcium: 7.3 mg/dL — ABNORMAL LOW (ref 8.9–10.3)
Chloride: 103 mmol/L (ref 98–111)
Creatinine, Ser: 0.57 mg/dL (ref 0.44–1.00)
GFR calc Af Amer: >60 mL/min (ref >60)
GFR calc non Af Amer: >60 mL/min (ref >60)
Glucose, Bld: 101 mg/dL — ABNORMAL HIGH (ref 70–99)
Potassium: 3.4 mmol/L — ABNORMAL LOW (ref 3.5–5.1)
Sodium: 139 mmol/L (ref 135–145)
Total Bilirubin: 0.1 mg/dL — ABNORMAL LOW (ref 0.3–1.2)
Total Protein: 5.3 g/dL — ABNORMAL LOW (ref 6.5–8.1)

## 2019-10-22 LAB — CBC WITH DIFFERENTIAL/PLATELET
Abs Immature Granulocytes: 0.14 10*3/uL — ABNORMAL HIGH (ref 0.00–0.07)
Basophils Absolute: 0 10*3/uL (ref 0.0–0.1)
Basophils Relative: 0 %
Eosinophils Absolute: 0.3 10*3/uL (ref 0.0–0.5)
Eosinophils Relative: 3 %
HCT: 28.1 % — ABNORMAL LOW (ref 36.0–46.0)
Hemoglobin: 8.6 g/dL — ABNORMAL LOW (ref 12.0–15.0)
Immature Granulocytes: 2 %
Lymphocytes Relative: 23 %
Lymphs Abs: 2 10*3/uL (ref 0.7–4.0)
MCH: 27.9 pg (ref 26.0–34.0)
MCHC: 30.6 g/dL (ref 30.0–36.0)
MCV: 91.2 fL (ref 80.0–100.0)
Monocytes Absolute: 0.9 10*3/uL (ref 0.1–1.0)
Monocytes Relative: 10 %
Neutro Abs: 5.4 10*3/uL (ref 1.7–7.7)
Neutrophils Relative %: 62 %
Platelets: 357 10*3/uL (ref 150–400)
RBC: 3.08 MIL/uL — ABNORMAL LOW (ref 3.87–5.11)
RDW: 13.2 % (ref 11.5–15.5)
WBC: 8.7 10*3/uL (ref 4.0–10.5)
nRBC: 0 % (ref 0.0–0.2)

## 2019-10-22 LAB — MAGNESIUM: Magnesium: 5.3 mg/dL — ABNORMAL HIGH (ref 1.7–2.4)

## 2019-10-22 MED ORDER — POTASSIUM CHLORIDE CRYS ER 20 MEQ PO TBCR
40.0000 meq | EXTENDED_RELEASE_TABLET | Freq: Every day | ORAL | Status: DC
  Administered 2019-10-22 – 2019-10-23 (×2): 40 meq via ORAL
  Filled 2019-10-22 (×2): qty 2

## 2019-10-22 MED ORDER — ENALAPRIL MALEATE 5 MG PO TABS
5.0000 mg | ORAL_TABLET | Freq: Every day | ORAL | Status: DC
  Administered 2019-10-22 – 2019-10-23 (×2): 5 mg via ORAL
  Filled 2019-10-22 (×2): qty 1

## 2019-10-22 NOTE — Lactation Note (Signed)
Lactation Consultation Note  Patient Name: Madeline Mccormick Date: 10/22/2019  Post partum readmit for treatment of pre eclampsia.  Mom was pumping at home and obtaining 50 mls.  She has not pumped since yesterday morning.  Breasts are full but soft and not engorged.  Mom would like to continue pumping breasts.  Symphony pump set up at bedside.  Instructed on use and cleaning.  Mom will store milk in refrigerator.  Instructed to pump every 2-3 hours for 15-20 minutes.  Encouraged to call for questions or concerns.   Maternal Data    Feeding    LATCH Score                   Interventions    Lactation Tools Discussed/Used     Consult Status      Ave Filter 10/22/2019, 8:28 AM

## 2019-10-22 NOTE — Progress Notes (Signed)
[facility] OB[facility]N Attending Note  Subjective:  Patient reports decreased headaches.  No visual symptoms, RUQ/epigastric pain or other concerning symptoms.  Admitted on 10/21/2019 for Preeclampsia in postpartum period.    Objective:  Blood pressure 110/63, pulse 70, temperature 97.8 F (36.6 C), temperature source Oral, resp. rate 18, height 5\' 4"  (1.626 m), weight 69.7 kg, last menstrual period 01/08/2019, SpO2 98 %, unknown if currently breastfeeding.  Patient Vitals for the past 24 hrs:  BP Temp Temp src Pulse Resp SpO2 Height Weight  10/22/19 0700 - - - - 18 - - -  10/22/19 0600 - - - - 18 - - -  10/22/19 0500 110/63 - - 70 18 - - -  10/22/19 0401 113/69 97.8 F (36.6 C) Oral 70 18 98 % - -  10/22/19 0301 105/62 - - 73 18 - - -  10/22/19 0201 108/62 - - 84 18 - - -  10/22/19 0100 112/60 - - 80 18 - - -  10/22/19 0001 139/72 - - 73 18 98 % - -  10/21/19 2301 137/75 - - 70 18 99 % - -  10/21/19 2240 (!) 147/82 98 F (36.7 C) Oral 78 18 99 % - -  10/21/19 2215 (!) 143/86 97.8 F (36.6 C) Oral 85 16 98 % - -  10/21/19 2200 (!) 141/84 98.3 F (36.8 C) Oral 86 16 100 % - -  10/21/19 2145 (!) 149/87 98.1 F (36.7 C) Oral 69 16 100 % - -  10/21/19 2116 (!) 163/90 - - 76 - - - -  10/21/19 2100 (!) 154/86 - - 64 - 100 % - -  10/21/19 2044 (!) 165/89 - - 65 - - - -  10/21/19 2030 (!) 166/94 - - 78 - - - -  10/21/19 2024 (!) 154/94 - - 73 - - - -  10/21/19 2021 (!) 157/91 - - 79 - - - -  10/21/19 2002 (!) 155/78 98.6 F (37 C) - - 20 - 5\' 4"  (1.626 m) 69.7 kg   Gen: NAD HENT: Normocephalic, atraumatic Lungs: Normal respiratory effort Heart: Regular rate noted Abdomen: NT, soft Cervix: Deferred Ext: 2+ DTRs, no edema, no cyanosis, negative Homan's sign  Assessment & Plan:  23 y.o. [id number] PPD#6 re-admitted for postpartum severe preeclampsia (HA and BP)  - Continue magnesium sulfate for 24 hours - Will start on Enalapril 5 mg today for HTN maintenance - Routine postpartum  care.  Verita Schneiders, MD, [place] for [employer], [place]

## 2019-10-23 MED ORDER — ENALAPRIL MALEATE 5 MG PO TABS: 5.0000 mg | ORAL_TABLET | Freq: Every day | ORAL | 1 refills | Status: DC

## 2019-10-23 NOTE — Discharge Summary (Signed)
Physician Discharge Summary  Patient ID: Madeline Gratz MRN: [id number] DOB/AGE: 1996-03-26 23 y.o.  Admit date: 10/21/2019 Discharge date: 10/23/2019  Admission Diagnoses:Principal Problem:   Preeclampsia in postpartum period  Discharge Diagnoses:  Principal Problem:   Preeclampsia in postpartum period   Discharged Condition: good  Hospital Course: Madeline Mccormick is a 23 y.o. female presenting for elevated BP. Patient was induced @[redacted]w[redacted]d  for HA and gHTN and is s/p SVD with 1st degree laceration. Pt was not given magnesium in hospital and was discharged home without BP medication and with normal BPs. Pt reports HA that started last night and rates as 6/10. Pt has not taken medication today for HA. Pt also reports intermittent blurry vision and pain in upper back that began on her way to MAU. Pt denies N/V, abdominal pain, chest pain/SOB, swelling in hands or face or other symptoms.          OB History    Gravida  3   Para  1   Term  1   Preterm      AB  2   Living  1     SAB  1   TAB  1   Ectopic      Multiple  0   Live Births  1              Past Medical History:  Diagnosis Date  . ADHD    previously took Vivant - stopped 01/2019  . Allergy   . Anxiety   . Depression         Past Surgical History:  Procedure Laterality Date  . DENTAL SURGERY    . THERAPEUTIC ABORTION     Family History: family history includes Arthritis in her maternal grandmother; Depression in her mother; Diabetes in her mother; Stroke in her maternal grandmother; Sudden death in her maternal grandmother; Thyroid disease in her mother. Social History:  reports that she quit smoking about 8 months ago. Her smoking use included cigarettes. She smoked 0.50 packs per day. She has never used smokeless tobacco. She reports that she does not drink alcohol or use drugs.     Maternal Diabetes: No Genetic Screening: Normal Maternal Ultrasounds/Referrals: Normal Fetal  Ultrasounds or other Referrals:  None Maternal Substance Abuse:  No Significant Maternal Medications:  Meds include: Other: Lexapro Significant Maternal Lab Results:  Other: H/H 9/29.2 on 10/17/2019 Other Comments:  None  Review of Systems  Constitutional: Negative for chills and fever.  Eyes: Positive for blurred vision.  Respiratory: Negative for shortness of breath.   Cardiovascular: Negative for chest pain.  Gastrointestinal: Negative for abdominal pain, constipation, diarrhea, nausea and vomiting.  Musculoskeletal: Positive for back pain (upper back on way to MAU).  Neurological: Positive for headaches. Negative for dizziness and weakness.   Maternal Medical History:  Reason for admission: Nausea.    Blood pressure (!) 155/78, temperature 98.6 F (37 C), resp. rate 20, height 5\' 4"  (1.626 m), weight 69.7 kg, last menstrual period 01/08/2019, unknown if currently breastfeeding.   Patient Vitals for the past 24 hrs:  BP Temp Resp Height Weight  10/21/19 2002 (!) 155/78 98.6 F (37 C) 20 5\' 4"  (1.626 m) 69.7 kg   Exam Physical Exam  Constitutional: She is oriented to person, place, and time. She appears well-developed and well-nourished. No distress.  HENT:  Head: Normocephalic and atraumatic.  Respiratory: Effort normal.  Neurological: She is alert and oriented to person, place, and time.  Skin: She is not diaphoretic.  Psychiatric: She has a normal mood and affect. Her behavior is normal. Judgment and thought content normal.    Prenatal labs: ABO, Rh: --/--/A POS (10/27 1515) Antibody: NEG (10/27 1515) Rubella: 3.18 (05/01 0932) RPR: NON REACTIVE (10/27 1515)  HBsAg: Negative (05/01 0932)  HIV: Non Reactive (08/12 0920)  GBS: Negative/-- (10/13 1529)   Assessment/Plan:  -admit to [facility] for postpartum preeclampsia and magnesium             She was admitted to manage her hypertension and did well with no severe range BP at the time of  discharge and she felt well.  Consults: None  Significant Diagnostic Studies: labs:  CBC    Component Value Date/Time   WBC 8.7 10/22/2019 0502   RBC 3.08 (L) 10/22/2019 0502   HGB 8.6 (L) 10/22/2019 0502   HGB 10.8 (L) 07/31/2019 0920   HCT 28.1 (L) 10/22/2019 0502   HCT 32.8 (L) 07/31/2019 0920   PLT 357 10/22/2019 0502   PLT 302 07/31/2019 0920   MCV 91.2 10/22/2019 0502   MCV 99 (H) 07/31/2019 0920   MCH 27.9 10/22/2019 0502   MCHC 30.6 10/22/2019 0502   RDW 13.2 10/22/2019 0502   RDW 12.4 07/31/2019 0920   LYMPHSABS 2.0 10/22/2019 0502   LYMPHSABS 1.7 04/19/2019 0932   MONOABS 0.9 10/22/2019 0502   EOSABS 0.3 10/22/2019 0502   EOSABS 0.2 04/19/2019 0932   BASOSABS 0.0 10/22/2019 0502   BASOSABS 0.0 04/19/2019 0932   CMP Latest Ref Rng & Units 10/22/2019 10/21/2019 10/15/2019  Glucose 70 - 99 mg/dL 101(H) 77 86  BUN 6 - 20 mg/dL 8 9 6   Creatinine 0.44 - 1.00 mg/dL 0.57 0.58 0.62  Sodium 135 - 145 mmol/L 139 139 136  Potassium 3.5 - 5.1 mmol/L 3.4(L) 3.7 3.7  Chloride 98 - 111 mmol/L 103 108 106  CO2 22 - 32 mmol/L 23 23 20(L)  Calcium 8.9 - 10.3 mg/dL 7.3(L) 8.5(L) 8.6(L)  Total Protein 6.5 - 8.1 g/dL 5.3(L) 5.8(L) 6.1(L)  Total Bilirubin 0.3 - 1.2 mg/dL 0.1(L) 0.4 0.4  Alkaline Phos 38 - 126 U/L 102 98 163(H)  AST 15 - 41 U/L 14(L) 14(L) 14(L)  ALT 0 - 44 U/L 7 8 6      Treatments: IV hydration and  Allergies as of 10/23/2019   No Known Allergies     Medication List    TAKE these medications   benzocaine-Menthol 20-0.5 % Aero Commonly known as: DERMOPLAST Apply 1 application topically as needed for irritation (perineal discomfort).   enalapril 5 MG tablet Commonly known as: VASOTEC Take 1 tablet (5 mg total) by mouth daily. Start taking on: October 24, 2019   escitalopram 10 MG tablet Commonly known as: LEXAPRO Take 20 mg by mouth daily.   ferrous sulfate 325 (65 FE) MG tablet Take 1 tablet (325 mg total) by mouth daily with breakfast.    multivitamin-prenatal 27-0.8 MG Tabs tablet Take 1 tablet by mouth daily at 12 noon.        Discharge Exam: Blood pressure 138/85, pulse 86, temperature 97.6 F (36.4 C), temperature source Oral, resp. rate 18, height 5\' 4"  (1.626 m), weight 69.7 kg, last menstrual period 01/08/2019, SpO2 100 %, unknown if currently breastfeeding. General appearance: alert, cooperative and no distress Resp: normal effort GI: non distended  Disposition: Discharge disposition: 01-Home or Self Care       Discharge Instructions    Discharge patient   Complete by: As directed  Discharge disposition: 01-Home or Self Care   Discharge patient date: 10/23/2019     Allergies as of 10/23/2019   No Known Allergies     Medication List    TAKE these medications   benzocaine-Menthol 20-0.5 % Aero Commonly known as: DERMOPLAST Apply 1 application topically as needed for irritation (perineal discomfort).   enalapril 5 MG tablet Commonly known as: VASOTEC Take 1 tablet (5 mg total) by mouth daily. Start taking on: October 24, 2019   escitalopram 10 MG tablet Commonly known as: LEXAPRO Take 20 mg by mouth daily.   ferrous sulfate 325 (65 FE) MG tablet Take 1 tablet (325 mg total) by mouth daily with breakfast.   multivitamin-prenatal 27-0.8 MG Tabs tablet Take 1 tablet by mouth daily at 12 noon.      Follow-up Information    [facility]   Specialty: Obstetrics and Gynecology Contact information: [address], [address] [id number] [place] [id number] [phone]          Signed: Scheryl DarterJames Ivannah Zody 10/23/2019, 9:18 AM

## 2019-10-23 NOTE — Discharge Instructions (Signed)
Hypertension During Pregnancy °High blood pressure (hypertension) is when the force of blood pumping through the arteries is too strong. Arteries are blood vessels that carry blood from the heart throughout the body. Hypertension during pregnancy can be mild or severe. Severe hypertension during pregnancy (preeclampsia) is a medical emergency that requires prompt evaluation and treatment. °Different types of hypertension can happen during pregnancy. These include: °· Chronic hypertension. This happens when you had high blood pressure before you became pregnant, and it continues during the pregnancy. Hypertension that develops before you are [redacted] weeks pregnant and continues during the pregnancy is also called chronic hypertension. If you have chronic hypertension, it will not go away after you have your baby. You will need follow-up visits with your health care provider after you have your baby. Your doctor may want you to keep taking medicine for your blood pressure. °· Gestational hypertension. This is hypertension that develops after the 20th week of pregnancy. Gestational hypertension usually goes away after you have your baby, but your health care provider will need to monitor your blood pressure to make sure that it is getting better. °· Preeclampsia. This is severe hypertension during pregnancy. This can cause serious complications for you and your baby and can also cause complications for you after the delivery of your baby. °· Postpartum preeclampsia. You may develop severe hypertension after giving birth. This usually occurs within 48 hours after childbirth but may occur up to 6 weeks after giving birth. This is rare. °How does this affect me? °Women who have hypertension during pregnancy have a greater chance of developing hypertension later in life or during future pregnancies. In some cases, hypertension during pregnancy can cause serious complications, such as: °· Stroke. °· Heart attack. °· Injury to  other organs, such as kidneys, lungs, or liver. °· Preeclampsia. °· Convulsions or seizures. °· Placental abruption. °How does this affect my baby? °Hypertension during pregnancy can affect your baby. Your baby may: °· Be born early (prematurely). °· Not weigh as much as he or she should at birth (low birth weight). °· Not tolerate labor well, leading to an unplanned cesarean delivery. °What are the risks? °There are certain factors that make it more likely for you to develop hypertension during pregnancy. These include: °· Having hypertension during a previous pregnancy. °· Being overweight. °· Being age 35 or older. °· Being pregnant for the first time. °· Being pregnant with more than one baby. °· Becoming pregnant using fertilization methods, such as IVF (in vitro fertilization). °· Having other medical problems, such as diabetes, kidney disease, or lupus. °· Having a family history of hypertension. °What can I do to lower my risk? °The exact cause of hypertension during pregnancy is not known. You may be able to lower your risk by: °· Maintaining a healthy weight. °· Eating a healthy and balanced diet. °· Following your health care provider's instructions about treating any long-term conditions that you had before becoming pregnant. °It is very important to keep all of your prenatal care appointments. Your health care provider will check your blood pressure and make sure that your pregnancy is progressing as expected. If a problem is found, early treatment can prevent complications. °How is this treated? °Treatment for hypertension during pregnancy varies depending on the type of hypertension you have and how serious it is. °· If you were taking medicine for high blood pressure before you became pregnant, talk with your health care provider. You may need to change medicine during pregnancy because   some medicines, like ACE inhibitors, may not be considered safe for your baby. °· If you have gestational  hypertension, your health care provider may order medicine to treat this during pregnancy. °· If you are at risk for preeclampsia, your health care provider may recommend that you take a low-dose aspirin during your pregnancy. °· If you have severe hypertension, you may need to be hospitalized so you and your baby can be monitored closely. You may also need to be given medicine to lower your blood pressure. This medicine may be given by mouth or through an IV. °· In some cases, if your condition gets worse, you may need to deliver your baby early. °Follow these instructions at home: °Eating and drinking ° °· Drink enough fluid to keep your urine pale yellow. °· Avoid caffeine. °Lifestyle °· Do not use any products that contain nicotine or tobacco, such as cigarettes, e-cigarettes, and chewing tobacco. If you need help quitting, ask your health care provider. °· Do not use alcohol or drugs. °· Avoid stress as much as possible. °· Rest and get plenty of sleep. °· Regular exercise can help to reduce your blood pressure. Ask your health care provider what kinds of exercise are best for you. °General instructions °· Take over-the-counter and prescription medicines only as told by your health care provider. °· Keep all prenatal and follow-up visits as told by your health care provider. This is important. °Contact a health care provider if: °· You have symptoms that your health care provider told you may require more treatment or monitoring, such as: °? Headaches. °? Nausea or vomiting. °? Abdominal pain. °? Dizziness. °? Light-headedness. °Get help right away if: °· You have: °? Severe abdominal pain that does not get better with treatment. °? A severe headache that does not get better. °? Vomiting that does not get better. °? Sudden, rapid weight gain. °? Sudden swelling in your hands, ankles, or face. °? Vaginal bleeding. °? Blood in your urine. °? Blurred or double vision. °? Shortness of breath or chest  pain. °? Weakness on one side of your body. °? Difficulty speaking. °· Your baby is not moving as much as usual. °Summary °· High blood pressure (hypertension) is when the force of blood pumping through the arteries is too strong. °· Hypertension during pregnancy can cause problems for you and your baby. °· Treatment for hypertension during pregnancy varies depending on the type of hypertension you have and how serious it is. °· Keep all prenatal and follow-up visits as told by your health care provider. This is important. °This information is not intended to replace advice given to you by your health care provider. Make sure you discuss any questions you have with your health care provider. °Document Released: 08/23/2011 Document Revised: 03/28/2019 Document Reviewed: 01/01/2019 °Elsevier Patient Education © 2020 [employer] ° °

## 2019-10-24 ENCOUNTER — Other Ambulatory Visit: Payer: Self-pay

## 2019-10-24 ENCOUNTER — Ambulatory Visit (INDEPENDENT_AMBULATORY_CARE_PROVIDER_SITE_OTHER): Payer: Medicaid Other | Admitting: Emergency Medicine

## 2019-10-24 VITALS — BP 151/102 | HR 70 | Temp 98.2°F | Wt 148.8 lb

## 2019-10-24 DIAGNOSIS — Z0130 Encounter for examination of blood pressure without abnormal findings: Secondary | ICD-10-CM

## 2019-10-24 MED ORDER — ENALAPRIL MALEATE 10 MG PO TABS: 10.0000 mg | ORAL_TABLET | Freq: Every day | ORAL | 1 refills | Status: DC

## 2019-10-24 NOTE — Progress Notes (Signed)
I have reviewed this chart and agree with the RN/CMA assessment and management.    K. Meryl Davis, M.D. Attending [facility])   

## 2019-10-24 NOTE — Progress Notes (Signed)
Pt here today for a blood pressure check. First blood pressure reading 147/105 and second blood pressure reading 151/102. Pt reports blurred vision when leaving home that has since subsided. Pt denies headaches. Pt currently taking 5 mg Vasotec once daily and reports taking last dose prior to appointment.   Consulted with Dr. Rosana Hoes and orders were placed for 10 mg Vasotec daily and pt to have repeat blood pressure check on 11/10. Pt instructed to take 2 tablets of her current bottle of 5 mg Vasotec prescription to total 10 mg and pick up 10 mg Vasotec prescription once that bottle is finished. Pt verbalized understanding and had no further questions.   Loma Sousa, [place] 10/24/2019   1115

## 2019-10-29 ENCOUNTER — Ambulatory Visit (INDEPENDENT_AMBULATORY_CARE_PROVIDER_SITE_OTHER): Payer: Medicaid Other | Admitting: General Practice

## 2019-10-29 ENCOUNTER — Other Ambulatory Visit: Payer: Self-pay

## 2019-10-29 VITALS — BP 126/89 | HR 69 | Ht 64.0 in | Wt 143.0 lb

## 2019-10-29 DIAGNOSIS — Z0130 Encounter for examination of blood pressure without abnormal findings: Secondary | ICD-10-CM

## 2019-10-29 NOTE — Progress Notes (Signed)
Patient seen and assessed by nursing staff during this encounter. I have reviewed the chart and agree with the documentation and plan.  Kerry Hough, PA-C 10/29/2019 11:34 AM

## 2019-10-29 NOTE — Progress Notes (Signed)
Patient presents to office today for blood pressure check following an elevated pressure on 11/5 visit. Patient's dose of Vasotec was increased from 5mg  to 10mg . Patient reports headaches have gone away last visit and denies dizziness or blurry vision. No edema noted today. Patient states she is taking her medication daily and took it this morning around 9am.   Blood pressure 126/89 today. Reviewed with Kerry Hough who states patient should continue medication and follow up at pp visit as currently scheduled. Discussed with patient and advised she contact us if symptoms return or if she has an elevated blood pressure at home. Patient verbalized understanding & had no questions.  Koren Bound RN BSN 10/29/19

## 2019-11-04 DIAGNOSIS — R41840 Attention and concentration deficit: Secondary | ICD-10-CM | POA: Diagnosis not present

## 2019-11-04 DIAGNOSIS — F331 Major depressive disorder, recurrent, moderate: Secondary | ICD-10-CM | POA: Diagnosis not present

## 2019-11-04 DIAGNOSIS — Z1331 Encounter for screening for depression: Secondary | ICD-10-CM | POA: Diagnosis not present

## 2019-11-04 DIAGNOSIS — Z1389 Encounter for screening for other disorder: Secondary | ICD-10-CM | POA: Diagnosis not present

## 2019-11-04 DIAGNOSIS — O1495 Unspecified pre-eclampsia, complicating the puerperium: Secondary | ICD-10-CM | POA: Diagnosis not present

## 2019-11-18 ENCOUNTER — Other Ambulatory Visit: Payer: Self-pay

## 2019-11-18 ENCOUNTER — Ambulatory Visit (INDEPENDENT_AMBULATORY_CARE_PROVIDER_SITE_OTHER): Payer: BC Managed Care – PPO | Admitting: Advanced Practice Midwife

## 2019-11-18 ENCOUNTER — Encounter: Payer: Self-pay | Admitting: Advanced Practice Midwife

## 2019-11-18 ENCOUNTER — Encounter: Payer: Self-pay | Admitting: *Deleted

## 2019-11-18 DIAGNOSIS — Z3202 Encounter for pregnancy test, result negative: Secondary | ICD-10-CM | POA: Diagnosis not present

## 2019-11-18 DIAGNOSIS — Z1389 Encounter for screening for other disorder: Secondary | ICD-10-CM | POA: Diagnosis not present

## 2019-11-18 DIAGNOSIS — Z30430 Encounter for insertion of intrauterine contraceptive device: Secondary | ICD-10-CM

## 2019-11-18 LAB — POCT PREGNANCY, URINE: Preg Test, Ur: NEGATIVE

## 2019-11-18 MED ORDER — LEVONORGESTREL 19.5 MCG/DAY IU IUD
INTRAUTERINE_SYSTEM | Freq: Once | INTRAUTERINE | Status: AC
  Administered 2019-11-18: 1 via INTRAUTERINE

## 2019-11-18 NOTE — Progress Notes (Signed)
Subjective:     Madeline Mccormick is a 23 y.o. female who presents for a postpartum visit. She is 4 weeks 5 days postpartum following a spontaneous vaginal delivery. I have fully reviewed the prenatal and intrapartum course. The delivery was at [redacted] weeks gestational weeks. Outcome: spontaneous vaginal delivery. Anesthesia: epidural. Postpartum course has been uncomplicated. Baby's course has been uncomplicated. Baby is feeding by bottle - Madeline Mccormick. Bleeding staining only. Bowel function is abnormal: pt reporting diarrhea for past 2-3 days.. Bladder function is normal. Patient is sexually active. Contraception method is IUD. Postpartum depression screening: negative.  Patient is s/p postpartum admission for Preeclampsia during which time she received Magnesium Sulfate.  The following portions of the patient's history were reviewed and updated as appropriate: allergies, current medications, past family history, past medical history, past social history, past surgical history and problem list.  Review of Systems A comprehensive review of systems was negative.   Objective:    There were no vitals taken for this visit.  General:  alert, cooperative, appears stated age and no distress   Breasts:  negative  Lungs: clear to auscultation bilaterally  Heart:  regular rate and rhythm, S1, S2 normal, no murmur, click, rub or gallop  Abdomen: soft, non-tender; bowel sounds normal; no masses,  no organomegaly   Vulva:  normal  Vagina: normal vagina  Cervix:  no cervical motion tenderness and no lesions  Corpus: not examined  Adnexa:  not evaluated  Rectal Exam: Not performed.        Assessment:     No concerning findings on postpartum exam. Pap smear not done at today's visit.    Patient continues to take Vasotec 10 daily. BP slightly elevated today, not severe range, no severe symptoms.  Plan:   1. Contraception: IUD (see separate procedure note) 2. Follow up in: 4 weeks for IUD string check or  as needed. 3. Continue Vasotec. Advised appointment with PCP in 2-4 weeks for continuation of blood pressure management  Mallie Snooks, MSN, CNM Certified Nurse Midwife, [facility] for [employer], [place] 11/18/19 12:10 PM

## 2019-11-18 NOTE — Patient Instructions (Addendum)
IUD PLACEMENT POST-PROCEDURE INSTRUCTIONS  1. You may take Ibuprofen, Aleve or Tylenol for pain if needed.  Cramping should resolve within in 24 hours.  2. You may have a small amount of spotting.  You should wear a mini pad for the next few days.  3. You may have intercourse after 24 hours.  If you using this for birth control, it is effective immediately.  4. You need to call if you have any pelvic pain, fever, heavy bleeding or foul smelling vaginal discharge.  Irregular bleeding is common the first several months after having an IUD placed. You do not need to call for this reason unless you are concerned.  5. Shower or bathe as normal  6. You should have a follow-up appointment in 4-8 weeks for a re-check to make sure you are not having any problems.  Levonorgestrel intrauterine device (IUD) What is this medicine? LEVONORGESTREL IUD (LEE voe nor jes trel) is a contraceptive (birth control) device. The device is placed inside the uterus by a healthcare professional. It is used to prevent pregnancy. This device can also be used to treat heavy bleeding that occurs during your period. This medicine may be used for other purposes; ask your health care provider or pharmacist if you have questions. COMMON BRAND NAME(S): Kyleena, LILETTA, Mirena, Skyla What should I tell my health care provider before I take this medicine? They need to know if you have any of these conditions:  abnormal Pap smear  cancer of the breast, uterus, or cervix  diabetes  endometritis  genital or pelvic infection now or in the past  have more than one sexual partner or your partner has more than one partner  heart disease  history of an ectopic or tubal pregnancy  immune system problems  IUD in place  liver disease or tumor  problems with blood clots or take blood-thinners  seizures  use intravenous drugs  uterus of unusual shape  vaginal bleeding that has not been explained  an unusual or  allergic reaction to levonorgestrel, other hormones, silicone, or polyethylene, medicines, foods, dyes, or preservatives  pregnant or trying to get pregnant  breast-feeding How should I use this medicine? This device is placed inside the uterus by a health care professional. Talk to your pediatrician regarding the use of this medicine in children. Special care may be needed. Overdosage: If you think you have taken too much of this medicine contact a poison control center or emergency room at once. NOTE: This medicine is only for you. Do not share this medicine with others. What if I miss a dose? This does not apply. Depending on the brand of device you have inserted, the device will need to be replaced every 3 to 6 years if you wish to continue using this type of birth control. What may interact with this medicine? Do not take this medicine with any of the following medications:  amprenavir  bosentan  fosamprenavir This medicine may also interact with the following medications:  aprepitant  armodafinil  barbiturate medicines for inducing sleep or treating seizures  bexarotene  boceprevir  griseofulvin  medicines to treat seizures like carbamazepine, ethotoin, felbamate, oxcarbazepine, phenytoin, topiramate  modafinil  pioglitazone  rifabutin  rifampin  rifapentine  some medicines to treat HIV infection like atazanavir, efavirenz, indinavir, lopinavir, nelfinavir, tipranavir, ritonavir  St. John's wort  warfarin This list may not describe all possible interactions. Give your health care provider a list of all the medicines, herbs, non-prescription drugs, or dietary   supplements you use. Also tell them if you smoke, drink alcohol, or use illegal drugs. Some items may interact with your medicine. What should I watch for while using this medicine? Visit your doctor or health care professional for regular check ups. See your doctor if you or your partner has sexual  contact with others, becomes HIV positive, or gets a sexual transmitted disease. This product does not protect you against HIV infection (AIDS) or other sexually transmitted diseases. You can check the placement of the IUD yourself by reaching up to the top of your vagina with clean fingers to feel the threads. Do not pull on the threads. It is a good habit to check placement after each menstrual period. Call your doctor right away if you feel more of the IUD than just the threads or if you cannot feel the threads at all. The IUD may come out by itself. You may become pregnant if the device comes out. If you notice that the IUD has come out use a backup birth control method like condoms and call your health care provider. Using tampons will not change the position of the IUD and are okay to use during your period. This IUD can be safely scanned with magnetic resonance imaging (MRI) only under specific conditions. Before you have an MRI, tell your healthcare provider that you have an IUD in place, and which type of IUD you have in place. What side effects may I notice from receiving this medicine? Side effects that you should report to your doctor or health care professional as soon as possible:  allergic reactions like skin rash, itching or hives, swelling of the face, lips, or tongue  fever, flu-like symptoms  genital sores  high blood pressure  no menstrual period for 6 weeks during use  pain, swelling, warmth in the leg  pelvic pain or tenderness  severe or sudden headache  signs of pregnancy  stomach cramping  sudden shortness of breath  trouble with balance, talking, or walking  unusual vaginal bleeding, discharge  yellowing of the eyes or skin Side effects that usually do not require medical attention (report to your doctor or health care professional if they continue or are bothersome):  acne  breast pain  change in sex drive or performance  changes in  weight  cramping, dizziness, or faintness while the device is being inserted  headache  irregular menstrual bleeding within first 3 to 6 months of use  nausea This list may not describe all possible side effects. Call your doctor for medical advice about side effects. You may report side effects to FDA at 1-800-FDA-1088. Where should I keep my medicine? This does not apply. NOTE: This sheet is a summary. It may not cover all possible information. If you have questions about this medicine, talk to your doctor, pharmacist, or health care provider.  2020 Elsevier/Gold Standard (2018-10-16 13:22:01)  

## 2019-11-18 NOTE — Progress Notes (Signed)
    GYNECOLOGY OFFICE PROCEDURE NOTE  Madeline Mccormick is a 23 y.o. [phone] here for [name] IUD insertion. No GYN concerns.  Last pap smear was on 04/24/2019 and was normal.  IUD Insertion Procedure Note Patient identified, informed consent performed, consent signed.   Discussed risks of irregular bleeding, cramping, infection, malpositioning or misplacement of the IUD outside the uterus which may require further procedure such as laparoscopy. Also discussed >99% contraception efficacy, increased risk of ectopic pregnancy with failure of method.  Time out was performed.  Urine pregnancy test negative.  Speculum placed in the vagina.  Cervix visualized.  Cleaned with Betadine x 2.  Grasped anteriorly with a single tooth tenaculum.  Uterus sounded to 7 cm.  Liletta IUD placed per manufacturer's recommendations.  Strings trimmed to 3 cm. Tenaculum was removed, good hemostasis noted.  Patient tolerated procedure well.   Patient was given post-procedure instructions.  She was advised to have backup contraception for one week.  Patient was also asked to check IUD strings periodically and follow up in 4 weeks for IUD check.   Mallie Snooks, MSN, CNM Certified Nurse Midwife, [facility] for [employer], [place] 11/18/19 12:10 PM

## 2019-12-11 DIAGNOSIS — F331 Major depressive disorder, recurrent, moderate: Secondary | ICD-10-CM | POA: Diagnosis not present

## 2019-12-11 DIAGNOSIS — O165 Unspecified maternal hypertension, complicating the puerperium: Secondary | ICD-10-CM | POA: Diagnosis not present

## 2019-12-17 ENCOUNTER — Encounter: Payer: Self-pay | Admitting: Nurse Practitioner

## 2019-12-17 ENCOUNTER — Other Ambulatory Visit: Payer: Self-pay

## 2019-12-17 ENCOUNTER — Ambulatory Visit (INDEPENDENT_AMBULATORY_CARE_PROVIDER_SITE_OTHER): Payer: BC Managed Care – PPO | Admitting: Nurse Practitioner

## 2019-12-17 VITALS — BP 136/93 | HR 105 | Wt 144.6 lb

## 2019-12-17 DIAGNOSIS — F329 Major depressive disorder, single episode, unspecified: Secondary | ICD-10-CM

## 2019-12-17 DIAGNOSIS — I10 Essential (primary) hypertension: Secondary | ICD-10-CM | POA: Diagnosis not present

## 2019-12-17 DIAGNOSIS — Z30431 Encounter for routine checking of intrauterine contraceptive device: Secondary | ICD-10-CM

## 2019-12-17 DIAGNOSIS — F32A Depression, unspecified: Secondary | ICD-10-CM

## 2019-12-17 DIAGNOSIS — Z975 Presence of (intrauterine) contraceptive device: Secondary | ICD-10-CM | POA: Insufficient documentation

## 2019-12-17 NOTE — Progress Notes (Signed)
    GYNECOLOGY OFFICE ENCOUNTER NOTE  History:  23 y.o. [id number] here today for today for IUD string check; Liletta  IUD was placed  11-18-19.  No complaints about the IUD, no concerning side effects.  The following portions of the patient's history were reviewed and updated as appropriate: allergies, current medications, past family history, past medical history, past social history, past surgical history and problem list. Last pap smear on 04-24-19 was normal.  Client has PCP that she has seen.  Was wanting some recommendation on continuing BP medication.    Additionally, client has had mood swings and wonders if that is normal or if it is a result of the hormone in the IUD.  Review of Systems:  Pertinent items are noted in HPI.   Objective:  Physical Exam Blood pressure (!) 136/93, pulse (!) 105, weight 144 lb 9.6 oz (65.6 kg), not currently breastfeeding. CONSTITUTIONAL: Well-developed, well-nourished female in no acute distress.  HENT:  Normocephalic, atraumatic. External right and left ear normal. Oropharynx is clear and moist EYES: Conjunctivae and EOM are normal. Pupils are equal, round, and reactive to light. No scleral icterus.  NECK: Normal range of motion, supple, CARDIOVASCULAR: Normal heart rate noted RESPIRATORY: Effort and breath sounds normal, no problems with respiration noted ABDOMEN: Soft, no distention noted.   PELVIC: Normal appearing external genitalia; normal appearing vaginal mucosa and cervix.  IUD strings visualized, about 2 cm in length outside cervix.   Assessment & Plan:  Hypertension Given that she was able to rest in the office and is still on BP medication at this time - Norvasc 10 mg - advised to keep taking BP meds daily.  Advised that this is likely hypertension now and she needs to use her BP cuff weekly and record her readings.  Reviewed symptoms that would indicate she might need to stop her medication.  Advised to have a preconceptual visit in prior  to becoming pregnant as she will be high risk if she still has hypertension.    IUD present Continue IUD.  Take Ibuprofen if needed for cramping.  Expect periodic cramping to be much better by 6 months with the IUD.  Depression Currently has a counselor that she is seeing for mood swings.  Advised the [facility] provider in the office is available if needed.  Discussed the change in family with a new baby as well as the current covid restrictions which also affect people who have a history of depression.  Suggested finding an online mother's support group.  Follow up in November 2021 for annual exam.  Earlie Server, RN, MSN, NP-BC Nurse Practitioner, [facility] for [employer], [place] [facility] 12/17/2019 12:26 PM

## 2020-01-07 ENCOUNTER — Encounter (HOSPITAL_COMMUNITY): Payer: Self-pay | Admitting: Obstetrics and Gynecology

## 2020-08-03 ENCOUNTER — Telehealth (HOSPITAL_COMMUNITY): Payer: Medicaid Other | Admitting: Psychiatry

## 2020-08-03 ENCOUNTER — Other Ambulatory Visit: Payer: Self-pay

## 2020-08-07 ENCOUNTER — Telehealth (INDEPENDENT_AMBULATORY_CARE_PROVIDER_SITE_OTHER): Payer: Medicaid Other | Admitting: Psychiatry

## 2020-08-07 ENCOUNTER — Encounter (HOSPITAL_COMMUNITY): Payer: Self-pay | Admitting: Psychiatry

## 2020-08-07 VITALS — Wt 150.0 lb

## 2020-08-07 DIAGNOSIS — F419 Anxiety disorder, unspecified: Secondary | ICD-10-CM

## 2020-08-07 DIAGNOSIS — F331 Major depressive disorder, recurrent, moderate: Secondary | ICD-10-CM | POA: Diagnosis not present

## 2020-08-07 DIAGNOSIS — F902 Attention-deficit hyperactivity disorder, combined type: Secondary | ICD-10-CM

## 2020-08-07 MED ORDER — LAMOTRIGINE 25 MG PO TABS: ORAL_TABLET | ORAL | 1 refills | Status: DC
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
## 2020-08-31 ENCOUNTER — Other Ambulatory Visit: Payer: Self-pay

## 2020-08-31 ENCOUNTER — Encounter: Payer: Self-pay | Admitting: Student

## 2020-08-31 ENCOUNTER — Ambulatory Visit (INDEPENDENT_AMBULATORY_CARE_PROVIDER_SITE_OTHER): Payer: Medicaid Other | Admitting: Student

## 2020-08-31 VITALS — BP 127/79 | HR 128 | Ht 64.0 in | Wt 143.0 lb

## 2020-08-31 DIAGNOSIS — Z30430 Encounter for insertion of intrauterine contraceptive device: Secondary | ICD-10-CM

## 2020-08-31 DIAGNOSIS — Z30433 Encounter for removal and reinsertion of intrauterine contraceptive device: Secondary | ICD-10-CM

## 2020-08-31 MED ORDER — PARAGARD INTRAUTERINE COPPER IU IUD: INTRAUTERINE_SYSTEM | Freq: Once | INTRAUTERINE | Status: AC

## 2020-08-31 NOTE — Progress Notes (Signed)
  History:  [name] is a 24 y.o. [phone] who presents to clinic today for birth control discussion. SHe reports that she had pain with her IUD on the right side. She reports that she thought it was her appendix but her PCP checked her out and ruled our appy.   She feels like her IUD makes her 'not feel like herself", feeling like periodsd are getting longer and "more rough".   She wants to try a paraguard, she is interested in a LARC without hormones.   The following portions of the patient's history were reviewed and updated as appropriate: allergies, current medications, family history, past medical history, social history, past surgical history and problem list.  Review of Systems:  ROS    Objective:  Physical Exam BP 127/79   Pulse (!) 128   Ht 5\' 4"  (1.626 m)   Wt 143 lb (64.9 kg)   LMP 08/04/2020 (Exact Date)   BMI 24.55 kg/m  Physical Exam Chaperone present: NEFG; IUD strings visualized.   Constitutional:      Appearance: Normal appearance.  HENT:     Head: Normocephalic.  Genitourinary:    General: Normal vulva.  Skin:    General: Skin is warm.  Neurological:     Mental Status: She is alert.       Labs and Imaging No results found for this or any previous visit (from the past 24 hour(s)).  No results found.   Assessment & Plan:    Approximately 30 minutes of total time was spent with this patient on counseling, and care.   08/06/2020, CNM 08/31/2020 11:50 AM  GYNECOLOGY CLINIC PROCEDURE NOTE   IUD Removal  Patient was in the dorsal lithotomy position, normal external genitalia was noted.  A speculum was placed in the patient's vagina, normal discharge was noted, no lesions. The multiparous cervix was visualized, no lesions, no abnormal discharge.  The strings of the IUD were grasped and pulled using ring forceps. The IUD was removed in its entirety.  Patient tolerated the procedure well.     IUD Insertion Procedure  Note Patient identified, informed consent performed, consent signed.   Discussed risks of irregular bleeding, cramping, infection, malpositioning or misplacement of the IUD outside the uterus which may require further procedure such as laparoscopy. Time out was performed.  Urine pregnancy test not done.  Speculum placed in the vagina.  Cervix visualized.  Cleaned with Betadine x 2.  Grasped anteriorly with a single tooth tenaculum.  Paraguard IUD placed per manufacturer's recommendations.  Strings trimmed to 3 cm. Tenaculum was removed, good hemostasis noted.  Patient tolerated procedure well.   Patient was given post-procedure instructions.  She was advised to have backup contraception for one week.  Patient was also asked to check IUD strings periodically and follow up in 4 weeks for IUD check.

## 2020-09-01 ENCOUNTER — Encounter: Payer: Self-pay | Admitting: General Practice

## 2020-09-09 ENCOUNTER — Encounter (HOSPITAL_COMMUNITY): Payer: Self-pay | Admitting: Psychiatry

## 2020-09-09 ENCOUNTER — Telehealth (INDEPENDENT_AMBULATORY_CARE_PROVIDER_SITE_OTHER): Payer: Medicaid Other | Admitting: Psychiatry

## 2020-09-09 ENCOUNTER — Other Ambulatory Visit: Payer: Self-pay

## 2020-09-09 VITALS — Wt 143.0 lb

## 2020-09-09 DIAGNOSIS — F331 Major depressive disorder, recurrent, moderate: Secondary | ICD-10-CM

## 2020-09-09 DIAGNOSIS — F419 Anxiety disorder, unspecified: Secondary | ICD-10-CM | POA: Diagnosis not present

## 2020-09-09 DIAGNOSIS — F902 Attention-deficit hyperactivity disorder, combined type: Secondary | ICD-10-CM | POA: Diagnosis not present

## 2020-09-09 MED ORDER — LISDEXAMFETAMINE DIMESYLATE 20 MG PO CAPS: 20.0000 mg | ORAL_CAPSULE | Freq: Every day | ORAL | 0 refills | Status: DC

## 2020-09-09 MED ORDER — ESCITALOPRAM OXALATE 20 MG PO TABS: 20.0000 mg | ORAL_TABLET | Freq: Every day | ORAL | 0 refills | Status: DC

## 2020-09-09 NOTE — Progress Notes (Signed)
Virtual Visit via Telephone Note  I connected with Madeline Mccormick on 09/09/20 at  3:20 PM EDT by telephone and verified that I am speaking with the correct person using two identifiers.  Location: Patient: [employer] [place] Provider: Home health   I discussed the limitations, risks, security and privacy concerns of performing an evaluation and management service by telephone and the availability of in person appointments. I also discussed with the patient that there may be a patient responsible charge related to this service. The patient expressed understanding and agreed to proceed.   History of Present Illness: Patient is evaluated by phone session.  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  She has a diagnosis of ADHD, depression and anxiety.  She was first evaluation 4 weeks ago.  She was referred from her PCP Dr. Deborha Payment.  We started her on Lamictal to help her mood lability but after 3 weeks she did not see any improvement and actually she felt more worst.  She is having a lot of anxiety and nervousness.  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  She had struggle with the school.  She has difficulty in attention, concentration, multitasking.  She had a good response with Vyvanse in the past but it was stopped after she got pregnant and never resumed because she diagnosed with high blood pressure.  She was taking BuSpar which was not working and we recommended discontinuing.  She like to go back on ADHD medicine because her blood pressure is much better and she is no longer taking Vasotec.  She is monitoring her blood pressure every day.  She recalled in the past taken Vyvanse 20 mg which helps her a lot and her focus, attention and concentration.  She lives with her fianc off campus.  Recently she had change of IUD and now she is taking ParaGard.  She admitted feeling emotional and sometimes poor sleep.  She has a lot of  anxiety and stress about her newborn, who is 86-month-old and behind his age.  Patient do not recall any side effects from Lamictal other than she felt feeling sick and did not work.  She has not reported any rash, itching or shakes.  Her mother lives in [place] and grandmother lives in [place].  Her appetite and weight is unchanged from the past.  She denies any impulsive behavior but sometimes she feel overwhelmed and having crying spells when she feels difficulty in her daily tasks.  She had a history of impulsive behavior but since last visit she does not reported any mood swings, impulsive behavior.  Past Psychiatric History: H/O ADHD diagnosed by PCP but no formal psychological testing was done. Took Vyvanse in 2014 until d/c in February 2020 after got pregnant.  Taking Lexapro since 2018. Tried BuSpar and Lamictal. No h/o suicidal attempt, inpatient treatment, hallucination, OCD, PTSD or panic attacks.   Psychiatric Specialty Exam: Physical Exam  Review of Systems  Weight 143 lb (64.9 kg), not currently breastfeeding.There is no height or weight on file to calculate BMI.  General Appearance: NA  Eye Contact:  NA  Speech:  Normal Rate  Volume:  Normal  Mood:  Anxious  Affect:  NA  Thought Process:  Descriptions of Associations: Intact  Orientation:  Full (Time, Place, and Person)  Thought Content:  Rumination  Suicidal Thoughts:  No  Homicidal Thoughts:  No  Memory:  Immediate;   Good Recent;   Fair  Remote;   Fair  Judgement:  Intact  Insight:  Present  Psychomotor Activity:  NA  Concentration:  Concentration: Fair and Attention Span: Fair  Recall:  [employer] of Knowledge:  Good  Language:  Good  Akathisia:  No  Handed:  Right  AIMS (if indicated):     Assets:  Communication Skills Desire for Improvement Housing Resilience Social Support Talents/Skills Transportation Vocational/Educational  ADL's:  Intact  Cognition:  WNL  Sleep:   poor      Assessment and  Plan: Major depressive disorder, recurrent.  Anxiety.  ADHD, combined type.  Discuss her anxiety and ADHD symptoms.  Patient had a good response with low-dose Vyvanse.  She like to go back on it since her blood pressure is much better and she is not taking any blood pressure medicine.  We will try Vyvanse 20 mg daily however I reminded that if she noticed her anxiety and blood pressure is getting worse then she need to stop the medicine immediately.  We also talked about Strattera but patient like to try Vyvanse for since she had a good response.  She also like to continue her Lexapro 20 mg from our office.  Discussed medication side effects and benefits.  Recommended to call us back if she has any question, concern acutely worsening of the symptom.  Follow-up in 4 weeks.  Spent 30 minutes.  More than 50% of time spent in psychoeducation, long-term prognosis and medication side effects.  Follow Up Instructions:    I discussed the assessment and treatment plan with the patient. The patient was provided an opportunity to ask questions and all were answered. The patient agreed with the plan and demonstrated an understanding of the instructions.   The patient was advised to call back or seek an in-person evaluation if the symptoms worsen or if the condition fails to improve as anticipated.  I provided 30 minutes of non-face-to-face time during this encounter.   Cleotis Nipper, MD

## 2020-09-11 IMAGING — CT CT ABD-PELV W/ CM
2 of 4 series · 17 of 46 positions shown, 19 images · IV contrast (Isovue)
Comparison: None.

CLINICAL DATA: Generalized abdominal pain, fever

EXAM:
CT ABDOMEN AND PELVIS WITH CONTRAST
TECHNIQUE: Multidetector CT imaging of the abdomen and pelvis was performed
using the standard protocol following bolus administration of
intravenous contrast.
CONTRAST:  100mL V7ISWX-DCC IOPAMIDOL (V7ISWX-DCC) INJECTION 61%

[Series 2: axial st · axial · 0.68mm/px · z∈[+555,+955]mm · 14 of 88 slices shown, 16 images]
[im 4/88  soft-tissue]
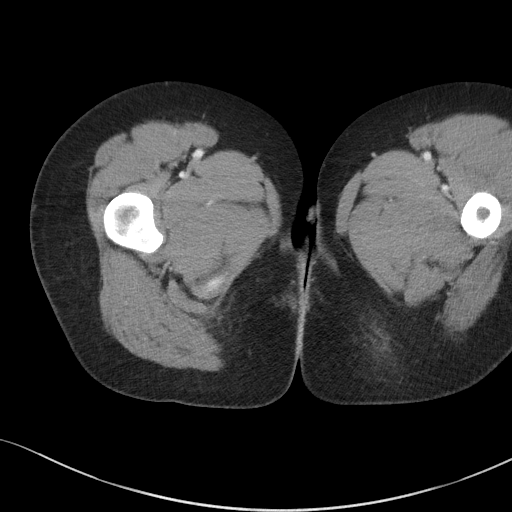
[im 4/88  bone]
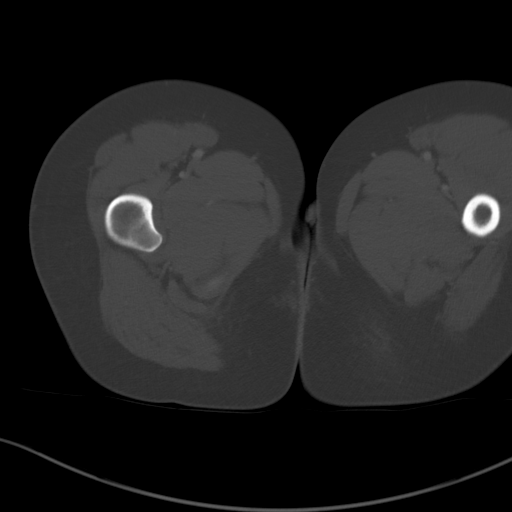
[im 11/88  soft-tissue]
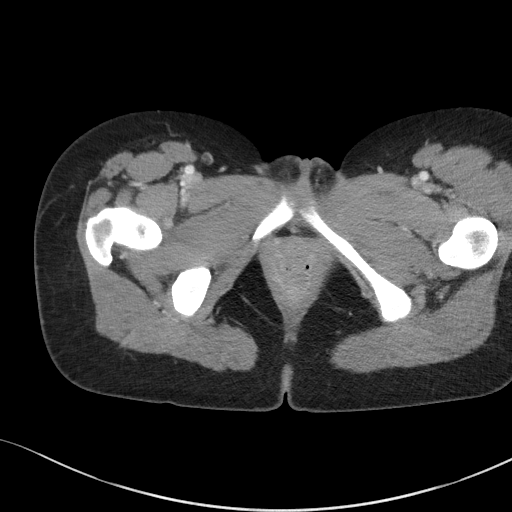
[im 18/88  soft-tissue]
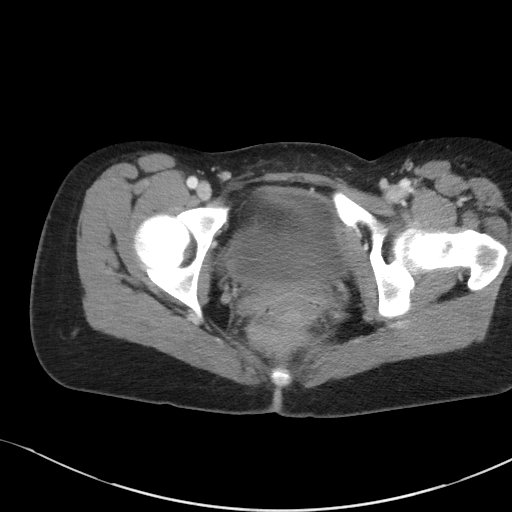
[im 25/88  soft-tissue]
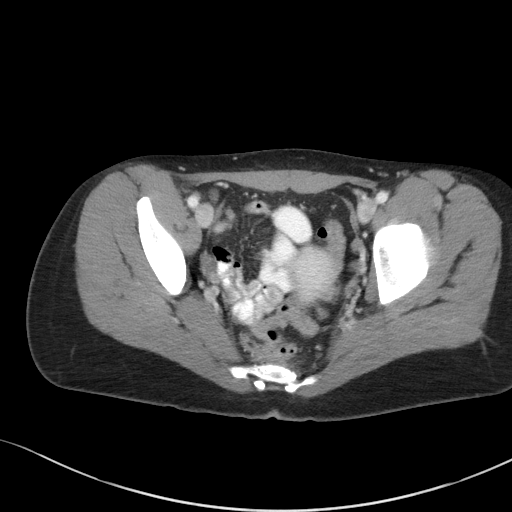
[im 28/88  soft-tissue]
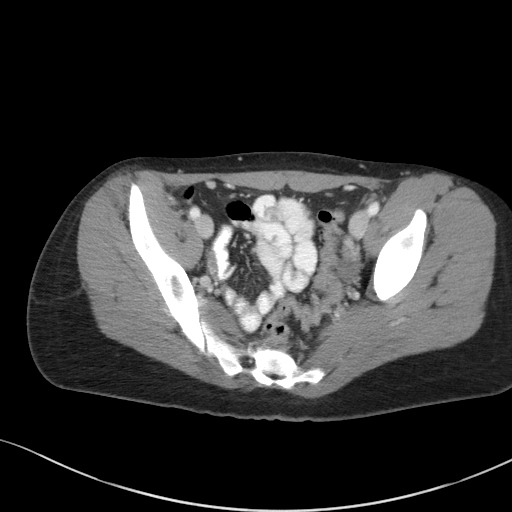
[im 35/88  soft-tissue]
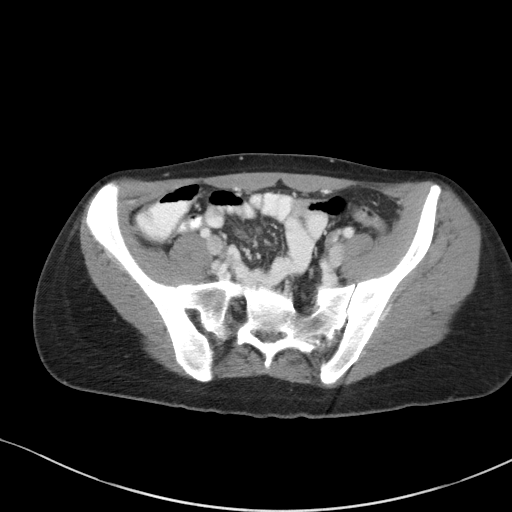
[im 42/88  soft-tissue]
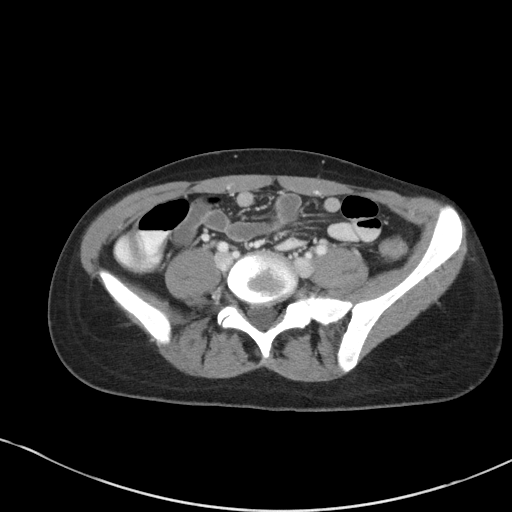
[im 46/88  soft-tissue]
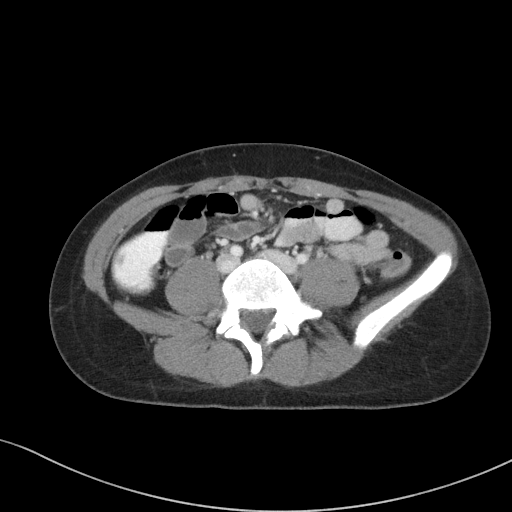
[im 53/88  soft-tissue]
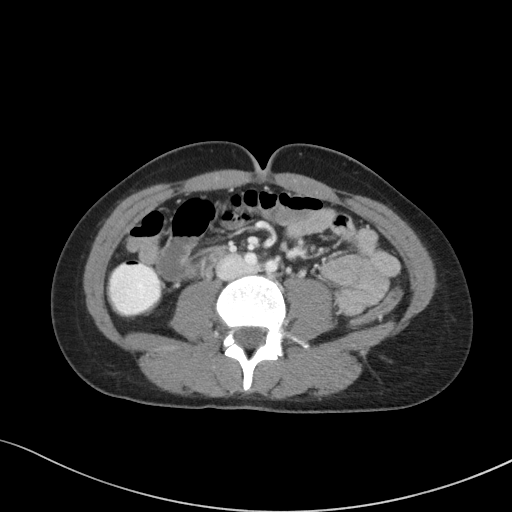
[im 53/88  bone]
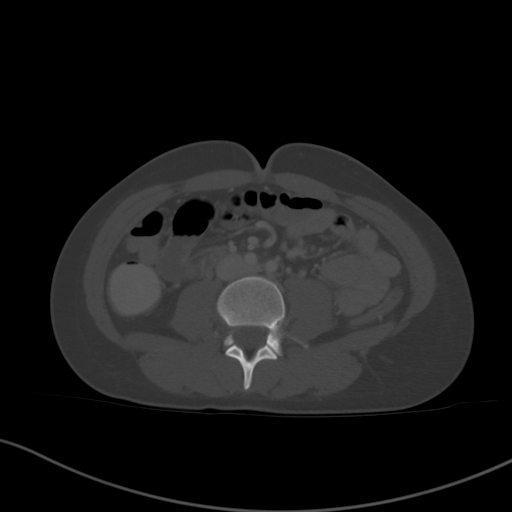
[im 60/88  soft-tissue]
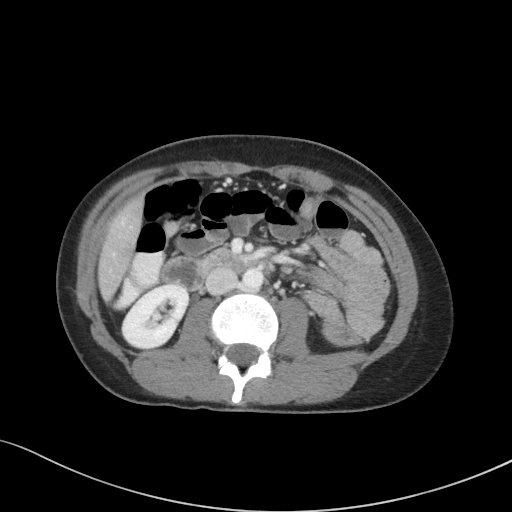
[im 67/88  soft-tissue]
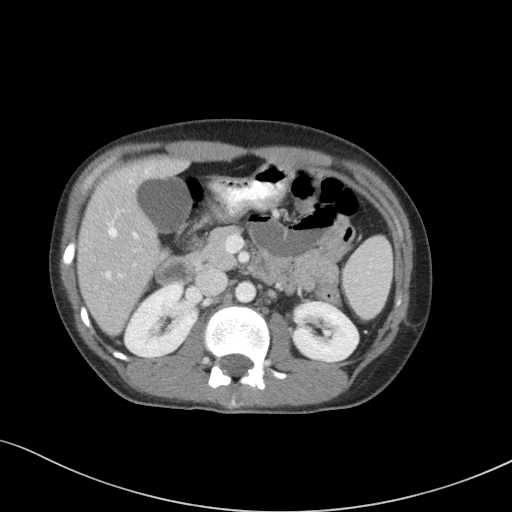
[im 70/88  soft-tissue]
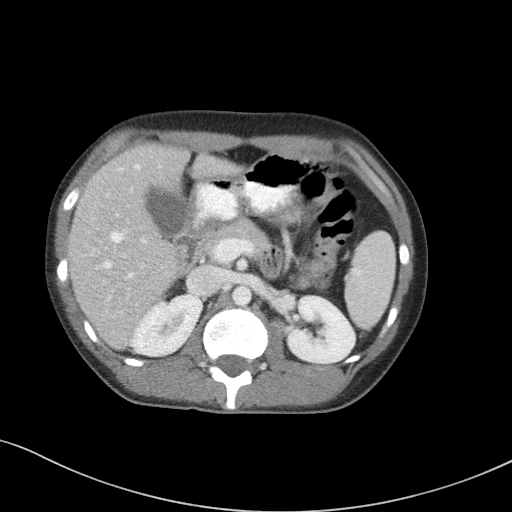
[im 77/88  soft-tissue]
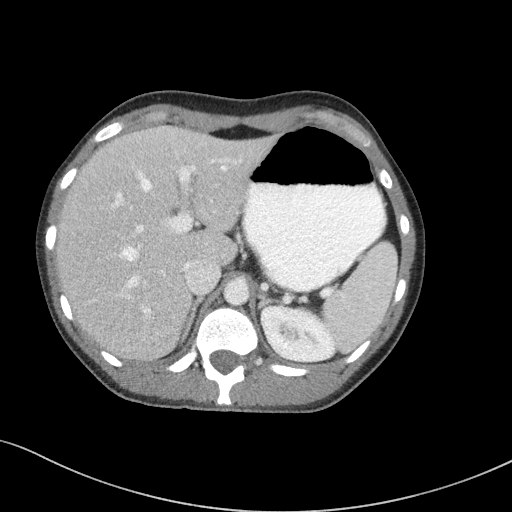
[im 84/88  soft-tissue]
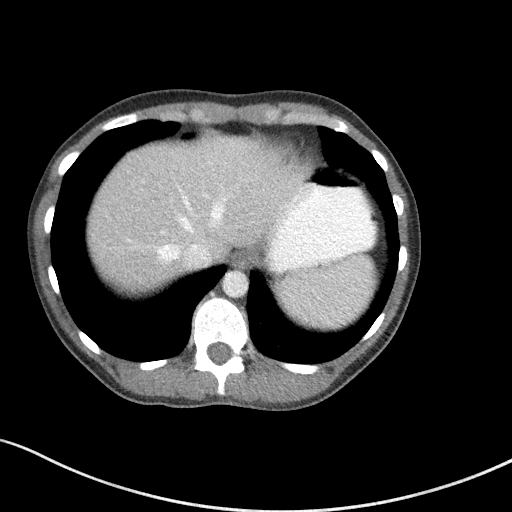

[Series 5: coronal st · coronal · 0.69mm/px · 3 of 83 slices shown]
[im 28/83  soft-tissue]
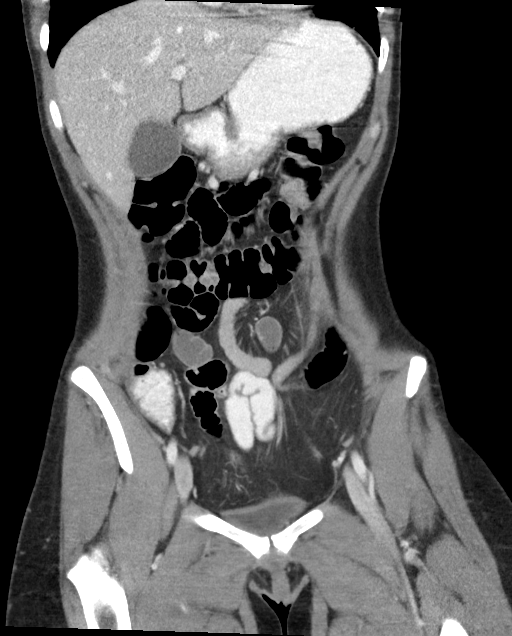
[im 37/83  soft-tissue]
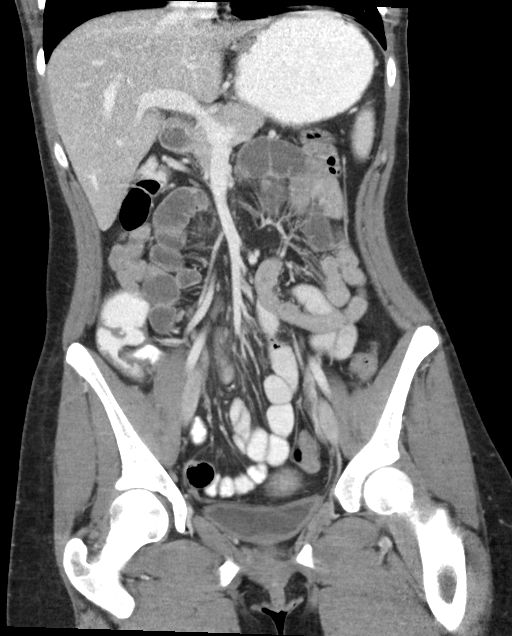
[im 46/83  soft-tissue]
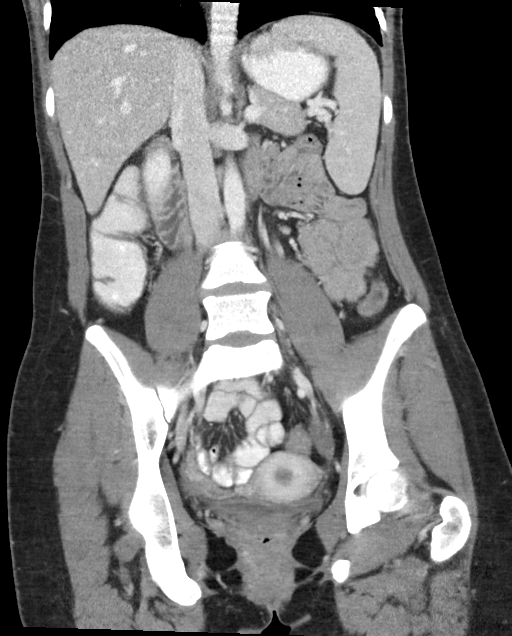

[17 of 46 positions shown; findings below may reference images not displayed]

FINDINGS: Lower chest: Lung bases are clear. No effusions. Heart is normal
size.

Hepatobiliary: No focal hepatic abnormality. Gallbladder
unremarkable.

Pancreas: No focal abnormality or ductal dilatation.

Spleen: No focal abnormality.  Normal size.

Adrenals/Urinary Tract: No adrenal abnormality. No focal renal
abnormality. No stones or hydronephrosis. Urinary bladder is
unremarkable.

Stomach/Bowel: Appendix is visualized and is normal. Stomach, large
and small bowel grossly unremarkable.

Vascular/Lymphatic: No evidence of aneurysm or adenopathy.

Reproductive: Uterus and adnexa unremarkable.  No mass.

Other: No free fluid or free air.

Musculoskeletal: No acute bony abnormality.
IMPRESSION: No acute findings in the abdomen or pelvis.

## 2020-10-01 ENCOUNTER — Other Ambulatory Visit: Payer: Self-pay

## 2020-10-01 ENCOUNTER — Other Ambulatory Visit (HOSPITAL_COMMUNITY)
Admission: RE | Admit: 2020-10-01 | Discharge: 2020-10-01 | Disposition: A | Discharge status: Home / Self Care | Payer: Medicaid Other | Source: Ambulatory Visit | Attending: Student | Admitting: Student

## 2020-10-01 ENCOUNTER — Ambulatory Visit (INDEPENDENT_AMBULATORY_CARE_PROVIDER_SITE_OTHER): Payer: Medicaid Other | Admitting: Student

## 2020-10-01 ENCOUNTER — Encounter: Payer: Self-pay | Admitting: Student

## 2020-10-01 VITALS — BP 113/77 | HR 88 | Ht 64.0 in | Wt 145.7 lb

## 2020-10-01 DIAGNOSIS — N898 Other specified noninflammatory disorders of vagina: Secondary | ICD-10-CM | POA: Insufficient documentation

## 2020-10-01 DIAGNOSIS — Z3040 Encounter for surveillance of contraceptives, unspecified: Secondary | ICD-10-CM

## 2020-10-01 NOTE — Progress Notes (Signed)
  History:  Madeline Mccormick is a 24 y.o. [phone] who presents to clinic today for string check and complaints about increased vaginal discharge.   The following portions of the patient's history were reviewed and updated as appropriate: allergies, current medications, family history, past medical history, social history, past surgical history and problem list.  Review of Systems:  Review of Systems  Constitutional: Negative.   HENT: Negative.   Cardiovascular: Negative.   Genitourinary: Negative.   Skin: Negative.   Neurological: Negative.   Psychiatric/Behavioral: Negative.       Objective:  Physical Exam BP 113/77   Pulse 88   Ht 5\' 4"  (1.626 m)   Wt 145 lb 11.2 oz (66.1 kg)   LMP 09/21/2020 (Approximate)   BMI 25.01 kg/m  Physical Exam Constitutional:      Appearance: Normal appearance.  Genitourinary:    General: Normal vulva.     Comments: NEFG; iud strings visualized. mucousy white/clear discharge in the vagina, no odor, no lesions, cervix is pink and non friable.  Neurological:     Mental Status: She is alert.       Labs and Imaging No results found for this or any previous visit (from the past 24 hour(s)).  No results found.   Assessment & Plan:   1. Discharge from the vagina   2. Encounter for surveillance of contraceptive device    -IUD in place, continue to check strings monthly - Cervicovaginal ancillary only( [place])    Approximately 20 minutes of total time was spent with this patient on counseling and care.   11/21/2020, CNM 10/01/2020 5:17 PM

## 2020-10-02 LAB — CERVICOVAGINAL ANCILLARY ONLY
Bacterial Vaginitis (gardnerella): POSITIVE — AB
Candida Glabrata: NEGATIVE
Candida Vaginitis: POSITIVE — AB
Comment: NEGATIVE
Comment: NEGATIVE
Comment: NEGATIVE

## 2020-10-05 ENCOUNTER — Telehealth: Payer: Self-pay | Admitting: Lactation Services

## 2020-10-05 MED ORDER — METRONIDAZOLE 500 MG PO TABS: 500.0000 mg | ORAL_TABLET | Freq: Two times a day (BID) | ORAL | 0 refills | Status: DC

## 2020-10-05 MED ORDER — FLUCONAZOLE 150 MG PO TABS: 150.0000 mg | ORAL_TABLET | Freq: Once | ORAL | 0 refills | Status: AC

## 2020-10-05 NOTE — Telephone Encounter (Signed)
Called patient with results of Aptima Swab. Patient did not answer. Patient with c/o Vaginal discharge in office on 10/14.   Prescriptions sent to Pharmacy.   Patient did not answer. LM to call the office for results or check My chart message ant that prescriptions have been sent to her Pharmacy. My Chart Message sent.

## 2020-10-07 ENCOUNTER — Encounter (HOSPITAL_COMMUNITY): Payer: Self-pay | Admitting: Psychiatry

## 2020-10-07 ENCOUNTER — Telehealth (INDEPENDENT_AMBULATORY_CARE_PROVIDER_SITE_OTHER): Payer: Medicaid Other | Admitting: Psychiatry

## 2020-10-07 ENCOUNTER — Other Ambulatory Visit: Payer: Self-pay

## 2020-10-07 VITALS — Wt 145.0 lb

## 2020-10-07 DIAGNOSIS — F902 Attention-deficit hyperactivity disorder, combined type: Secondary | ICD-10-CM | POA: Diagnosis not present

## 2020-10-07 DIAGNOSIS — F331 Major depressive disorder, recurrent, moderate: Secondary | ICD-10-CM

## 2020-10-07 DIAGNOSIS — F419 Anxiety disorder, unspecified: Secondary | ICD-10-CM | POA: Diagnosis not present

## 2020-10-07 MED ORDER — LISDEXAMFETAMINE DIMESYLATE 20 MG PO CAPS: 20.0000 mg | ORAL_CAPSULE | Freq: Every day | ORAL | 0 refills | Status: DC

## 2020-10-07 MED ORDER — ARIPIPRAZOLE 2 MG PO TABS: 2.0000 mg | ORAL_TABLET | Freq: Every day | ORAL | 1 refills | Status: DC

## 2020-10-07 NOTE — Progress Notes (Signed)
Virtual Visit via Telephone Note  I connected with Madeline Mccormick on 10/07/20 at  3:20 PM EDT by telephone and verified that I am speaking with the correct person using two identifiers.  Location: Patient: Home Provider: Office   I discussed the limitations, risks, security and privacy concerns of performing an evaluation and management service by telephone and the availability of in person appointments. I also discussed with the patient that there may be a patient responsible charge related to this service. The patient expressed understanding and agreed to proceed.   History of Present Illness: Patient is evaluated by phone session.  She is taking Vyvanse 20 mg which is helping her focus, attention and her mood.  She is able to do multitasking but is still school is very stressful.  Recently her fianc hurt himself with a golf court and now she bring him to the urgent care for headaches.  Patient feel her irritability and anger is coming back.  She gets easily frustrated and having mood swings.  She is taking Lexapro but she feels sometimes it does not work.  She used to take Lamictal but she quit after taking for 3 weeks because she did not see any improvement.  She like to try something to help her mood irritability and anger.  She gets easily frustrated.  She is sleeping good.  She has no insomnia, tremors.  She like Vyvanse.  Her blood pressure is a stable.  Her weight is stable.  She denies drinking or using any illegal substances.  She has 51-month-old baby who is going to be 1 year end of this month.  Her fianc has TBI.     Past Psychiatric History: H/O ADHD diagnosed by PCP but no formal psychological testing was done. Took Vyvanse in 2014 until d/c in February 2020 after got pregnant. Taking Lexapro since 2018. Tried BuSpar and Lamictal. No h/o suicidal attempt, inpatient treatment, hallucination, OCD, PTSD or panic attacks.  Psychiatric Specialty Exam: Physical Exam  Review of Systems   Weight 145 lb (65.8 kg), last menstrual period 09/21/2020, not currently breastfeeding.There is no height or weight on file to calculate BMI.  General Appearance: NA  Eye Contact:  NA  Speech:  Clear and Coherent  Volume:  Normal  Mood:  Anxious and Irritable  Affect:  NA  Thought Process:  Goal Directed  Orientation:  Full (Time, Place, and Person)  Thought Content:  WDL  Suicidal Thoughts:  No  Homicidal Thoughts:  No  Memory:  Immediate;   Good Recent;   Good Remote;   Good  Judgement:  Fair  Insight:  Present  Psychomotor Activity:  NA  Concentration:  Concentration: Fair and Attention Span: Fair  Recall:  Good  Fund of Knowledge:  Good  Language:  Good  Akathisia:  No  Handed:  Right  AIMS (if indicated):     Assets:  Communication Skills Desire for Improvement Housing Resilience Social Support Transportation Vocational/Educational  ADL's:  Intact  Cognition:  WNL  Sleep:         Assessment and Plan: Major depressive disorder, recurrent.  Anxiety.  ADHD, combined type.  Patient doing better with Vyvanse and she is not having side effects including hypertension or tremors.  However she does notice irritability and getting easily frustrated.  We talked about trying Abilify which she has never tried before.  She agreed with the plan.  I recommend to cut down her Lexapro to 10 mg after 1 week and continue Vyvanse 20  mg daily.  I explained Abilify side effects, EPS and metabolic syndrome.  Recommended to call us back if she has any question or any concern.  Follow-up in 4 weeks.    Follow Up Instructions:    I discussed the assessment and treatment plan with the patient. The patient was provided an opportunity to ask questions and all were answered. The patient agreed with the plan and demonstrated an understanding of the instructions.   The patient was advised to call back or seek an in-person evaluation if the symptoms worsen or if the condition fails to improve  as anticipated.  I provided 15 minutes of non-face-to-face time during this encounter.   Cleotis Nipper, MD

## 2020-10-21 ENCOUNTER — Other Ambulatory Visit: Payer: Self-pay | Admitting: Lactation Services

## 2020-10-21 MED ORDER — FLUCONAZOLE 150 MG PO TABS: 150.0000 mg | ORAL_TABLET | Freq: Once | ORAL | 0 refills | Status: AC

## 2020-10-21 NOTE — Progress Notes (Signed)
Reordered Diflucan as patient misplaced.

## 2020-11-05 ENCOUNTER — Telehealth (INDEPENDENT_AMBULATORY_CARE_PROVIDER_SITE_OTHER): Payer: Medicaid Other | Admitting: Psychiatry

## 2020-11-05 ENCOUNTER — Encounter (HOSPITAL_COMMUNITY): Payer: Self-pay | Admitting: Psychiatry

## 2020-11-05 ENCOUNTER — Other Ambulatory Visit: Payer: Self-pay

## 2020-11-05 DIAGNOSIS — F331 Major depressive disorder, recurrent, moderate: Secondary | ICD-10-CM

## 2020-11-05 DIAGNOSIS — F419 Anxiety disorder, unspecified: Secondary | ICD-10-CM | POA: Diagnosis not present

## 2020-11-05 DIAGNOSIS — F902 Attention-deficit hyperactivity disorder, combined type: Secondary | ICD-10-CM

## 2020-11-05 MED ORDER — ARIPIPRAZOLE 5 MG PO TABS: 5.0000 mg | ORAL_TABLET | Freq: Every day | ORAL | 1 refills | Status: DC

## 2020-11-05 MED ORDER — LISDEXAMFETAMINE DIMESYLATE 20 MG PO CAPS: 20.0000 mg | ORAL_CAPSULE | Freq: Every day | ORAL | 0 refills | Status: DC

## 2020-11-05 NOTE — Progress Notes (Addendum)
I discussed the limitations, risks, security and privacy concerns of performing an evaluation and management service by telephone and the availability of in person appointments. I also discussed with the patient that there may be a patient responsible charge related to this service. The patient expressed understanding and agreed to proceed.     Virtual Visit via Telephone Note  I connected with Madeline Mccormick on 11/05/20 at 11:00 AM EST by telephone and verified that I am speaking with the correct person using two identifiers.  Location: Patient: Home Provider: Home Office   I discussed the limitations, risks, security and privacy concerns of performing an evaluation and management service by telephone and the availability of in person appointments. I also discussed with the patient that there may be a patient responsible charge related to this service. The patient expressed understanding and agreed to proceed.   History of Present Illness: Patient is evaluated by phone session.  She is by herself on the phone.  On the last visit we tried her on Abilify 2 mg.  She has not noticed a huge difference but she feels things stable.  She still have sometimes irritability, anxiety and depression but no major issues.  Her school is going okay.  She admitted getting some time easily frustrated but she also reported that she is taking Lexapro 20 mg and forgot to cut it down.  She is taking Vyvanse 20 mg is helping her focus, attention.  Her school is going well and she is making average grades.  She lives with her 34-year-old boy and her fianc who has TBI.  Patient is looking forward for holidays.  She denies any tremors shakes or any EPS.  She denies any suicidal thoughts, psychosis or any mania.  Her appetite is okay.  Her weight is stable.  Past Psychiatric History: H/OADHDdiagnosed byPCPbutno formal psychological testing was done.TookVyvanse in 2014 until d/cin February 2020 after got  pregnant.TakingLexaprosince 2018. TriedBuSparand Lamictal. No h/osuicidal attempt, inpatient treatment,hallucination,OCD, PTSD or panic attacks.   Psychiatric Specialty Exam: Physical Exam  Review of Systems  Weight 145 lb (65.8 kg), not currently breastfeeding.There is no height or weight on file to calculate BMI.  General Appearance: NA  Eye Contact:  NA  Speech:  Normal Rate  Volume:  Normal  Mood:  Irritable  Affect:  NA  Thought Process:  Goal Directed  Orientation:  Full (Time, Place, and Person)  Thought Content:  Rumination  Suicidal Thoughts:  No  Homicidal Thoughts:  No  Memory:  Immediate;   Good Recent;   Good Remote;   Good  Judgement:  Good  Insight:  Good  Psychomotor Activity:  NA  Concentration:  Concentration: Good and Attention Span: Fair  Recall:  Good  Fund of Knowledge:  Good  Language:  Good  Akathisia:  No  Handed:  Right  AIMS (if indicated):     Assets:  Communication Skills Desire for Improvement Housing Resilience Social Support  ADL's:  Intact  Cognition:  WNL  Sleep:         Assessment and Plan: Major depressive disorder, recurrent.  Anxiety.  ADHD, combined type.  Patient had forgot to cut down the Lexapro.  She still have mood lability and irritability.  I recommend to try Abilify 5 mg and cut down Lexapro 10 mg daily.  We will continue Vyvanse 20 mg daily because helping her focus and attention.  However sometimes she does feel it wears off in the evening.  We will consider adjusting the  dose on her next appointment.  She reported her blood pressure is stable.  Recommended to call us back if she has any question or any concern.  Follow-up in 6 weeks.  Follow Up Instructions:    I discussed the assessment and treatment plan with the patient. The patient was provided an opportunity to ask questions and all were answered. The patient agreed with the plan and demonstrated an understanding of the instructions.   The patient was  advised to call back or seek an in-person evaluation if the symptoms worsen or if the condition fails to improve as anticipated.  I provided 16 minutes of non-face-to-face time during this encounter.   Cleotis Nipper, MD

## 2020-12-10 ENCOUNTER — Other Ambulatory Visit: Payer: Self-pay | Admitting: Psychiatry

## 2020-12-10 ENCOUNTER — Telehealth (HOSPITAL_COMMUNITY): Payer: Self-pay | Admitting: *Deleted

## 2020-12-10 DIAGNOSIS — F902 Attention-deficit hyperactivity disorder, combined type: Secondary | ICD-10-CM

## 2020-12-10 MED ORDER — LISDEXAMFETAMINE DIMESYLATE 20 MG PO CAPS: 20.0000 mg | ORAL_CAPSULE | Freq: Every day | ORAL | 0 refills | Status: DC

## 2020-12-10 NOTE — Telephone Encounter (Signed)
Pt called requesting refill of the VyVanse. Pt has an upcoming appointment with Dr. Lolly Mustache on 12/30. Please review.

## 2020-12-10 NOTE — Telephone Encounter (Signed)
Ordered.   I have utilized the [place] Controlled Substances Reporting System (PMP [id number]) to confirm adherence regarding the patient's medication. My review reveals appropriate prescription fills.

## 2020-12-17 ENCOUNTER — Encounter (HOSPITAL_COMMUNITY): Payer: Self-pay | Admitting: Psychiatry

## 2020-12-17 ENCOUNTER — Telehealth (INDEPENDENT_AMBULATORY_CARE_PROVIDER_SITE_OTHER): Payer: Medicaid Other | Admitting: Psychiatry

## 2020-12-17 ENCOUNTER — Other Ambulatory Visit: Payer: Self-pay

## 2020-12-17 DIAGNOSIS — F902 Attention-deficit hyperactivity disorder, combined type: Secondary | ICD-10-CM

## 2020-12-17 DIAGNOSIS — F331 Major depressive disorder, recurrent, moderate: Secondary | ICD-10-CM | POA: Diagnosis not present

## 2020-12-17 DIAGNOSIS — F419 Anxiety disorder, unspecified: Secondary | ICD-10-CM

## 2020-12-17 MED ORDER — ESCITALOPRAM OXALATE 20 MG PO TABS: 20.0000 mg | ORAL_TABLET | Freq: Every day | ORAL | 0 refills | Status: DC

## 2020-12-17 MED ORDER — ARIPIPRAZOLE 5 MG PO TABS: 5.0000 mg | ORAL_TABLET | Freq: Every day | ORAL | 0 refills | Status: DC

## 2020-12-17 MED ORDER — LISDEXAMFETAMINE DIMESYLATE 20 MG PO CAPS: 20.0000 mg | ORAL_CAPSULE | Freq: Every day | ORAL | 0 refills | Status: DC

## 2020-12-17 NOTE — Progress Notes (Signed)
Virtual Visit via Telephone Note  I connected with Madeline Mccormick on 12/17/20 at  3:40 PM EST by telephone and verified that I am speaking with the correct person using two identifiers.  Location: Patient: Home Provider: Home Office   I discussed the limitations, risks, security and privacy concerns of performing an evaluation and management service by telephone and the availability of in person appointments. I also discussed with the patient that there may be a patient responsible charge related to this service. The patient expressed understanding and agreed to proceed.   History of Present Illness: Patient is evaluated by phone session.  She is on the phone by herself.  On the last visit we increase Abilify and recommend to cut down the Lexapro.  However she stopped the Lexapro after cutting down to 10 mg.  She forgot that she need to stay on 10 mg.  She noticed increased anxiety, depression and she is back on Lexapro.  She still have some anxiety and she felt better when she was taking 20 mg.  However she also noticed increase Abilify to help her mood lability, anger and frustration.  Her sleep is good.  She admitted there are some difficult days when she has to handle her fianc who has TBI and does not remember very well.  His 34-month-old son needs ENT evaluation because of hearing issues and may require tube placement.  She is taking Vyvanse and she noted that is helping her focus, attention and she is making good grades and she is happy about it.  She has no tremors, shakes or any EPS.  She denies any suicidal thoughts.  Her weight is stable.  Her sleep is good.  She had a good Christmas.  She like to remain on Lexapro 20 mg.  Past Psychiatric History: H/OADHDdiagnosed byPCPbutno formal psychological testing was done.TookVyvanse in 2014 until d/cin February 2020 after got pregnant.TakingLexaprosince 2018. TriedBuSparand Lamictal. No h/osuicidal attempt, inpatient  treatment,hallucination,OCD, PTSD or panic attacks.   Psychiatric Specialty Exam: Physical Exam  Review of Systems  Weight 145 lb (65.8 kg), not currently breastfeeding.There is no height or weight on file to calculate BMI.  General Appearance: NA  Eye Contact:  NA  Speech:  Clear and Coherent  Volume:  Normal  Mood:  Anxious  Affect:  NA  Thought Process:  Goal Directed  Orientation:  Full (Time, Place, and Person)  Thought Content:  Logical  Suicidal Thoughts:  No  Homicidal Thoughts:  No  Memory:  Immediate;   Good Recent;   Good Remote;   Good  Judgement:  Intact  Insight:  Present  Psychomotor Activity:  NA  Concentration:  Concentration: Good and Attention Span: Good  Recall:  Good  Fund of Knowledge:  Good  Language:  Good  Akathisia:  No  Handed:  Right  AIMS (if indicated):     Assets:  Communication Skills Desire for Improvement Housing Resilience Talents/Skills Transportation  ADL's:  Intact  Cognition:  WNL  Sleep:   ok      Assessment and Plan: Major depressive disorder, recurrent.  Anxiety.  ADHD, combined type.  Recommend to go back on Lexapro 20 mg since it was working well.  She apologized not following the recommendation.  She had stopped taking the Lexapro instead of taking 10 mg only.  But she does feel 20 mg was working better.  I will continue that.  She also like to keep the current dose of Abilify that is helping her mood lability.  She has no concerns or side effects. She has no rash or any itching.  Continue Abilify 5 mg daily, Vyvanse 20 mg daily and Lexapro 20 mg daily.  Discussed medication side effects and benefits.  Recommended to call us back if any question or any concern.  Follow-up in 3 months.  Follow Up Instructions:    I discussed the assessment and treatment plan with the patient. The patient was provided an opportunity to ask questions and all were answered. The patient agreed with the plan and demonstrated an understanding  of the instructions.   The patient was advised to call back or seek an in-person evaluation if the symptoms worsen or if the condition fails to improve as anticipated.  I provided 15 minutes of non-face-to-face time during this encounter.   Cleotis Nipper, MD

## 2021-01-21 ENCOUNTER — Telehealth (HOSPITAL_COMMUNITY): Payer: Self-pay | Admitting: *Deleted

## 2021-01-21 NOTE — Telephone Encounter (Signed)
I am showing last fill 12/17/20 with no refills. Spoke with pharmacist who says there's one on hold and they will fill. Thanks.

## 2021-01-21 NOTE — Telephone Encounter (Signed)
Pt called requesting refill on the VyVanse. Pt has an appointment upcoming on 03/10/21.

## 2021-01-21 NOTE — Telephone Encounter (Signed)
Her refill was given on January 23.  Unless she has not picked up the refill, she is not due for another 20 days.

## 2021-02-15 ENCOUNTER — Other Ambulatory Visit (HOSPITAL_COMMUNITY): Payer: Self-pay | Admitting: Psychiatry

## 2021-02-15 DIAGNOSIS — F331 Major depressive disorder, recurrent, moderate: Secondary | ICD-10-CM

## 2021-02-15 DIAGNOSIS — F419 Anxiety disorder, unspecified: Secondary | ICD-10-CM

## 2021-02-26 ENCOUNTER — Telehealth (HOSPITAL_COMMUNITY): Payer: Self-pay | Admitting: *Deleted

## 2021-02-26 DIAGNOSIS — F902 Attention-deficit hyperactivity disorder, combined type: Secondary | ICD-10-CM

## 2021-02-26 NOTE — Telephone Encounter (Signed)
Pt called requesting a refill of her VyVanse. Pt has an upcoming appointment on 03/10/21.

## 2021-03-01 MED ORDER — LISDEXAMFETAMINE DIMESYLATE 20 MG PO CAPS: 20.0000 mg | ORAL_CAPSULE | Freq: Every day | ORAL | 0 refills | Status: DC

## 2021-03-01 NOTE — Telephone Encounter (Signed)
Send to [facility], [place].

## 2021-03-10 ENCOUNTER — Other Ambulatory Visit: Payer: Self-pay

## 2021-03-10 ENCOUNTER — Telehealth (INDEPENDENT_AMBULATORY_CARE_PROVIDER_SITE_OTHER): Payer: Medicaid Other | Admitting: Psychiatry

## 2021-03-10 ENCOUNTER — Encounter (HOSPITAL_COMMUNITY): Payer: Self-pay | Admitting: Psychiatry

## 2021-03-10 VITALS — Wt 145.0 lb

## 2021-03-10 DIAGNOSIS — F902 Attention-deficit hyperactivity disorder, combined type: Secondary | ICD-10-CM

## 2021-03-10 DIAGNOSIS — F331 Major depressive disorder, recurrent, moderate: Secondary | ICD-10-CM | POA: Diagnosis not present

## 2021-03-10 DIAGNOSIS — F419 Anxiety disorder, unspecified: Secondary | ICD-10-CM | POA: Diagnosis not present

## 2021-03-10 MED ORDER — ARIPIPRAZOLE 5 MG PO TABS: 5.0000 mg | ORAL_TABLET | Freq: Every day | ORAL | 0 refills | Status: DC

## 2021-03-10 MED ORDER — ESCITALOPRAM OXALATE 20 MG PO TABS: 20.0000 mg | ORAL_TABLET | Freq: Every day | ORAL | 0 refills | Status: DC

## 2021-03-10 NOTE — Progress Notes (Signed)
Virtual Visit via Telephone Note  I connected with Madeline Mccormick on 03/10/21 at  3:40 PM EDT by telephone and verified that I am speaking with the correct person using two identifiers.  Location: Patient: Home Provider: Home Office   I discussed the limitations, risks, security and privacy concerns of performing an evaluation and management service by telephone and the availability of in person appointments. I also discussed with the patient that there may be a patient responsible charge related to this service. The patient expressed understanding and agreed to proceed.   History of Present Illness: Patient is evaluated by phone session.  She is stable on her medication.  She feels the Abilify and Lexapro and Vyvanse together working very well for her mood attention and focus.  She denies any crying spells, feeling of hopelessness or any panic attack.  Energy level is good.  Her appetite is okay and her weight is stable.  She reported good relationship with her fianc who has TBI.  Her 33-month old son is getting tube placement next week.  Patient reported no tremors, shakes or any EPS.  Since the Lexapro dose adjusted she feels it is working well.  She denies any anhedonia or any suicidal thoughts.   Past Psychiatric History: H/OADHDdiagnosed byPCPbutno formal psychological testing was done.TookVyvanse in 2014 until d/cin February 2020 after got pregnant.TakingLexaprosince 2018. TriedBuSparand Lamictal. No h/osuicidal attempt, inpatient treatment,hallucination,OCD, PTSD or panic attacks.    Psychiatric Specialty Exam: Physical Exam  Review of Systems  Weight 145 lb (65.8 kg), not currently breastfeeding.There is no height or weight on file to calculate BMI.  General Appearance: NA  Eye Contact:  NA  Speech:  Clear and Coherent  Volume:  Normal  Mood:  Anxious  Affect:  NA  Thought Process:  Goal Directed  Orientation:  Full (Time, Place, and Person)  Thought Content:   Logical  Suicidal Thoughts:  No  Homicidal Thoughts:  No  Memory:  Immediate;   Good Recent;   Good Remote;   Good  Judgement:  Good  Insight:  Good  Psychomotor Activity:  NA  Concentration:  Concentration: Good and Attention Span: Good  Recall:  Good  Fund of Knowledge:  Good  Language:  Good  Akathisia:  No  Handed:  Right  AIMS (if indicated):     Assets:  Communication Skills Desire for Improvement Housing Resilience Social Support Transportation  ADL's:  Intact  Cognition:  WNL  Sleep:   ok      Assessment and Plan: Major depressive disorder, recurrent.  Anxiety.  ADHD, combined type.  Patient is stable on Lexapro 20 mg and Abilify 5 mg daily.  She recently picked up her Vyvanse 20 mg and will call us for new refill.  Recommended to call us back if there is any question or any concern.  Follow-up in 3 months.  Follow Up Instructions:    I discussed the assessment and treatment plan with the patient. The patient was provided an opportunity to ask questions and all were answered. The patient agreed with the plan and demonstrated an understanding of the instructions.   The patient was advised to call back or seek an in-person evaluation if the symptoms worsen or if the condition fails to improve as anticipated.  I provided 12 minutes of non-face-to-face time during this encounter.   Cleotis Nipper, MD

## 2021-04-05 ENCOUNTER — Telehealth (HOSPITAL_COMMUNITY): Payer: Self-pay | Admitting: *Deleted

## 2021-04-05 DIAGNOSIS — F902 Attention-deficit hyperactivity disorder, combined type: Secondary | ICD-10-CM

## 2021-04-05 MED ORDER — LISDEXAMFETAMINE DIMESYLATE 20 MG PO CAPS: 20.0000 mg | ORAL_CAPSULE | Freq: Every day | ORAL | 0 refills | Status: DC

## 2021-04-05 NOTE — Telephone Encounter (Signed)
Pt called requesting refill of VyVanse. Pt has an upcoming appointment on 06/11/21.

## 2021-04-05 NOTE — Telephone Encounter (Signed)
Done

## 2021-05-11 ENCOUNTER — Telehealth (HOSPITAL_COMMUNITY): Payer: Self-pay | Admitting: *Deleted

## 2021-05-11 DIAGNOSIS — F902 Attention-deficit hyperactivity disorder, combined type: Secondary | ICD-10-CM

## 2021-05-11 MED ORDER — LISDEXAMFETAMINE DIMESYLATE 20 MG PO CAPS: 20.0000 mg | ORAL_CAPSULE | Freq: Every day | ORAL | 0 refills | Status: DC

## 2021-05-11 NOTE — Telephone Encounter (Signed)
Pt called requesting refill of the Vyvanse 20 mg. Pt has an appointment on the books for 06/11/21. Thanks.

## 2021-05-11 NOTE — Telephone Encounter (Signed)
Done

## 2021-06-11 ENCOUNTER — Telehealth (INDEPENDENT_AMBULATORY_CARE_PROVIDER_SITE_OTHER): Payer: Medicaid Other | Admitting: Psychiatry

## 2021-06-11 ENCOUNTER — Other Ambulatory Visit: Payer: Self-pay

## 2021-06-11 ENCOUNTER — Encounter (HOSPITAL_COMMUNITY): Payer: Self-pay | Admitting: Psychiatry

## 2021-06-11 DIAGNOSIS — F902 Attention-deficit hyperactivity disorder, combined type: Secondary | ICD-10-CM

## 2021-06-11 DIAGNOSIS — F419 Anxiety disorder, unspecified: Secondary | ICD-10-CM | POA: Diagnosis not present

## 2021-06-11 DIAGNOSIS — F331 Major depressive disorder, recurrent, moderate: Secondary | ICD-10-CM

## 2021-06-11 MED ORDER — LISDEXAMFETAMINE DIMESYLATE 30 MG PO CAPS: 30.0000 mg | ORAL_CAPSULE | Freq: Every day | ORAL | 0 refills | Status: DC

## 2021-06-11 MED ORDER — ARIPIPRAZOLE 5 MG PO TABS: 5.0000 mg | ORAL_TABLET | Freq: Every day | ORAL | 0 refills | Status: DC

## 2021-06-11 MED ORDER — ESCITALOPRAM OXALATE 20 MG PO TABS: 20.0000 mg | ORAL_TABLET | Freq: Every day | ORAL | 0 refills | Status: DC

## 2021-06-11 NOTE — Progress Notes (Signed)
Virtual Visit via Telephone Note  I connected with Madeline Mccormick on 06/11/21 at 10:00 AM EDT by telephone and verified that I am speaking with the correct person using two identifiers.  Location: Patient: Home Provider: Home Office   I discussed the limitations, risks, security and privacy concerns of performing an evaluation and management service by telephone and the availability of in person appointments. I also discussed with the patient that there may be a patient responsible charge related to this service. The patient expressed understanding and agreed to proceed.   History of Present Illness: Patient is evaluated by phone session.  She is taking Vyvanse Abilify and Lexapro.  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  She does not have fixed schedule yet but she would like to work as a part-time.  She is taking 1 class in the summer.  She is hoping to graduate in 2024 and her major is human development.  Currently she is taking the class of family diversity.  She admitted sometimes struggle with her grades.  Last class she had a A but now she is having B.  She feels her medicine is wearing off in the afternoon and she gets tired, lack of focus, difficulty multitasking.  She denies any crying spells or any feeling of hopelessness or worthlessness.  Her energy level is in the morning good but as the day goes she feels drained.  She started couple therapy with a therapist named Morrie Sheldon via [facility].  Her son also started speech therapy.  Patient told therapy going well.  She reported good relationship with her fianc who has TBI.  Patient denies any tremors, shakes, rash or any itching.  She denies any paranoia, hallucination or any suicidal thoughts.  Her appetite is okay and her weight is unchanged from the past.  She had a visit with the PCP earlier this year and she was told her labs are normal.   Past Psychiatric History: H/O ADHD diagnosed by PCP but no formal psychological testing was  done. Took Vyvanse in 2014 until d/c in February 2020 after got pregnant.  Taking Lexapro since 2018. Tried BuSpar and Lamictal. No h/o suicidal attempt, inpatient treatment, hallucination, OCD, PTSD or panic attacks.   Psychiatric Specialty Exam: Physical Exam  Review of Systems  Weight 145 lb (65.8 kg), not currently breastfeeding.There is no height or weight on file to calculate BMI.  General Appearance: NA  Eye Contact:  NA  Speech:  Clear and Coherent  Volume:  Normal  Mood:  Euthymic  Affect:  NA  Thought Process:  Goal Directed  Orientation:  Full (Time, Place, and Person)  Thought Content:  WDL  Suicidal Thoughts:  No  Homicidal Thoughts:  No  Memory:  Immediate;   Good Recent;   Good Remote;   Good  Judgement:  Intact  Insight:  Present  Psychomotor Activity:  NA  Concentration:  Concentration: Fair and Attention Span: Fair  Recall:  Good  Fund of Knowledge:  Good  Language:  Good  Akathisia:  No  Handed:  Right  AIMS (if indicated):     Assets:  Communication Skills Desire for Improvement Housing Resilience Talents/Skills Transportation  ADL's:  Intact  Cognition:  WNL  Sleep:   ok      Assessment and Plan: Major depressive disorder, recurrent.  Anxiety.  ADHD, combined type.  Patient is starting to notice her Vyvanse wearing off as she noticed difficulty in attention, focus and multitasking.  She also struggle  in class which she is taking online.  She is hoping to graduate in 2024 with a major in human development.  So far patient tolerating her medication but wondering if Vyvanse dose can be increased.  Currently she is taking Vyvanse 20 mg.  I recommend we can try up the dose to 30 mg however sometimes increase stimulant can cause worsening of anxiety and insomnia.  Patient promised to give [place] a call back if higher dose caused any concerns or side effects.  She will continue Lexapro 20 mg daily, Abilify 5 mg daily and we will increase Vyvanse 30 mg in the  morning.  I recommend to have her blood work results faxed to [place] from [facility] P[facility] at [facility].  Fax number provided.  I recommend to call us back if she has any question, concern or any worsening of symptoms.  Follow up in 3 months.  Follow Up Instructions:    I discussed the assessment and treatment plan with the patient. The patient was provided an opportunity to ask questions and all were answered. The patient agreed with the plan and demonstrated an understanding of the instructions.   The patient was advised to call back or seek an in-person evaluation if the symptoms worsen or if the condition fails to improve as anticipated.  I provided 16 minutes of non-face-to-face time during this encounter.   Cleotis Nipper, MD

## 2021-07-20 ENCOUNTER — Telehealth (HOSPITAL_COMMUNITY): Payer: Self-pay

## 2021-07-20 NOTE — Telephone Encounter (Signed)
SPOKE WITH TRISHA AT [place] [employer] REGARDING A PA ON PATIENT'S ARIPIPRAZOLE 5MG  TABLET. SHE STATED THAT THE PHARMACY RECEIVED A PAID CLAIM ON 07/17/21 FOR THIS MEDICATION AND THAT IT WILL GO THROUGH AT THE END OF THIS MONTH (AUGUST). THEREFORE, NO PA IS REQUIRED

## 2021-07-30 ENCOUNTER — Telehealth (HOSPITAL_COMMUNITY): Payer: Self-pay | Admitting: *Deleted

## 2021-07-30 DIAGNOSIS — F902 Attention-deficit hyperactivity disorder, combined type: Secondary | ICD-10-CM

## 2021-07-30 NOTE — Telephone Encounter (Signed)
Pt called requesting refill of VyVanse 30 mg. Pt has an upcoming appointment on 09/13/21. Thanks.

## 2021-07-31 MED ORDER — LISDEXAMFETAMINE DIMESYLATE 30 MG PO CAPS: 30.0000 mg | ORAL_CAPSULE | Freq: Every day | ORAL | 0 refills | Status: DC

## 2021-07-31 NOTE — Telephone Encounter (Signed)
Send to [facility] in [place], [place]

## 2021-08-30 ENCOUNTER — Telehealth (HOSPITAL_COMMUNITY): Payer: Self-pay

## 2021-08-30 NOTE — Telephone Encounter (Signed)
Medication management - Prior authorization for patient's prescribed Aripiprazole 5 mg tablets submitted online with CoverMyMeds to [facility] after receiving a fax notice from pt's pharmacy this prior authorization was needed.  Results pending.

## 2021-09-09 ENCOUNTER — Telehealth (HOSPITAL_COMMUNITY): Payer: Self-pay | Admitting: *Deleted

## 2021-09-09 DIAGNOSIS — F902 Attention-deficit hyperactivity disorder, combined type: Secondary | ICD-10-CM

## 2021-09-09 NOTE — Telephone Encounter (Signed)
Pt called requesting a refill of Vyvanse 30 mg. Pt has an appointment on 09/21/21. Please review.

## 2021-09-10 MED ORDER — LISDEXAMFETAMINE DIMESYLATE 30 MG PO CAPS: 30.0000 mg | ORAL_CAPSULE | Freq: Every day | ORAL | 0 refills | Status: DC

## 2021-09-10 NOTE — Telephone Encounter (Signed)
Done

## 2021-09-13 ENCOUNTER — Telehealth (HOSPITAL_COMMUNITY): Payer: Medicaid Other | Admitting: Psychiatry

## 2021-09-14 ENCOUNTER — Telehealth (HOSPITAL_COMMUNITY): Payer: Medicaid Other | Admitting: Psychiatry

## 2021-09-21 ENCOUNTER — Telehealth (HOSPITAL_BASED_OUTPATIENT_CLINIC_OR_DEPARTMENT_OTHER): Payer: Medicaid Other | Admitting: Psychiatry

## 2021-09-21 ENCOUNTER — Other Ambulatory Visit: Payer: Self-pay

## 2021-09-21 ENCOUNTER — Encounter (HOSPITAL_COMMUNITY): Payer: Self-pay | Admitting: Psychiatry

## 2021-09-21 DIAGNOSIS — F902 Attention-deficit hyperactivity disorder, combined type: Secondary | ICD-10-CM | POA: Diagnosis not present

## 2021-09-21 DIAGNOSIS — F419 Anxiety disorder, unspecified: Secondary | ICD-10-CM | POA: Diagnosis not present

## 2021-09-21 DIAGNOSIS — F331 Major depressive disorder, recurrent, moderate: Secondary | ICD-10-CM | POA: Diagnosis not present

## 2021-09-21 MED ORDER — LISDEXAMFETAMINE DIMESYLATE 30 MG PO CAPS: 30.0000 mg | ORAL_CAPSULE | Freq: Every day | ORAL | 0 refills | Status: DC

## 2021-09-21 MED ORDER — ESCITALOPRAM OXALATE 20 MG PO TABS: 20.0000 mg | ORAL_TABLET | Freq: Every day | ORAL | 0 refills | Status: DC

## 2021-09-21 MED ORDER — ARIPIPRAZOLE 5 MG PO TABS: 5.0000 mg | ORAL_TABLET | Freq: Every day | ORAL | 0 refills | Status: DC

## 2021-09-21 NOTE — Progress Notes (Signed)
Virtual Visit via Telephone Note  I connected with Madeline Mccormick on 09/21/21 at  4:20 PM EDT by telephone and verified that I am speaking with the correct person using two identifiers.  Location: Patient: Home Provider: Home Office   I discussed the limitations, risks, security and privacy concerns of performing an evaluation and management service by telephone and the availability of in person appointments. I also discussed with the patient that there may be a patient responsible charge related to this service. The patient expressed understanding and agreed to proceed.   History of Present Illness: Patient is evaluated by phone session.  On the last visit we increased Vyvanse 30 mg as she was struggle with focus and attention.  She has noticed much improvement and she is able to finish a task on time.  She is taking now 4 classes and making good grades.  She does not feel that her medicine is now wearing off in the afternoon as she noticed keep the good energy level in the evening.  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  She is keeping a good balance in her life.  She is also in therapy with Morrie Sheldon and son is getting speech therapy.  She reported her relationship with the parents is also going very well.  She denies any mania, psychosis, hallucination.  She denies any tremor or shakes or any EPS.  Her depression is stable.  She sleeps good.  She is hoping to graduate in 2024 with a major in human development.  Patient denies any drinking or using any illegal substances.  Past Psychiatric History: H/O ADHD diagnosed by PCP but no formal psychological testing was done. Took Vyvanse in 2014 until d/c in February 2020 after got pregnant.  Taking Lexapro since 2018. Tried BuSpar and Lamictal. No h/o suicidal attempt, inpatient treatment, hallucination, OCD, PTSD or panic attacks.   Psychiatric Specialty Exam: Physical Exam  Review of Systems  Weight 148 lb (67.1 kg),  not currently breastfeeding.There is no height or weight on file to calculate BMI.  General Appearance: NA  Eye Contact:  NA  Speech:  Clear and Coherent and Normal Rate  Volume:  Normal  Mood:  Euthymic  Affect:  NA  Thought Process:  Goal Directed  Orientation:  Full (Time, Place, and Person)  Thought Content:  Logical  Suicidal Thoughts:  No  Homicidal Thoughts:  No  Memory:  Immediate;   Good Recent;   Good Remote;   Good  Judgement:  Intact  Insight:  Present  Psychomotor Activity:  NA  Concentration:  Concentration: Good and Attention Span: Good  Recall:  Good  Fund of Knowledge:  Good  Language:  Good  Akathisia:  No  Handed:  Right  AIMS (if indicated):     Assets:  Communication Skills Desire for Improvement Housing Resilience Social Support Talents/Skills Transportation  ADL's:  Intact  Cognition:  WNL  Sleep:   ok      Assessment and Plan: Major depressive disorder, recurrent.  Anxiety.  ADHD, combined type.  Patient doing much better since the Vyvanse dose increase from 20mg  to 30 mg.  She does not get tired and feeling things are going very well.  Her part-time job few nights in the evening also going very well.  Discussed medication side effects and benefits.  Continue Vyvanse 30 mg daily, Abilify 5 mg daily and Lexapro 20 mg daily.  Recommended to call back if she  has any question or any concern.  Follow-up in 3 months.  Follow Up Instructions:    I discussed the assessment and treatment plan with the patient. The patient was provided an opportunity to ask questions and all were answered. The patient agreed with the plan and demonstrated an understanding of the instructions.   The patient was advised to call back or seek an in-person evaluation if the symptoms worsen or if the condition fails to improve as anticipated.  I provided 21 minutes of non-face-to-face time during this encounter.   Cleotis Nipper, MD

## 2021-11-25 ENCOUNTER — Telehealth (HOSPITAL_COMMUNITY): Payer: Self-pay | Admitting: *Deleted

## 2021-11-25 DIAGNOSIS — F902 Attention-deficit hyperactivity disorder, combined type: Secondary | ICD-10-CM

## 2021-11-25 MED ORDER — LISDEXAMFETAMINE DIMESYLATE 30 MG PO CAPS: 30.0000 mg | ORAL_CAPSULE | Freq: Every day | ORAL | 0 refills | Status: DC

## 2021-11-25 NOTE — Telephone Encounter (Signed)
Done

## 2021-11-25 NOTE — Telephone Encounter (Signed)
Pt called requesting refill of Vyvanse 30 mg. Pt has an upcoming appointment on 01/01/22.

## 2021-12-22 ENCOUNTER — Other Ambulatory Visit: Payer: Self-pay

## 2021-12-22 ENCOUNTER — Encounter (HOSPITAL_COMMUNITY): Payer: Self-pay | Admitting: Psychiatry

## 2021-12-22 ENCOUNTER — Telehealth (HOSPITAL_BASED_OUTPATIENT_CLINIC_OR_DEPARTMENT_OTHER): Payer: Medicaid Other | Admitting: Psychiatry

## 2021-12-22 DIAGNOSIS — F331 Major depressive disorder, recurrent, moderate: Secondary | ICD-10-CM

## 2021-12-22 DIAGNOSIS — F419 Anxiety disorder, unspecified: Secondary | ICD-10-CM | POA: Diagnosis not present

## 2021-12-22 DIAGNOSIS — F902 Attention-deficit hyperactivity disorder, combined type: Secondary | ICD-10-CM | POA: Diagnosis not present

## 2021-12-22 MED ORDER — ESCITALOPRAM OXALATE 20 MG PO TABS: 20.0000 mg | ORAL_TABLET | Freq: Every day | ORAL | 0 refills | Status: DC

## 2021-12-22 MED ORDER — ARIPIPRAZOLE 5 MG PO TABS: 5.0000 mg | ORAL_TABLET | Freq: Every day | ORAL | 0 refills | Status: DC

## 2021-12-22 MED ORDER — LISDEXAMFETAMINE DIMESYLATE 30 MG PO CAPS: 30.0000 mg | ORAL_CAPSULE | Freq: Every day | ORAL | 0 refills | Status: DC

## 2021-12-22 NOTE — Progress Notes (Signed)
Virtual Visit via Telephone Note  I connected with Madeline Mccormick on 12/22/21 at  3:20 PM EST by telephone and verified that I am speaking with the correct person using two identifiers.  Location: Patient: Home Provider: Home Office   I discussed the limitations, risks, security and privacy concerns of performing an evaluation and management service by telephone and the availability of in person appointments. I also discussed with the patient that there may be a patient responsible charge related to this service. The patient expressed understanding and agreed to proceed.   History of Present Illness: Patient is evaluated by phone session.  She reported lately feeling tired and fatigued but denies any depressive thoughts.  She had a visit with the PCP and she did blood work including thyroid but she was told everything is normal.  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  Her school is going well and she is making good grades.  Currently she is on break but will resume school very soon.  She is also going for a couple therapy with Morrie Sheldon.  Upon questioning if she is taking all her medication she replied that she has been without Abilify for a while.  She is not sure why as she may have forgotten to pick up the medication.  She is taking Lexapro and Vyvanse.  Overall she feels her attention concentration is good.  She is able to do multitasking.  Her PCP recommended to try Prozac but she has not picked up the medication as she like to discuss with the Clinical research associate.  Patient denies any hallucination, paranoia, suicidal thoughts.  She has no other stressors in her life.  She is hoping to graduate next year with majored in human development.  She denies drinking or using any illegal substances.   Past Psychiatric History: H/O ADHD diagnosed by PCP but no formal psychological testing was done. Took Vyvanse in 2014 until d/c in February 2020 after got pregnant.  Taking  Lexapro since 2018. Tried BuSpar and Lamictal. Wellbutrin did not work. No h/o suicidal attempt, inpatient treatment, hallucination, OCD, PTSD or panic attacks.  Psychiatric Specialty Exam: Physical Exam  Review of Systems  Weight 168 lb (76.2 kg).There is no height or weight on file to calculate BMI.  General Appearance: NA  Eye Contact:  NA  Speech:  Clear and Coherent and Normal Rate  Volume:  Normal  Mood:   tired  Affect:  NA  Thought Process:  Goal Directed  Orientation:  Full (Time, Place, and Person)  Thought Content:  WDL  Suicidal Thoughts:  No  Homicidal Thoughts:  No  Memory:  Immediate;   Good Recent;   Good Remote;   Good  Judgement:  Intact  Insight:  Present  Psychomotor Activity:  NA  Concentration:  Concentration: Good and Attention Span: Good  Recall:  Good  Fund of Knowledge:  Good  Language:  Good  Akathisia:  No  Handed:  Right  AIMS (if indicated):     Assets:  Communication Skills Desire for Improvement Housing Social Support Transportation  ADL's:  Intact  Cognition:  WNL  Sleep:   8 hrs      Assessment and Plan: Major depressive disorder, recurrent.  Anxiety.  ADHD, combined type.  Discussed excessive weight with increased fatigue.  Recommend to check her vitamin levels especially vitamin D and hemoglobin A1c.  I also recommend she should go back to Abilify which was helping her motivation, energy level  and fatigue.  She agreed to give a try and if restarting Abilify did not help and all other labs are normal then she may consider switching Lexapro to Prozac.  In the past she had tried Wellbutrin that did not help.  Other option is Trintellix.  Patient agreed to first do remaining blood work and go back to [employer].  I recommend to call us back if she has any further questions or any concern.  Patient like to have a follow-up in 3 months.  Continue Lexapro 20 mg daily and Vyvanse 30 mg daily.  Follow Up Instructions:    I discussed the  assessment and treatment plan with the patient. The patient was provided an opportunity to ask questions and all were answered. The patient agreed with the plan and demonstrated an understanding of the instructions.   The patient was advised to call back or seek an in-person evaluation if the symptoms worsen or if the condition fails to improve as anticipated.  I provided 20 minutes of non-face-to-face time during this encounter.   Cleotis Nipper, MD

## 2022-02-03 ENCOUNTER — Telehealth (HOSPITAL_COMMUNITY): Payer: Self-pay | Admitting: *Deleted

## 2022-02-03 NOTE — Telephone Encounter (Signed)
Pt called requesting a refill of VyVanse 30 mg. Pt has an appointment scheduled for 03/23/22.

## 2022-02-04 ENCOUNTER — Other Ambulatory Visit (HOSPITAL_COMMUNITY): Payer: Self-pay | Admitting: Psychiatry

## 2022-02-04 ENCOUNTER — Telehealth (HOSPITAL_COMMUNITY): Payer: Self-pay | Admitting: *Deleted

## 2022-02-04 DIAGNOSIS — F902 Attention-deficit hyperactivity disorder, combined type: Secondary | ICD-10-CM

## 2022-02-04 MED ORDER — LISDEXAMFETAMINE DIMESYLATE 30 MG PO CAPS: 30.0000 mg | ORAL_CAPSULE | Freq: Every day | ORAL | 0 refills | Status: DC

## 2022-02-04 NOTE — Telephone Encounter (Signed)
sent 

## 2022-02-04 NOTE — Telephone Encounter (Signed)
Pt of Dr. Sheela Stack requesting a refill of the Vyvanse 30 mg. Pt has an appointment upcoming with Dr.Arfeen on 03/23/22. Please review. Thank you.

## 2022-02-05 ENCOUNTER — Encounter (HOSPITAL_BASED_OUTPATIENT_CLINIC_OR_DEPARTMENT_OTHER): Payer: Self-pay

## 2022-02-05 ENCOUNTER — Emergency Department (HOSPITAL_BASED_OUTPATIENT_CLINIC_OR_DEPARTMENT_OTHER)
Admission: EM | Admit: 2022-02-05 | Discharge: 2022-02-05 | Disposition: A | Discharge status: Home / Self Care | Payer: Medicaid Other | Attending: Emergency Medicine | Admitting: Emergency Medicine

## 2022-02-05 ENCOUNTER — Other Ambulatory Visit: Payer: Self-pay

## 2022-02-05 ENCOUNTER — Emergency Department (HOSPITAL_BASED_OUTPATIENT_CLINIC_OR_DEPARTMENT_OTHER): Payer: Medicaid Other

## 2022-02-05 DIAGNOSIS — S7011XA Contusion of right thigh, initial encounter: Secondary | ICD-10-CM | POA: Diagnosis not present

## 2022-02-05 DIAGNOSIS — R233 Spontaneous ecchymoses: Secondary | ICD-10-CM

## 2022-02-05 DIAGNOSIS — X58XXXA Exposure to other specified factors, initial encounter: Secondary | ICD-10-CM | POA: Insufficient documentation

## 2022-02-05 DIAGNOSIS — S8991XA Unspecified injury of right lower leg, initial encounter: Secondary | ICD-10-CM | POA: Diagnosis present

## 2022-02-05 LAB — CK: Total CK: 80 U/L (ref 38–234)

## 2022-02-05 LAB — CBC WITH DIFFERENTIAL/PLATELET
Abs Immature Granulocytes: 0.01 10*3/uL (ref 0.00–0.07)
Basophils Absolute: 0 10*3/uL (ref 0.0–0.1)
Basophils Relative: 0 %
Eosinophils Absolute: 0.2 10*3/uL (ref 0.0–0.5)
Eosinophils Relative: 3 %
HCT: 37.5 % (ref 36.0–46.0)
Hemoglobin: 12.4 g/dL (ref 12.0–15.0)
Immature Granulocytes: 0 %
Lymphocytes Relative: 25 %
Lymphs Abs: 1.4 10*3/uL (ref 0.7–4.0)
MCH: 30.5 pg (ref 26.0–34.0)
MCHC: 33.1 g/dL (ref 30.0–36.0)
MCV: 92.1 fL (ref 80.0–100.0)
Monocytes Absolute: 0.6 10*3/uL (ref 0.1–1.0)
Monocytes Relative: 10 %
Neutro Abs: 3.3 10*3/uL (ref 1.7–7.7)
Neutrophils Relative %: 62 %
Platelets: 397 10*3/uL (ref 150–400)
RBC: 4.07 MIL/uL (ref 3.87–5.11)
RDW: 12.8 % (ref 11.5–15.5)
WBC: 5.4 10*3/uL (ref 4.0–10.5)
nRBC: 0 % (ref 0.0–0.2)

## 2022-02-05 LAB — COMPREHENSIVE METABOLIC PANEL
ALT: 10 U/L (ref 0–44)
AST: 20 U/L (ref 15–41)
Albumin: 3.8 g/dL (ref 3.5–5.0)
Alkaline Phosphatase: 68 U/L (ref 38–126)
Anion gap: 7 (ref 5–15)
BUN: 11 mg/dL (ref 6–20)
CO2: 26 mmol/L (ref 22–32)
Calcium: 8.5 mg/dL — ABNORMAL LOW (ref 8.9–10.3)
Chloride: 105 mmol/L (ref 98–111)
Creatinine, Ser: 0.75 mg/dL (ref 0.44–1.00)
GFR, Estimated: >60 mL/min (ref >60)
Glucose, Bld: 114 mg/dL — ABNORMAL HIGH (ref 70–99)
Potassium: 3.1 mmol/L — ABNORMAL LOW (ref 3.5–5.1)
Sodium: 138 mmol/L (ref 135–145)
Total Bilirubin: 0.2 mg/dL — ABNORMAL LOW (ref 0.3–1.2)
Total Protein: 7.2 g/dL (ref 6.5–8.1)

## 2022-02-05 LAB — PROTIME-INR
INR: 0.9 (ref 0.8–1.2)
Prothrombin Time: 12.5 seconds (ref 11.4–15.2)

## 2022-02-05 LAB — HCG, QUANTITATIVE, PREGNANCY: hCG, Beta Chain, Quant, S: <1 m[IU]/mL (ref <5)

## 2022-02-05 LAB — SEDIMENTATION RATE: Sed Rate: 8 mm/hr (ref 0–22)

## 2022-02-05 NOTE — ED Provider Notes (Signed)
[facility] EMERGENCY DEPARTMENT Provider Note   CSN: [id number] Arrival date & time: 02/05/22  1615     History  Chief Complaint  Patient presents with   Leg Pain    Madeline Mccormick is a 26 y.o. female with no pertinent past medical history who presents today for evaluation of a large bruise.  She states that yesterday she noted a large bruise and pain on her right side.  She denies any trauma.  She denies any episodes where she may have had impaired memory and could have sustained trauma such as heavy alcohol use or substance use.  She reports that in general she does note that she bruises very easily.  She does not take any blood thinning medications.  She denies any new medications or exposures.  I did specifically ask patient if she had been harmed by anyone else or assaulted, she denies this.  She does state that her grandmother has some issue with her platelets but she does not know what that was, when it started.  HPI     Home Medications Prior to Admission medications   Medication Sig Start Date End Date Taking? Authorizing Provider  ARIPiprazole (ABILIFY) 5 MG tablet Take 1 tablet (5 mg total) by mouth daily. 12/22/21   Arfeen, Phillips Grout, MD  escitalopram (LEXAPRO) 20 MG tablet Take 1 tablet (20 mg total) by mouth daily. 12/22/21   Arfeen, Phillips Grout, MD  lisdexamfetamine (VYVANSE) 30 MG capsule Take 1 capsule (30 mg total) by mouth daily. 02/04/22   Myrlene Broker, MD  metroNIDAZOLE (FLAGYL) 500 MG tablet Take 1 tablet (500 mg total) by mouth 2 (two) times daily. 10/05/20   Marylene Land, CNM      Allergies    Patient has no known allergies.    Review of Systems   Review of Systems  Physical Exam Updated Vital Signs BP 120/78 (BP Location: Left Arm)    Pulse (!) 57    Temp 98.1 F (36.7 C) (Oral)    Resp 14    Ht 5\' 4"  (1.626 m)    Wt 70.3 kg    SpO2 100%    BMI 26.61 kg/m  Physical Exam Vitals and nursing note reviewed.  Constitutional:       General: She is not in acute distress.    Appearance: She is not diaphoretic.  HENT:     Head: Normocephalic and atraumatic.  Eyes:     General: No scleral icterus.       Right eye: No discharge.        Left eye: No discharge.     Conjunctiva/sclera: Conjunctivae normal.  Cardiovascular:     Rate and Rhythm: Normal rate and regular rhythm.  Pulmonary:     Effort: Pulmonary effort is normal. No respiratory distress.     Breath sounds: No stridor.  Abdominal:     General: There is no distension.  Musculoskeletal:        General: No deformity.     Cervical back: Normal range of motion.  Skin:    General: Skin is warm and dry.     Comments: Please see clinical images.  There is a large contusion over the right thigh.   Neurological:     Mental Status: She is alert.     Motor: No abnormal muscle tone.  Psychiatric:        Behavior: Behavior normal.       ED Results / Procedures / Treatments  Labs (all labs ordered are listed, but only abnormal results are displayed) Labs Reviewed  COMPREHENSIVE METABOLIC PANEL - Abnormal; Notable for the following components:      Result Value   Potassium 3.1 (*)    Glucose, Bld 114 (*)    Calcium 8.5 (*)    Total Bilirubin 0.2 (*)    All other components within normal limits  CBC WITH DIFFERENTIAL/PLATELET  SEDIMENTATION RATE  PROTIME-INR  HCG, QUANTITATIVE, PREGNANCY  CK    EKG None  Radiology US Venous Img Lower Right (DVT Study)  Result Date: 02/05/2022 CLINICAL DATA:  26 year old female with RIGHT LOWER extremity pain and bruising. EXAM: RIGHT LOWER EXTREMITY VENOUS DOPPLER ULTRASOUND TECHNIQUE: Gray-scale sonography with compression, as well as color and duplex ultrasound, were performed to evaluate the deep venous system(s) from the level of the common femoral vein through the popliteal and proximal calf veins. COMPARISON:  None. FINDINGS: VENOUS There is no evidence of DVT within the RIGHT common femoral, superficial  femoral, popliteal veins, or visualized calf veins. Visualized portions of profunda femoral vein and great saphenous vein unremarkable. Limited views of the contralateral common femoral vein are unremarkable. OTHER No focal sonographic abnormalities are noted in the areas of patient's bruising. Limitations: none IMPRESSION: 1. No evidence of RIGHT LOWER extremity DVT. 2. No sonographic abnormalities in the areas of patient's bruising. Electronically Signed   By: Margarette Canada M.D.   On: 02/05/2022 19:16    Procedures Procedures    Medications Ordered in ED Medications - No data to display  ED Course/ Medical Decision Making/ A&P                           Medical Decision Making Amount and/or Complexity of Data Reviewed Labs: ordered. ECG/medicine tests: ordered.   Patient is a 26 year old woman who presents today for evaluation of unexplained bruising.  She denies any trauma.  She does have a possible history of a grandmother with platelet issues however she does not know what this was.  She has previously given birth without abnormal blood loss per report.  While patient was alone without a visitor present I did ask her about any abuse or trauma, she states she feels safe at home ,  no one is hurting her and denies any assault.  On exam she does have a large contusion on her right lateral thigh with multiple other scattered contusions on bilateral arms and legs.  None noted on her back or torso.  CBC is unremarkable, normal platelet count without anemia.  PT/INR is normal.  Pregnancy test is negative.  Sed rate and CK are both normal.  CMP does show mild hypokalemia however I doubt this is clinically significant and I recommended increased dietary potassium and patient states her understanding.  Given unexplained bruising and pain DVT study was obtained without evidence of clot.  Recommended outpatient hematology follow-up due to a possible family history of a unknown platelet issue along  with the patient's apparent very easy bruising.  Return precautions were discussed with patient who states their understanding.  At the time of discharge patient denied any unaddressed complaints or concerns.  Patient is agreeable for discharge home.  Note: Portions of this report may have been transcribed using voice recognition software. Every effort was made to ensure accuracy; however, inadvertent computerized transcription errors may be present Final Clinical Impression(s) / ED Diagnoses Final diagnoses:  Contusion of right thigh, initial encounter  Easy bruising  Rx / DC Orders ED Discharge Orders          Ordered    Ambulatory referral to Hematology / Oncology       Comments: Hematology follow up, unexplained ecchymosis, history of unknown "platelet issue" in grandmother.   02/05/22 2004              Lorin Glass, PA-C 02/05/22 2351    Fredia Sorrow, MD 02/17/22 0730

## 2022-02-05 NOTE — ED Triage Notes (Signed)
Pt arrives with reports of large bruise to posterior right thigh and small bruise to inner right thigh, denies any injury to the area, first noticed yesterday morning. Seen at [facility] and advised to come to ED.

## 2022-02-05 NOTE — Discharge Instructions (Addendum)
Please try to mostly take tylenol.  If you don't hear from hematology/oncology by Wednesday please give their office a call. If your symptoms worsen, you develop fevers, or have any new or concerning symptoms please seek additional medical care and evaluation.  Please take Ibuprofen (Advil, motrin) and Tylenol (acetaminophen) to relieve your pain.    You may take up to 600 MG (3 pills) of normal strength ibuprofen every 8 hours as needed.   You make take tylenol, up to 1,000 mg (two extra strength pills) every 8 hours as needed.   It is safe to take ibuprofen and tylenol at the same time as they work differently.   Do not take more than 3,000 mg tylenol in a 24 hour period (not more than one dose every 8 hours.  Please check all medication labels as many medications such as pain and cold medications may contain tylenol.  Do not drink alcohol while taking these medications.  Do not take other NSAID'S while taking ibuprofen (such as aleve or naproxen).  Please take ibuprofen with food to decrease stomach upset.

## 2022-02-08 NOTE — Telephone Encounter (Signed)
Yes she did!

## 2022-02-08 NOTE — Telephone Encounter (Signed)
It was sent by Dr. Tenny Craw on 17th.  She should be able to pick up.

## 2022-02-14 ENCOUNTER — Other Ambulatory Visit: Payer: Self-pay | Admitting: Family

## 2022-02-14 DIAGNOSIS — D509 Iron deficiency anemia, unspecified: Secondary | ICD-10-CM

## 2022-02-14 DIAGNOSIS — R233 Spontaneous ecchymoses: Secondary | ICD-10-CM

## 2022-02-15 ENCOUNTER — Other Ambulatory Visit: Payer: Self-pay

## 2022-02-15 ENCOUNTER — Inpatient Hospital Stay (HOSPITAL_BASED_OUTPATIENT_CLINIC_OR_DEPARTMENT_OTHER): Payer: Medicaid Other | Admitting: Family

## 2022-02-15 ENCOUNTER — Encounter: Payer: Self-pay | Admitting: Family

## 2022-02-15 ENCOUNTER — Inpatient Hospital Stay: Payer: Medicaid Other | Attending: Hematology & Oncology

## 2022-02-15 VITALS — BP 93/54 | HR 84 | Temp 97.5°F | Resp 18 | Wt 155.0 lb

## 2022-02-15 DIAGNOSIS — Z87891 Personal history of nicotine dependence: Secondary | ICD-10-CM | POA: Diagnosis not present

## 2022-02-15 DIAGNOSIS — R5383 Other fatigue: Secondary | ICD-10-CM | POA: Insufficient documentation

## 2022-02-15 DIAGNOSIS — R531 Weakness: Secondary | ICD-10-CM | POA: Insufficient documentation

## 2022-02-15 DIAGNOSIS — R059 Cough, unspecified: Secondary | ICD-10-CM | POA: Insufficient documentation

## 2022-02-15 DIAGNOSIS — D509 Iron deficiency anemia, unspecified: Secondary | ICD-10-CM

## 2022-02-15 DIAGNOSIS — R233 Spontaneous ecchymoses: Secondary | ICD-10-CM | POA: Diagnosis not present

## 2022-02-15 DIAGNOSIS — R0602 Shortness of breath: Secondary | ICD-10-CM | POA: Insufficient documentation

## 2022-02-15 DIAGNOSIS — Z79899 Other long term (current) drug therapy: Secondary | ICD-10-CM | POA: Diagnosis not present

## 2022-02-15 DIAGNOSIS — R202 Paresthesia of skin: Secondary | ICD-10-CM | POA: Diagnosis not present

## 2022-02-15 DIAGNOSIS — R42 Dizziness and giddiness: Secondary | ICD-10-CM | POA: Diagnosis not present

## 2022-02-15 LAB — CMP (CANCER CENTER ONLY)
ALT: 6 U/L (ref 0–44)
AST: 11 U/L — ABNORMAL LOW (ref 15–41)
Albumin: 3.7 g/dL (ref 3.5–5.0)
Alkaline Phosphatase: 77 U/L (ref 38–126)
Anion gap: 7 (ref 5–15)
BUN: 12 mg/dL (ref 6–20)
CO2: 29 mmol/L (ref 22–32)
Calcium: 8.4 mg/dL — ABNORMAL LOW (ref 8.9–10.3)
Chloride: 105 mmol/L (ref 98–111)
Creatinine: 0.73 mg/dL (ref 0.44–1.00)
GFR, Estimated: >60 mL/min (ref >60)
Glucose, Bld: 67 mg/dL — ABNORMAL LOW (ref 70–99)
Potassium: 3.2 mmol/L — ABNORMAL LOW (ref 3.5–5.1)
Sodium: 141 mmol/L (ref 135–145)
Total Bilirubin: 0.4 mg/dL (ref 0.3–1.2)
Total Protein: 6.5 g/dL (ref 6.5–8.1)

## 2022-02-15 LAB — CBC WITH DIFFERENTIAL (CANCER CENTER ONLY)
Abs Immature Granulocytes: 0.02 10*3/uL (ref 0.00–0.07)
Basophils Absolute: 0 10*3/uL (ref 0.0–0.1)
Basophils Relative: 0 %
Eosinophils Absolute: 0.1 10*3/uL (ref 0.0–0.5)
Eosinophils Relative: 2 %
HCT: 36.5 % (ref 36.0–46.0)
Hemoglobin: 11.7 g/dL — ABNORMAL LOW (ref 12.0–15.0)
Immature Granulocytes: 0 %
Lymphocytes Relative: 18 %
Lymphs Abs: 1.4 10*3/uL (ref 0.7–4.0)
MCH: 29.8 pg (ref 26.0–34.0)
MCHC: 32.1 g/dL (ref 30.0–36.0)
MCV: 93.1 fL (ref 80.0–100.0)
Monocytes Absolute: 0.8 10*3/uL (ref 0.1–1.0)
Monocytes Relative: 11 %
Neutro Abs: 5 10*3/uL (ref 1.7–7.7)
Neutrophils Relative %: 69 %
Platelet Count: 361 10*3/uL (ref 150–400)
RBC: 3.92 MIL/uL (ref 3.87–5.11)
RDW: 12.6 % (ref 11.5–15.5)
WBC Count: 7.4 10*3/uL (ref 4.0–10.5)
nRBC: 0 % (ref 0.0–0.2)

## 2022-02-15 LAB — PROTIME-INR
INR: 1 (ref 0.8–1.2)
Prothrombin Time: 12.9 seconds (ref 11.4–15.2)

## 2022-02-15 LAB — IRON AND IRON BINDING CAPACITY (CC-WL,HP ONLY)
Iron: 43 ug/dL (ref 28–170)
Saturation Ratios: 10 % — ABNORMAL LOW (ref 10.4–31.8)
TIBC: 424 ug/dL (ref 250–450)
UIBC: 381 ug/dL (ref 148–442)

## 2022-02-15 LAB — APTT: aPTT: 27 seconds (ref 24–36)

## 2022-02-15 NOTE — [place] Notes (Signed)
Hematology/Oncology Consultation   Name: Madeline Mccormick      MRN: [id number]    Location: Room/bed info not found  Date: 02/15/2022 Time:11:57 AM   REFERRING PHYSICIAN: Wyn Quaker, PA-C  REASON FOR CONSULT:  Ecchymosis    DIAGNOSIS: Excessive bruising on legs and arms  HISTORY OF PRESENT ILLNESS: [name] is a very pleasant 26 yo caucasian female with history of easy bruising since childhood. However, in the last several weeks this has significantly increased effecting most of both legs and a few spots on her arms.  She denies any trauma or injury. No NSAIDS or blood thinner.  She states that all bruises are unprovoked. No petechiae noted.  She is also symptomatic with fatigue, weakness, SOB with exertion, lightheadedness, being cold natured and tingling in her feet which seems to be positional (sitting for extended periods of time).  She states that her cycle this last month was irregular but that her flow is consistently heavy the first 3 days.  She states that she has bright red blood in her stool sometimes and thinks she has hemorrhoids.  She has never had surgery other than some dental work. She has not had her wisdom teeth removed.  She has one child born in 2020 and states that her only issue was HTN at the end of her 3rd trimester and briefly after.  No known family history of anemia.  No history of diabetes or thyroid disease.  No personal history of cancer. Family history includes maternal grandfather with lung and maternal grandmother with ovarian.  No fever, chills, n/v rash, chest pain, palpitations, abdominal pain or changes in bowel or bladder habits at this time.  No swelling in her extremities.  No falls or syncope.  She stopped smoking after 5 years when she became pregnant. She has been vaping for the last year. She states that she keeps a deep dry cough with this.   She states that recently she has not had much of an appetite and she admits that she needs to better  hydrated throughout the day. Weight today is 155 lbs. She states that she has lost 10 lbs over the last couple months.  She stays busy with her family and also works as a Educational psychologist.   ROS: All other 10 point review of systems is negative.   PAST MEDICAL HISTORY:   Past Medical History:  Diagnosis Date   ADHD    previously took Vivant - stopped 01/2019   Allergy    Anxiety    Depression     ALLERGIES: No Known Allergies    MEDICATIONS:  Current Outpatient Medications on File Prior to Visit  Medication Sig Dispense Refill   ARIPiprazole (ABILIFY) 5 MG tablet Take 1 tablet (5 mg total) by mouth daily. (Patient not taking: Reported on 02/15/2022) 90 tablet 0   escitalopram (LEXAPRO) 20 MG tablet Take 1 tablet (20 mg total) by mouth daily. (Patient not taking: Reported on 02/15/2022) 90 tablet 0   lisdexamfetamine (VYVANSE) 30 MG capsule Take 1 capsule (30 mg total) by mouth daily. 30 capsule 0   metroNIDAZOLE (FLAGYL) 500 MG tablet Take 1 tablet (500 mg total) by mouth 2 (two) times daily. (Patient not taking: Reported on 02/15/2022) 14 tablet 0   No current facility-administered medications on file prior to visit.     PAST SURGICAL HISTORY Past Surgical History:  Procedure Laterality Date   DENTAL SURGERY     THERAPEUTIC ABORTION      FAMILY HISTORY: Family  History  Problem Relation Age of Onset   Arthritis Maternal Grandmother    Stroke Maternal Grandmother    Sudden death Maternal Grandmother    Depression Mother    Diabetes Mother    Thyroid disease Mother     SOCIAL HISTORY:  reports that she quit smoking about 3 years ago. Her smoking use included cigarettes. She smoked an average of .5 packs per day. She has never used smokeless tobacco. She reports that she does not drink alcohol and does not use drugs.  PERFORMANCE STATUS: The patient's performance status is 1 - Symptomatic but completely ambulatory  PHYSICAL EXAM: Most Recent Vital Signs: Blood pressure (!)  93/54, pulse 84, temperature (!) 97.5 F (36.4 C), temperature source Oral, resp. rate 18, weight 155 lb (70.3 kg), SpO2 98 %. BP (!) 93/54 (BP Location: Left Arm, Patient Position: Sitting)    Pulse 84    Temp (!) 97.5 F (36.4 C) (Oral)    Resp 18    Wt 155 lb (70.3 kg)    SpO2 98%    BMI 26.61 kg/m   General Appearance:    Alert, cooperative, no distress, appears stated age  Head:    Normocephalic, without obvious abnormality, atraumatic  Eyes:    PERRL, conjunctiva/corneas clear, EOM's intact, fundi    benign, both eyes        Throat:   Lips, mucosa, and tongue normal; teeth and gums normal  Neck:   Supple, symmetrical, trachea midline, no adenopathy;    thyroid:  no enlargement/tenderness/nodules; no carotid   bruit or JVD  Back:     Symmetric, no curvature, ROM normal, no CVA tenderness  Lungs:     Clear to auscultation bilaterally, respirations unlabored  Chest Wall:    No tenderness or deformity   Heart:    Regular rate and rhythm, S1 and S2 normal, no murmur, rub   or gallop     Abdomen:     Soft, non-tender, bowel sounds active all four quadrants,    no masses, no organomegaly        Extremities:   Extremities normal, atraumatic, no cyanosis or edema  Pulses:   2+ and symmetric all extremities  Skin:   Skin color, texture, turgor normal, no rashes or lesions  Lymph nodes:   Cervical, supraclavicular, and axillary nodes normal  Neurologic:   CNII-XII intact, normal strength, sensation and reflexes    throughout    LABORATORY DATA:  Results for orders placed or performed in visit on 02/15/22 (from the past 48 hour(s))  CMP ([place] only)     Status: Abnormal   Collection Time: 02/15/22 10:49 AM  Result Value Ref Range   Sodium 141 135 - 145 mmol/L   Potassium 3.2 (L) 3.5 - 5.1 mmol/L   Chloride 105 98 - 111 mmol/L   CO2 29 22 - 32 mmol/L   Glucose, Bld 67 (L) 70 - 99 mg/dL    Comment: Glucose reference range applies only to samples taken after fasting for at  least 8 hours.   BUN 12 6 - 20 mg/dL   Creatinine 0.73 0.44 - 1.00 mg/dL   Calcium 8.4 (L) 8.9 - 10.3 mg/dL   Total Protein 6.5 6.5 - 8.1 g/dL   Albumin 3.7 3.5 - 5.0 g/dL   AST 11 (L) 15 - 41 U/L   ALT 6 0 - 44 U/L   Alkaline Phosphatase 77 38 - 126 U/L   Total Bilirubin 0.4 0.3 - 1.2  mg/dL   GFR, Estimated >60 >60 mL/min    Comment: (NOTE) Calculated using the CKD-EPI Creatinine Equation (2021)    Anion gap 7 5 - 15    Comment: Performed at [facility] at [facility], [address] [place], [place] [address]  CBC with Differential ([place] Only)     Status: Abnormal   Collection Time: 02/15/22 10:49 AM  Result Value Ref Range   WBC Count 7.4 4.0 - 10.5 K/uL   RBC 3.92 3.87 - 5.11 MIL/uL   Hemoglobin 11.7 (L) 12.0 - 15.0 g/dL   HCT 36.5 36.0 - 46.0 %   MCV 93.1 80.0 - 100.0 fL   MCH 29.8 26.0 - 34.0 pg   MCHC 32.1 30.0 - 36.0 g/dL   RDW 12.6 11.5 - 15.5 %   Platelet Count 361 150 - 400 K/uL   nRBC 0.0 0.0 - 0.2 %   Neutrophils Relative % 69 %   Neutro Abs 5.0 1.7 - 7.7 K/uL   Lymphocytes Relative 18 %   Lymphs Abs 1.4 0.7 - 4.0 K/uL   Monocytes Relative 11 %   Monocytes Absolute 0.8 0.1 - 1.0 K/uL   Eosinophils Relative 2 %   Eosinophils Absolute 0.1 0.0 - 0.5 K/uL   Basophils Relative 0 %   Basophils Absolute 0.0 0.0 - 0.1 K/uL   Immature Granulocytes 0 %   Abs Immature Granulocytes 0.02 0.00 - 0.07 K/uL    Comment: Performed at [facility] at [facility], [address] [place], [place] [address]  Protime-INR     Status: None   Collection Time: 02/15/22 10:50 AM  Result Value Ref Range   Prothrombin Time 12.9 11.4 - 15.2 seconds   INR 1.0 0.8 - 1.2    Comment: (NOTE) INR goal varies based on device and disease states. Performed at [facility], [address] [place], [place] [address]   APTT     Status: None   Collection Time: 02/15/22 10:50 AM  Result Value Ref Range   aPTT 27 24 -  36 seconds    Comment: Performed at [facility], [address] [place], [place] [address]      RADIOGRAPHY: No results found.     PATHOLOGY: None   ASSESSMENT/PLAN: Ms. Usman is a very pleasant 26 yo caucasian female with history of easy bruising since childhood that has significantly increased effecting most of both legs and a few spots on her arms.  VWB panel and clotting times were normal.  Her iron is low so we will get her set up for IV iron.  Follow-up in 2 months.   All questions were answered. The patient knows to call the clinic with any problems, questions or concerns. We can certainly see the patient much sooner if necessary.  The patient was discussed with Dr. Marin Olp and he is in agreement with the aforementioned.   Lottie Dawson, NP

## 2022-02-16 LAB — FERRITIN: Ferritin: 25 ng/mL (ref 11–307)

## 2022-02-17 LAB — VON WILLEBRAND PANEL
Coagulation Factor VIII: 145 % — ABNORMAL HIGH (ref 56–140)
Ristocetin Co-factor, Plasma: 134 % (ref 50–200)
Von Willebrand Antigen, Plasma: 129 % (ref 50–200)

## 2022-02-17 LAB — COAG STUDIES INTERP REPORT

## 2022-02-18 ENCOUNTER — Telehealth: Payer: Self-pay | Admitting: *Deleted

## 2022-02-18 ENCOUNTER — Encounter: Payer: Self-pay | Admitting: Family

## 2022-02-18 ENCOUNTER — Other Ambulatory Visit: Payer: Self-pay | Admitting: Family

## 2022-02-18 DIAGNOSIS — D509 Iron deficiency anemia, unspecified: Secondary | ICD-10-CM | POA: Insufficient documentation

## 2022-02-18 NOTE — Telephone Encounter (Signed)
Per 02/15/22 los - called and gave upcoming appointments - confirmed ?

## 2022-02-21 ENCOUNTER — Telehealth: Payer: Self-pay | Admitting: Family

## 2022-02-21 NOTE — Telephone Encounter (Signed)
Called to schedule 3 doses of IV iron per 3/3 sch msg , left voicemail  ?

## 2022-02-22 ENCOUNTER — Telehealth (HOSPITAL_COMMUNITY): Payer: Self-pay | Admitting: *Deleted

## 2022-02-22 NOTE — Telephone Encounter (Signed)
I'm no longer takng adults as new pts AND we try not to see 2 people in the same family. His treatment plan looks very sound, she needs to get the iron infusions as recommended by oncology, that should help her energy

## 2022-02-22 NOTE — Telephone Encounter (Signed)
Yes. Thanks. Spoke with mother who verbalizes understanding.

## 2022-02-22 NOTE — Telephone Encounter (Signed)
This pt currently sees Dr. Adele Schilder however mother, Kenney Houseman who is seen by you, left a message requesting that you take Madeline Mccormick as a new pt. Please review. ?

## 2022-02-23 ENCOUNTER — Telehealth: Payer: Self-pay | Admitting: Family

## 2022-02-23 NOTE — Telephone Encounter (Signed)
Called to schedule 3 doses of iron per 3/3 sch msg, left voicemail  ?

## 2022-02-24 ENCOUNTER — Telehealth: Payer: Self-pay | Admitting: *Deleted

## 2022-02-24 NOTE — Telephone Encounter (Signed)
Per scheduling message - returned phone call to patient and lvm for call back to schedule (3) doses of IV Iron ?

## 2022-03-10 ENCOUNTER — Inpatient Hospital Stay: Payer: Medicaid Other | Attending: Hematology & Oncology

## 2022-03-10 ENCOUNTER — Other Ambulatory Visit: Payer: Self-pay

## 2022-03-10 VITALS — BP 107/56 | HR 76 | Temp 97.8°F | Resp 18

## 2022-03-10 DIAGNOSIS — Z79899 Other long term (current) drug therapy: Secondary | ICD-10-CM | POA: Diagnosis not present

## 2022-03-10 DIAGNOSIS — D649 Anemia, unspecified: Secondary | ICD-10-CM | POA: Insufficient documentation

## 2022-03-10 DIAGNOSIS — D509 Iron deficiency anemia, unspecified: Secondary | ICD-10-CM

## 2022-03-10 DIAGNOSIS — R233 Spontaneous ecchymoses: Secondary | ICD-10-CM | POA: Diagnosis present

## 2022-03-10 MED ORDER — SODIUM CHLORIDE 0.9% FLUSH: 3.0000 mL | Freq: Once | INTRAVENOUS | Status: DC | PRN

## 2022-03-10 MED ORDER — SODIUM CHLORIDE 0.9% FLUSH: 10.0000 mL | Freq: Once | INTRAVENOUS | Status: DC | PRN

## 2022-03-10 MED ORDER — SODIUM CHLORIDE 0.9 % IV SOLN
300.0000 mg | Freq: Once | INTRAVENOUS | Status: AC
  Administered 2022-03-10: 300 mg via INTRAVENOUS
  Filled 2022-03-10: qty 300

## 2022-03-10 MED ORDER — SODIUM CHLORIDE 0.9 % IV SOLN: Freq: Once | INTRAVENOUS | Status: AC

## 2022-03-10 NOTE — Patient Instructions (Signed)

## 2022-03-16 ENCOUNTER — Telehealth (HOSPITAL_COMMUNITY): Payer: Self-pay | Admitting: *Deleted

## 2022-03-16 DIAGNOSIS — F902 Attention-deficit hyperactivity disorder, combined type: Secondary | ICD-10-CM

## 2022-03-16 MED ORDER — LISDEXAMFETAMINE DIMESYLATE 30 MG PO CAPS: 30.0000 mg | ORAL_CAPSULE | Freq: Every day | ORAL | 0 refills | Status: DC

## 2022-03-16 NOTE — Telephone Encounter (Signed)
Pt called requesting a refill of Vyvanse 30 mg. Pt has an upcoming appointment on 03/23/22. Please review. ?

## 2022-03-16 NOTE — Telephone Encounter (Signed)
Done

## 2022-03-17 ENCOUNTER — Inpatient Hospital Stay: Payer: Medicaid Other

## 2022-03-17 VITALS — BP 88/55 | HR 72 | Temp 98.2°F | Resp 17

## 2022-03-17 DIAGNOSIS — D649 Anemia, unspecified: Secondary | ICD-10-CM | POA: Diagnosis not present

## 2022-03-17 DIAGNOSIS — D509 Iron deficiency anemia, unspecified: Secondary | ICD-10-CM

## 2022-03-17 MED ORDER — SODIUM CHLORIDE 0.9 % IV SOLN: Freq: Once | INTRAVENOUS | Status: AC

## 2022-03-17 MED ORDER — SODIUM CHLORIDE 0.9 % IV SOLN
300.0000 mg | Freq: Once | INTRAVENOUS | Status: AC
  Administered 2022-03-17: 300 mg via INTRAVENOUS
  Filled 2022-03-17: qty 300

## 2022-03-17 NOTE — Patient Instructions (Signed)

## 2022-03-23 ENCOUNTER — Telehealth (HOSPITAL_COMMUNITY): Payer: Medicaid Other | Admitting: Psychiatry

## 2022-03-24 ENCOUNTER — Inpatient Hospital Stay: Payer: Medicaid Other | Attending: Hematology & Oncology

## 2022-03-24 VITALS — BP 92/61 | HR 54 | Temp 97.5°F | Resp 17

## 2022-03-24 DIAGNOSIS — D649 Anemia, unspecified: Secondary | ICD-10-CM | POA: Insufficient documentation

## 2022-03-24 DIAGNOSIS — R233 Spontaneous ecchymoses: Secondary | ICD-10-CM | POA: Diagnosis present

## 2022-03-24 DIAGNOSIS — D509 Iron deficiency anemia, unspecified: Secondary | ICD-10-CM

## 2022-03-24 MED ORDER — SODIUM CHLORIDE 0.9 % IV SOLN: Freq: Once | INTRAVENOUS | Status: AC

## 2022-03-24 MED ORDER — SODIUM CHLORIDE 0.9 % IV SOLN
300.0000 mg | Freq: Once | INTRAVENOUS | Status: AC
  Administered 2022-03-24: 300 mg via INTRAVENOUS
  Filled 2022-03-24: qty 300

## 2022-03-24 NOTE — Patient Instructions (Signed)

## 2022-03-30 ENCOUNTER — Telehealth: Payer: Self-pay | Admitting: Family

## 2022-03-30 ENCOUNTER — Telehealth: Payer: Self-pay

## 2022-03-30 ENCOUNTER — Telehealth: Payer: Self-pay | Admitting: Family Medicine

## 2022-03-30 NOTE — Telephone Encounter (Signed)
Patient has a couple of questions that she hopes a nurse can answer in order to set up an appt with [place].  ?

## 2022-03-30 NOTE — Telephone Encounter (Signed)
Pt called and left voicemail requesting a sooner appointment with Sarah , called patient back and LVM for patient to call us back to reschedule a sooner appointment  ?

## 2022-03-30 NOTE — Telephone Encounter (Signed)
Patient called front office stating she had some questions for the nursing staff before she wanted to make an appointment. I called patient back to see what questions she had. Per patient starting in January she began having bruising in her legs in which her PCP referred her to hematology and oncology. Patient states they told her she had low iron so she received 3 iron infusions. Per patient her periods have become irregular ever since she received the iron infusions. Per patient she had always had a regular cycle but now her periods are irregular and last for 2 weeks. Patient also states she has abdominal pain that comes and goes. Patient states she is also concerned because she is losing weight. Patient asked if it would be appropriate for her to make an appointment with the clinic for her irregular periods. I told the patient yes it would be appropriate for her to see a provider about her irregular periods. Patient verbalized understanding.  ? ?Alesia Richards, RN ?03/30/22 ?

## 2022-04-04 ENCOUNTER — Inpatient Hospital Stay: Payer: Medicaid Other

## 2022-04-04 ENCOUNTER — Inpatient Hospital Stay (HOSPITAL_BASED_OUTPATIENT_CLINIC_OR_DEPARTMENT_OTHER): Payer: Medicaid Other | Admitting: Family

## 2022-04-04 ENCOUNTER — Encounter: Payer: Self-pay | Admitting: Family

## 2022-04-04 VITALS — BP 115/80 | HR 90 | Temp 98.2°F | Resp 16 | Wt 149.0 lb

## 2022-04-04 DIAGNOSIS — D649 Anemia, unspecified: Secondary | ICD-10-CM | POA: Diagnosis not present

## 2022-04-04 DIAGNOSIS — R233 Spontaneous ecchymoses: Secondary | ICD-10-CM

## 2022-04-04 DIAGNOSIS — D509 Iron deficiency anemia, unspecified: Secondary | ICD-10-CM | POA: Diagnosis not present

## 2022-04-04 DIAGNOSIS — E538 Deficiency of other specified B group vitamins: Secondary | ICD-10-CM

## 2022-04-04 LAB — CBC WITH DIFFERENTIAL (CANCER CENTER ONLY)
Abs Immature Granulocytes: 0.07 10*3/uL (ref 0.00–0.07)
Basophils Absolute: 0 10*3/uL (ref 0.0–0.1)
Basophils Relative: 0 %
Eosinophils Absolute: 0.1 10*3/uL (ref 0.0–0.5)
Eosinophils Relative: 2 %
HCT: 40.2 % (ref 36.0–46.0)
Hemoglobin: 13.1 g/dL (ref 12.0–15.0)
Immature Granulocytes: 1 %
Lymphocytes Relative: 22 %
Lymphs Abs: 1.2 10*3/uL (ref 0.7–4.0)
MCH: 29.8 pg (ref 26.0–34.0)
MCHC: 32.6 g/dL (ref 30.0–36.0)
MCV: 91.6 fL (ref 80.0–100.0)
Monocytes Absolute: 0.5 10*3/uL (ref 0.1–1.0)
Monocytes Relative: 9 %
Neutro Abs: 3.5 10*3/uL (ref 1.7–7.7)
Neutrophils Relative %: 66 %
Platelet Count: 297 10*3/uL (ref 150–400)
RBC: 4.39 MIL/uL (ref 3.87–5.11)
RDW: 13.7 % (ref 11.5–15.5)
WBC Count: 5.4 10*3/uL (ref 4.0–10.5)
nRBC: 0 % (ref 0.0–0.2)

## 2022-04-04 LAB — RETICULOCYTES
Immature Retic Fract: 2.5 % (ref 2.3–15.9)
RBC.: 4.36 MIL/uL (ref 3.87–5.11)
Retic Count, Absolute: 24.4 10*3/uL (ref 19.0–186.0)
Retic Ct Pct: 0.6 % (ref 0.4–3.1)

## 2022-04-04 NOTE — [facility] Notes (Signed)
?Hematology and Oncology Follow Up Visit ? ?Madeline Mccormick ?[id number] ?1996-08-11 26 y.o. ?04/04/2022 ? ? ?Principle Diagnosis:  ?Iron deficiency ?Bruising  ? ?Current Therapy:   ?IV iron as indicated  ?  ?Interim History:  Madeline Mccormick is here today for follow-up. She is doing fairly well but states that recently she has been having headaches as well as indigestion and diarrhea vs constipation.  ?She has been taking Tums and Pepto bismol as needed which helps some.  ?Her cycles has been heavy and lasted a little longer last month.  ?She has also noted that her gums will bleed when she brushes her teeth. No rectal blood loss noted.  ?She states that the bruising on her legs seems to be lessening.  ?No petechiae.  ?No fever, chills, n/v, cough, rash, SOB, chest pain, palpitations or changes in bladder habits.  ?No swelling, numbness or tingling in her extremities at this time.  ?She has occasional episodes of dizziness and states that she has not had much of an appetite. She is hydrating throughout the day. Her weight is down 6 lbs to 149 since we last caw her.  ?No hot flashes or night sweats.  ? ?ECOG Performance Status: 1 - Symptomatic but completely ambulatory ? ?Medications:  ?Allergies as of 04/04/2022   ?No Known Allergies ?  ? ?  ?Medication List  ?  ? ?  ? Accurate as of April 04, 2022  1:32 PM. If you have any questions, ask your nurse or doctor.  ?  ?  ? ?  ? ?ARIPiprazole 5 MG tablet ?Commonly known as: Abilify ?Take 1 tablet (5 mg total) by mouth daily. ?  ?escitalopram 20 MG tablet ?Commonly known as: LEXAPRO ?Take 1 tablet (20 mg total) by mouth daily. ?  ?lisdexamfetamine 30 MG capsule ?Commonly known as: Vyvanse ?Take 1 capsule (30 mg total) by mouth daily. ?  ?metroNIDAZOLE 500 MG tablet ?Commonly known as: FLAGYL ?Take 1 tablet (500 mg total) by mouth 2 (two) times daily. ?  ? ?  ? ? ?Allergies: No Known Allergies ? ?Past Medical History, Surgical history, Social history, and Family History were reviewed  and updated. ? ?Review of Systems: ?All other 10 point review of systems is negative.  ? ?Physical Exam: ? vitals were not taken for this visit.  ? ?Wt Readings from Last 3 Encounters:  ?02/15/22 155 lb (70.3 kg)  ?02/05/22 155 lb (70.3 kg)  ?10/01/20 145 lb 11.2 oz (66.1 kg)  ? ? ?Ocular: Sclerae unicteric, pupils equal, round and reactive to light ?Ear-nose-throat: Oropharynx clear, dentition fair ?Lymphatic: No cervical or supraclavicular adenopathy ?Lungs no rales or rhonchi, good excursion bilaterally ?Heart regular rate and rhythm, no murmur appreciated ?Abd soft, nontender, positive bowel sounds ?MSK no focal spinal tenderness, no joint edema ?Neuro: non-focal, well-oriented, appropriate affect ?Breasts: Deferred  ? ?Lab Results  ?Component Value Date  ? WBC 7.4 02/15/2022  ? HGB 11.7 (L) 02/15/2022  ? HCT 36.5 02/15/2022  ? MCV 93.1 02/15/2022  ? PLT 361 02/15/2022  ? ?Lab Results  ?Component Value Date  ? FERRITIN 25 02/15/2022  ? IRON 43 02/15/2022  ? TIBC 424 02/15/2022  ? UIBC 381 02/15/2022  ? IRONPCTSAT 10 (L) 02/15/2022  ? ?Lab Results  ?Component Value Date  ? RBC 3.92 02/15/2022  ? ?No results found for: KPAFRELGTCHN, LAMBDASER, KAPLAMBRATIO ?No results found for: IGGSERUM, IGA, IGMSERUM ?No results found for: TOTALPROTELP, ALBUMINELP, A1GS, A2GS, BETS, BETA2SER, GAMS, MSPIKE, SPEI ?  Chemistry   ?   ?  Component Value Date/Time  ? NA 141 02/15/2022 1049  ? K 3.2 (L) 02/15/2022 1049  ? CL 105 02/15/2022 1049  ? CO2 29 02/15/2022 1049  ? BUN 12 02/15/2022 1049  ? CREATININE 0.73 02/15/2022 1049  ?    ?Component Value Date/Time  ? CALCIUM 8.4 (L) 02/15/2022 1049  ? ALKPHOS 77 02/15/2022 1049  ? AST 11 (L) 02/15/2022 1049  ? ALT 6 02/15/2022 1049  ? BILITOT 0.4 02/15/2022 1049  ?  ? ? ? ?Impression and Plan: Madeline Mccormick is a very pleasant 26 yo caucasian female with history of easy bruising since childhood that has significantly increased effecting most of both legs and a few spots on her arms.  ?VWB panel  and clotting times were normal.  ?She was found to have iron deficiency.  ?Iron studies are pending.  ?We will also have her start taking vitamin C PO daily and see if this helps reduce the bleeding gums.  ?She plans to follow-up with her PCP regarding the GI issues.  ?Follow-up in 3 months.  ? ?Lottie Dawson, NP ?4/17/20231:32 PM ? ?

## 2022-04-05 LAB — IRON AND IRON BINDING CAPACITY (CC-WL,HP ONLY)
Iron: 133 ug/dL (ref 28–170)
Saturation Ratios: 41 % — ABNORMAL HIGH (ref 10.4–31.8)
TIBC: 326 ug/dL (ref 250–450)
UIBC: 193 ug/dL (ref 148–442)

## 2022-04-05 LAB — FERRITIN: Ferritin: 213 ng/mL (ref 11–307)

## 2022-04-15 ENCOUNTER — Inpatient Hospital Stay: Payer: Medicaid Other | Admitting: Family

## 2022-04-15 ENCOUNTER — Inpatient Hospital Stay: Payer: Medicaid Other

## 2022-05-04 ENCOUNTER — Telehealth (HOSPITAL_COMMUNITY): Payer: Medicaid Other | Admitting: Psychiatry

## 2022-05-05 ENCOUNTER — Encounter (HOSPITAL_COMMUNITY): Payer: Self-pay | Admitting: Psychiatry

## 2022-05-05 ENCOUNTER — Telehealth (HOSPITAL_BASED_OUTPATIENT_CLINIC_OR_DEPARTMENT_OTHER): Payer: Medicaid Other | Admitting: Psychiatry

## 2022-05-05 DIAGNOSIS — F331 Major depressive disorder, recurrent, moderate: Secondary | ICD-10-CM | POA: Diagnosis not present

## 2022-05-05 DIAGNOSIS — F902 Attention-deficit hyperactivity disorder, combined type: Secondary | ICD-10-CM | POA: Diagnosis not present

## 2022-05-05 DIAGNOSIS — F419 Anxiety disorder, unspecified: Secondary | ICD-10-CM | POA: Diagnosis not present

## 2022-05-05 MED ORDER — ESCITALOPRAM OXALATE 20 MG PO TABS: 20.0000 mg | ORAL_TABLET | Freq: Every day | ORAL | 0 refills | Status: DC

## 2022-05-05 MED ORDER — LISDEXAMFETAMINE DIMESYLATE 30 MG PO CAPS: 30.0000 mg | ORAL_CAPSULE | Freq: Every day | ORAL | 0 refills | Status: DC

## 2022-05-05 MED ORDER — ARIPIPRAZOLE 5 MG PO TABS: 5.0000 mg | ORAL_TABLET | Freq: Every day | ORAL | 0 refills | Status: DC

## 2022-05-05 NOTE — Progress Notes (Signed)
Virtual Visit via Telephone Note  I connected with Madeline Mccormick on 05/05/22 at 11:00 AM EDT by telephone and verified that I am speaking with the correct person using two identifiers.  Location: Patient: In Car Provider: Home Office   I discussed the limitations, risks, security and privacy concerns of performing an evaluation and management service by telephone and the availability of in person appointments. I also discussed with the patient that there may be a patient responsible charge related to this service. The patient expressed understanding and agreed to proceed.   History of Present Illness: Patient is evaluated by phone session.  She is outside and taking her children father to the doctor's appointment.  She feels things are going well but she is out of her medication for the past 2 weeks.  She admitted sometime forgetful and decreased attention and concentration.  She also have fatigue, feeling tired and recently lost a lot of weight.  She has low iron and now getting iron infusion.  She feels some improvement after iron infusion but now scheduled to see GI.  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  She is in therapy with Morrie Sheldon with her children's father.  She is sleeping okay.  Other than feeling tired and fatigued she reported her mood is okay.  She really liked the combination of medicine which is helping her depression, anxiety.  She denies any suicidal thoughts or any hallucination.  She has no tremor or shakes or any EPS.  Her grades are good.  She is hoping to graduate next year.  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  Patient denies drinking or using any illegal substances.    Past Psychiatric History: H/O ADHD diagnosed by PCP but no formal psychological testing was done. Took Vyvanse in 2014 until d/c in February 2020 after got pregnant.  Taking Lexapro since 2018. Tried BuSpar and Lamictal. Wellbutrin did not work. No h/o suicidal attempt, inpatient treatment, hallucination,  OCD, PTSD or panic attacks.  Psychiatric Specialty Exam: Physical Exam  Review of Systems  Weight 149 lb (67.6 kg).There is no height or weight on file to calculate BMI.  General Appearance: NA  Eye Contact:  NA  Speech:  Clear and Coherent and Normal Rate  Volume:  Normal  Mood:  Euthymic  Affect:  NA  Thought Process:  Goal Directed  Orientation:  Full (Time, Place, and Person)  Thought Content:  Logical  Suicidal Thoughts:  No  Homicidal Thoughts:  No  Memory:  Immediate;   Good Recent;   Good Remote;   Good  Judgement:  Intact  Insight:  Present  Psychomotor Activity:  NA  Concentration:  Concentration: Fair and Attention Span: Fair  Recall:  Good  Fund of Knowledge:  Good  Language:  Good  Akathisia:  No  Handed:  Right  AIMS (if indicated):     Assets:  Communication Skills Desire for Improvement Housing Social Support Transportation  ADL's:  Intact  Cognition:  WNL  Sleep:   ok      Assessment and Plan: Major depressive disorder, recurrent.  Anxiety.  ADHD, combined type.  Reviewed blood work results.  Patient is receiving iron infusion which is helping her fatigue, tiredness.  She is also scheduled to see GI.  We talk about medication.  She feels things are going well and she is able to keep her symptoms manageable.  She does not want to change the medication.  We will continue Lexapro 20 mg daily, Vyvanse 30 mg  in the morning and Abilify 5 mg daily.  Recommended to call us back if she has any question or any concern.  Patient has lost weight because of low iron level which is slowly and gradually getting better but hoping to have a visit with a GI very soon.  Follow-up in 3 months.  Follow Up Instructions:    I discussed the assessment and treatment plan with the patient. The patient was provided an opportunity to ask questions and all were answered. The patient agreed with the plan and demonstrated an understanding of the instructions.   The patient was  advised to call back or seek an in-person evaluation if the symptoms worsen or if the condition fails to improve as anticipated.  Collaboration of Care: Primary Care Provider AEB notes are available in epic to review.  Patient/Guardian was advised Release of Information must be obtained prior to any record release in order to collaborate their care with an outside provider. Patient/Guardian was advised if they have not already done so to contact the registration department to sign all necessary forms in order for [place] to release information regarding their care.   Consent: Patient/Guardian gives verbal consent for treatment and assignment of benefits for services provided during this visit. Patient/Guardian expressed understanding and agreed to proceed.    I provided 20 minutes of non-face-to-face time during this encounter.   Cleotis Nipper, MD

## 2022-05-24 ENCOUNTER — Encounter: Payer: Self-pay | Admitting: Internal Medicine

## 2022-06-07 ENCOUNTER — Encounter: Payer: Self-pay | Admitting: Family

## 2022-06-15 ENCOUNTER — Telehealth (HOSPITAL_COMMUNITY): Payer: Self-pay | Admitting: *Deleted

## 2022-06-15 DIAGNOSIS — F902 Attention-deficit hyperactivity disorder, combined type: Secondary | ICD-10-CM

## 2022-06-15 MED ORDER — LISDEXAMFETAMINE DIMESYLATE 30 MG PO CAPS: 30.0000 mg | ORAL_CAPSULE | Freq: Every day | ORAL | 0 refills | Status: DC

## 2022-06-15 NOTE — Telephone Encounter (Signed)
Pt called requesting refill of Vyvanse 30mg . #30 last sent in on 05/05/22. Pt next scheduled appointment is on 08/03/22. Please review.

## 2022-06-15 NOTE — Telephone Encounter (Signed)
Done

## 2022-06-16 ENCOUNTER — Ambulatory Visit (INDEPENDENT_AMBULATORY_CARE_PROVIDER_SITE_OTHER): Payer: Medicaid Other | Admitting: Obstetrics and Gynecology

## 2022-06-16 ENCOUNTER — Other Ambulatory Visit (HOSPITAL_COMMUNITY)
Admission: RE | Admit: 2022-06-16 | Discharge: 2022-06-16 | Disposition: A | Discharge status: Home / Self Care | Payer: Medicaid Other | Source: Ambulatory Visit | Attending: Obstetrics and Gynecology | Admitting: Obstetrics and Gynecology

## 2022-06-16 ENCOUNTER — Encounter: Payer: Self-pay | Admitting: Family

## 2022-06-16 ENCOUNTER — Encounter: Payer: Self-pay | Admitting: Obstetrics and Gynecology

## 2022-06-16 VITALS — BP 117/78 | HR 98 | Ht 64.0 in | Wt 140.0 lb

## 2022-06-16 DIAGNOSIS — Z113 Encounter for screening for infections with a predominantly sexual mode of transmission: Secondary | ICD-10-CM | POA: Diagnosis not present

## 2022-06-16 DIAGNOSIS — Z1151 Encounter for screening for human papillomavirus (HPV): Secondary | ICD-10-CM | POA: Diagnosis present

## 2022-06-16 NOTE — Progress Notes (Signed)
Patient has elevated PHQ9. Declines BHC, states she is already seeing someone.   Alesia Richards, RN 06/16/22

## 2022-06-16 NOTE — Progress Notes (Signed)
Obstetrics and Gynecology New Patient Evaluation  Appointment Date: 06/16/2022  OBGYN Clinic: [facility]  Primary Care Provider: [facility]   Chief Complaint: AUB, IUD fell out yesterday  History of Present Illness: [place] Yin is a 26 y.o. [phone] (Patient's last menstrual period was 06/12/2022 (exact date).), seen for the above chief complaint. She has a history of HTN, iron deficiency anemia, depression, unintentional weight loss and pre-diabetes.   Ms. Tessler has complaints of light bleeding and discomfort today after accidentally pulling out her IUD (paragard) yesterday. She did bring the IUD to the visit, it was inspected and intact. She has a history of heavy menstrual bleeding, LMP began 4 days ago and ended yesterday, bleeding has reduced after removal of the IUD. She did not want another IUD placed at this time but was interested in other methods of contraception and is planning on pregnancy in 5 years. Prior to this, her periods were qmonth, regular but somewhat heavy but no AUB until recently; she requests a PAP smear today and would like screening for STIs  Review of Systems: Pertinent items noted in HPI and remainder of comprehensive ROS otherwise negative.     Patient Active Problem List   Diagnosis Date Noted   Iron deficiency anemia 02/18/2022   Presence of IUD 12/17/2019   Essential hypertension 12/17/2019   History of pre-eclampsia 10/21/2019   Prediabetes 11/18/2014   Depression, controlled 05/04/2014   Trochanteric bursitis of right hip 04/30/2014   Situational anxiety 10/08/2013     Past Medical History:  Past Medical History:  Diagnosis Date   ADHD    previously took Vivant - stopped 01/2019   Allergy    Anxiety    Depression     Past Surgical History:  Past Surgical History:  Procedure Laterality Date   DENTAL SURGERY     THERAPEUTIC ABORTION      Past Obstetrical History:  OB History   Gravida Para Term Preterm AB Living  3 1 1   2 1   SAB IAB Ectopic Multiple Live Births  1 1   0 1    # Outcome Date GA Lbr Len/2nd Weight Sex Delivery Anes PTL Lv  3 Term 10/16/19 [redacted]w[redacted]d 25:31 / 01:36 6 lb 14.2 oz (3.125 kg) M Vag-Spont EPI  LIV  2 SAB 2019          1 IAB 2012            Past Gynecological History: As per HPI. Pap neg 04/2019.   Social History:  Social History   Socioeconomic History   Marital status: Single    Spouse name: Not on file   Number of children: Not on file   Years of education: Not on file   Highest education level: Not on file  Occupational History    Comment: unemployed  Tobacco Use   Smoking status: Every Day    Types: E-cigarettes   Smokeless tobacco: Never   Tobacco comments:    Vapes  Vaping Use   Vaping Use: Every day  Substance and Sexual Activity   Alcohol use: No    Alcohol/week: 0.0 standard drinks of alcohol   Drug use: No   Sexual activity: Yes    Birth control/protection: None  Other Topics Concern   Not on file  Social History Narrative   Not on file   Social Determinants of Health   Financial Resource Strain: Not on file  Food Insecurity: No Food Insecurity (06/16/2022)  Hunger Vital Sign    Worried About Running Out of Food in the Last Year: Never true    Ran Out of Food in the Last Year: Never true  Transportation Needs: No Transportation Needs (06/16/2022)   PRAPARE - Administrator, Civil Service (Medical): No    Lack of Transportation (Non-Medical): No  Physical Activity: Not on file  Stress: Not on file  Social Connections: Not on file  Intimate Partner Violence: Not At Risk (04/09/2019)   Humiliation, Afraid, Rape, and Kick questionnaire    Fear of Current or Ex-Partner: No    Emotionally Abused: No    Physically Abused: No    Sexually Abused: No    Family History:  Family History  Problem Relation Age of Onset   Arthritis Maternal Grandmother    Stroke Maternal Grandmother    Sudden  death Maternal Grandmother    Depression Mother    Diabetes Mother    Thyroid disease Mother     Medications [place] Bushard had no medications administered during this visit. Current Outpatient Medications  Medication Sig Dispense Refill   ARIPiprazole (ABILIFY) 5 MG tablet Take 1 tablet (5 mg total) by mouth daily. 90 tablet 0   escitalopram (LEXAPRO) 20 MG tablet Take 1 tablet (20 mg total) by mouth daily. 90 tablet 0   lisdexamfetamine (VYVANSE) 30 MG capsule Take 1 capsule (30 mg total) by mouth daily. 30 capsule 0   No current facility-administered medications for this visit.   Facility-Administered Medications Ordered in Other Visits  Medication Dose Route Frequency Provider Last Rate Last Admin   sodium chloride flush (NS) 0.9 % injection 10 mL  10 mL Intracatheter Once PRN Erenest Blank, NP       sodium chloride flush (NS) 0.9 % injection 3 mL  3 mL Intracatheter Once PRN Erenest Blank, NP        Allergies Patient has no known allergies.   Physical Exam:  BP 117/78   Pulse 98   Ht 5\' 4"  (1.626 m)   Wt 140 lb (63.5 kg)   LMP 06/12/2022 (Exact Date)   BMI 24.03 kg/m  Body mass index is 24.03 kg/m.  General appearance: Well nourished, well developed female in no acute distress.  Neck:  Supple, normal appearance, and no thyromegaly  Cardiovascular: normal s1 and s2.  No murmurs, rubs or gallops. Respiratory:  Clear to auscultation bilateral. Normal respiratory effort Abdomen: positive bowel sounds and no masses, hernias; diffusely non tender to palpation, non distended Neuro/Psych:  Normal mood and affect.  Skin:  Warm and dry.  Lymphatic:  No inguinal lymphadenopathy.   Pelvic exam: is not limited by body habitus EGBUS: within normal limits Vagina: within normal limits and with no blood or discharge in the vault Cervix: normal appearing cervix without tenderness, discharge or lesions. Uterus:  nonenlarged and non tender Adnexa:  normal adnexa and no mass,  fullness, tenderness Rectovaginal: deferred  IUD inspected and it is a paragard and intact with strings attached.   Laboratory: none  Radiology: none  Assessment: patient stable  Plan:  1. Screening for HPV (human papillomavirus)  - Cytology - PAP( [place])  2. Screening for STD (sexually transmitted disease) - RPR+HBsAg+HCVAb+...  3. GYN Birth control options d/w her. She had PP HTN and was on meds for sometime (so ?CHTN on meds) but is currently normotensive on no meds.   RTC PRN  06/14/2022 MD Attending Center for [facility] [employer])

## 2022-06-17 LAB — RPR+HBSAG+HCVAB+...
HIV Screen 4th Generation wRfx: NONREACTIVE
Hep C Virus Ab: NONREACTIVE
Hepatitis B Surface Ag: NEGATIVE
RPR Ser Ql: NONREACTIVE

## 2022-06-18 ENCOUNTER — Encounter: Payer: Self-pay | Admitting: Obstetrics and Gynecology

## 2022-06-22 ENCOUNTER — Ambulatory Visit: Payer: Medicaid Other | Admitting: Gastroenterology

## 2022-06-22 LAB — CYTOLOGY - PAP
Chlamydia: NEGATIVE
Comment: NEGATIVE
Comment: NEGATIVE
Comment: NORMAL
Neisseria Gonorrhea: NEGATIVE
Trichomonas: NEGATIVE

## 2022-06-22 NOTE — Progress Notes (Deleted)
GI Office Note    Referring Provider: [facility] Primary Care Physician:  [facility]    Chief Complaint   No chief complaint on file.    History of Present Illness   Madeline Mccormick is a 26 y.o. female presenting today at the request of [facility] for constipation.   Follows with heme-onc for IDA     Past Medical History:  Diagnosis Date   ADHD    previously took Vivant - stopped 01/2019   Allergy    Anxiety    Depression    History of pre-eclampsia 10/21/2019    Past Surgical History:  Procedure Laterality Date   DENTAL SURGERY     THERAPEUTIC ABORTION      Current Outpatient Medications  Medication Sig Dispense Refill   ARIPiprazole (ABILIFY) 5 MG tablet Take 1 tablet (5 mg total) by mouth daily. 90 tablet 0   escitalopram (LEXAPRO) 20 MG tablet Take 1 tablet (20 mg total) by mouth daily. 90 tablet 0   lisdexamfetamine (VYVANSE) 30 MG capsule Take 1 capsule (30 mg total) by mouth daily. 30 capsule 0   No current facility-administered medications for this visit.   Facility-Administered Medications Ordered in Other Visits  Medication Dose Route Frequency Provider Last Rate Last Admin   sodium chloride flush (NS) 0.9 % injection 10 mL  10 mL Intracatheter Once PRN Erenest Blank, NP       sodium chloride flush (NS) 0.9 % injection 3 mL  3 mL Intracatheter Once PRN Erenest Blank, NP        Allergies as of 06/22/2022   (No Known Allergies)    Family History  Problem Relation Age of Onset   Arthritis Maternal Grandmother    Stroke Maternal Grandmother    Sudden death Maternal Grandmother    Depression Mother    Diabetes Mother    Thyroid disease Mother     Social History   Socioeconomic History   Marital status: Single    Spouse name: Not on file   Number of children: Not on file   Years of education: Not on file   Highest education level: Not on file  Occupational History    Comment: unemployed   Tobacco Use   Smoking status: Every Day    Types: E-cigarettes   Smokeless tobacco: Never   Tobacco comments:    Vapes  Vaping Use   Vaping Use: Every day  Substance and Sexual Activity   Alcohol use: No    Alcohol/week: 0.0 standard drinks of alcohol   Drug use: No   Sexual activity: Yes    Birth control/protection: None  Other Topics Concern   Not on file  Social History Narrative   Not on file   Social Determinants of Health   Financial Resource Strain: Not on file  Food Insecurity: No Food Insecurity (06/16/2022)   Hunger Vital Sign    Worried About Running Out of Food in the Last Year: Never true    Ran Out of Food in the Last Year: Never true  Transportation Needs: No Transportation Needs (06/16/2022)   PRAPARE - Administrator, Civil Service (Medical): No    Lack of Transportation (Non-Medical): No  Physical Activity: Not on file  Stress: Not on file  Social Connections: Not on file  Intimate Partner Violence: Not At Risk (04/09/2019)   Humiliation, Afraid, Rape, and Kick questionnaire    Fear of Current or Ex-Partner: No  Emotionally Abused: No    Physically Abused: No    Sexually Abused: No     Review of Systems   Gen: Denies any fever, chills, fatigue, weight loss, lack of appetite.  CV: Denies chest pain, heart palpitations, peripheral edema, syncope.  Resp: Denies shortness of breath at rest or with exertion. Denies wheezing or cough.  GI: Denies dysphagia or odynophagia. Denies jaundice, hematemesis, fecal incontinence. GU : Denies urinary burning, urinary frequency, urinary hesitancy MS: Denies joint pain, muscle weakness, cramps, or limitation of movement.  Derm: Denies rash, itching, dry skin Psych: Denies depression, anxiety, memory loss, and confusion Heme: Denies bruising, bleeding, and enlarged lymph nodes.   Physical Exam   LMP 06/12/2022 (Exact Date)  General:   Alert and oriented. Pleasant and cooperative. Well-nourished and  well-developed.  Head:  Normocephalic and atraumatic. Eyes:  Without icterus, sclera clear and conjunctiva pink.  Ears:  Normal auditory acuity. Mouth:  No deformity or lesions, oral mucosa pink.  Lungs:  Clear to auscultation bilaterally. No wheezes, rales, or rhonchi. No distress.  Heart:  S1, S2 present without murmurs appreciated.  Abdomen:  +BS, soft, non-tender and non-distended. No HSM noted. No guarding or rebound. No masses appreciated.  Rectal:  Deferred  Msk:  Symmetrical without gross deformities. Normal posture. Extremities:  Without edema. Neurologic:  Alert and  oriented x4;  grossly normal neurologically. Skin:  Intact without significant lesions or rashes. Psych:  Alert and cooperative. Normal mood and affect.   Assessment   Madeline Luckey is a 26 y.o. female with a history of *** presenting today with    PLAN   *****    Brooke Bonito, MSN, FNP-BC, AGACNP-BC [facility]

## 2022-06-23 ENCOUNTER — Telehealth: Payer: Self-pay | Admitting: *Deleted

## 2022-06-23 ENCOUNTER — Encounter: Payer: Self-pay | Admitting: Family

## 2022-06-23 ENCOUNTER — Encounter: Payer: Self-pay | Admitting: Obstetrics and Gynecology

## 2022-06-23 DIAGNOSIS — R87612 Low grade squamous intraepithelial lesion on cytologic smear of cervix (LGSIL): Secondary | ICD-10-CM

## 2022-06-23 HISTORY — DX: Low grade squamous intraepithelial lesion on cytologic smear of cervix (LGSIL): R87.612

## 2022-06-23 NOTE — Telephone Encounter (Signed)
Called and lvm about rescheduling appoints - requested callback to confirm

## 2022-06-24 ENCOUNTER — Telehealth: Payer: Self-pay | Admitting: General Practice

## 2022-06-24 NOTE — Telephone Encounter (Signed)
-----   Message from [place] Bing, MD sent at 06/23/2022  8:01 PM EDT ----- Please set up for colpo with me sometime in the next 4-6 weeks. thanks

## 2022-06-24 NOTE — Telephone Encounter (Signed)
Called patient concerning colpo appt which has been scheduled for 8/28. Per chart review, patient is aware of need for colpo. No answer.  Left message stating we are trying to reach you to make sure you were aware of the appt on 8/28 and to see if you had questions, if so please call us back.

## 2022-06-30 ENCOUNTER — Inpatient Hospital Stay: Payer: Medicaid Other | Attending: Hematology & Oncology

## 2022-06-30 ENCOUNTER — Encounter: Payer: Self-pay | Admitting: Family

## 2022-06-30 ENCOUNTER — Inpatient Hospital Stay (HOSPITAL_BASED_OUTPATIENT_CLINIC_OR_DEPARTMENT_OTHER): Payer: Medicaid Other | Admitting: Family

## 2022-06-30 VITALS — BP 118/68 | HR 75 | Resp 17 | Ht 64.0 in | Wt 142.0 lb

## 2022-06-30 DIAGNOSIS — D649 Anemia, unspecified: Secondary | ICD-10-CM | POA: Diagnosis present

## 2022-06-30 DIAGNOSIS — E538 Deficiency of other specified B group vitamins: Secondary | ICD-10-CM

## 2022-06-30 DIAGNOSIS — R233 Spontaneous ecchymoses: Secondary | ICD-10-CM | POA: Insufficient documentation

## 2022-06-30 DIAGNOSIS — D509 Iron deficiency anemia, unspecified: Secondary | ICD-10-CM | POA: Diagnosis not present

## 2022-06-30 DIAGNOSIS — Z79899 Other long term (current) drug therapy: Secondary | ICD-10-CM | POA: Diagnosis not present

## 2022-06-30 LAB — RETICULOCYTES
Immature Retic Fract: 5.3 % (ref 2.3–15.9)
RBC.: 3.8 MIL/uL — ABNORMAL LOW (ref 3.87–5.11)
Retic Count, Absolute: 33.1 10*3/uL (ref 19.0–186.0)
Retic Ct Pct: 0.9 % (ref 0.4–3.1)

## 2022-06-30 LAB — CBC WITH DIFFERENTIAL (CANCER CENTER ONLY)
Abs Immature Granulocytes: 0.04 10*3/uL (ref 0.00–0.07)
Basophils Absolute: 0 10*3/uL (ref 0.0–0.1)
Basophils Relative: 1 %
Eosinophils Absolute: 0.1 10*3/uL (ref 0.0–0.5)
Eosinophils Relative: 3 %
HCT: 36.2 % (ref 36.0–46.0)
Hemoglobin: 11.8 g/dL — ABNORMAL LOW (ref 12.0–15.0)
Immature Granulocytes: 1 %
Lymphocytes Relative: 39 %
Lymphs Abs: 1.5 10*3/uL (ref 0.7–4.0)
MCH: 30.6 pg (ref 26.0–34.0)
MCHC: 32.6 g/dL (ref 30.0–36.0)
MCV: 93.8 fL (ref 80.0–100.0)
Monocytes Absolute: 0.4 10*3/uL (ref 0.1–1.0)
Monocytes Relative: 10 %
Neutro Abs: 1.9 10*3/uL (ref 1.7–7.7)
Neutrophils Relative %: 46 %
Platelet Count: 296 10*3/uL (ref 150–400)
RBC: 3.86 MIL/uL — ABNORMAL LOW (ref 3.87–5.11)
RDW: 13 % (ref 11.5–15.5)
WBC Count: 4 10*3/uL (ref 4.0–10.5)
nRBC: 0 % (ref 0.0–0.2)

## 2022-06-30 LAB — VITAMIN B12: Vitamin B-12: 168 pg/mL — ABNORMAL LOW (ref 180–914)

## 2022-06-30 LAB — IRON AND IRON BINDING CAPACITY (CC-WL,HP ONLY)
Iron: 113 ug/dL (ref 28–170)
Saturation Ratios: 44 % — ABNORMAL HIGH (ref 10.4–31.8)
TIBC: 258 ug/dL (ref 250–450)
UIBC: 145 ug/dL — ABNORMAL LOW (ref 148–442)

## 2022-06-30 LAB — FOLATE: Folate: 5 ng/mL — ABNORMAL LOW (ref >5.9)

## 2022-06-30 LAB — FERRITIN: Ferritin: 115 ng/mL (ref 11–307)

## 2022-06-30 NOTE — Progress Notes (Signed)
Hematology and Oncology Follow Up Visit  Madeline Mccormick [id number] 06-17-1996 26 y.o. 06/30/2022   Principle Diagnosis:  Iron deficiency Bruising    Current Therapy:        IV iron as indicated    Interim History:  Madeline Mccormick is here today for follow-up. She is symptomatic with fatigue.  She has an appointment with GI on 8/2 for n/v and IBS related diarrhea and constipation. She states that when she eats she experiences severe abdominal pain.  No obvious blood loss noted. Her cycle last 3 days recently and she accidentally pulled out her IUD thinking it was a tampon. She does not plan to have the IUD replaced right now.  No other blood loss noted. Bruising is stable to improved. No petechiae.  No fever, chills, cough, rash, dizziness, SOB, chest pain, palpitations or changes in bladder habits.  No swelling, tenderness, numbness or tingling in her extremities.  No falls or syncope.  Her appetite comes and goes. She is doing her best to stay well hydrated. Her weight is stable at 142 lbs.   ECOG Performance Status: 1 - Symptomatic but completely ambulatory  Medications:  Allergies as of 06/30/2022   No Known Allergies      Medication List        Accurate as of June 30, 2022 12:46 PM. If you have any questions, ask your nurse or doctor.          ARIPiprazole 5 MG tablet Commonly known as: Abilify Take 1 tablet (5 mg total) by mouth daily.   escitalopram 20 MG tablet Commonly known as: LEXAPRO Take 1 tablet (20 mg total) by mouth daily.   lisdexamfetamine 30 MG capsule Commonly known as: Vyvanse Take 1 capsule (30 mg total) by mouth daily.        Allergies: No Known Allergies  Past Medical History, Surgical history, Social history, and Family History were reviewed and updated.  Review of Systems: All other 10 point review of systems is negative.   Physical Exam:  height is 5\' 4"  (1.626 m) and weight is 142 lb (64.4 kg). Her blood pressure is 118/68 and her  pulse is 75. Her respiration is 17 and oxygen saturation is 100%.   Wt Readings from Last 3 Encounters:  06/30/22 142 lb (64.4 kg)  06/16/22 140 lb (63.5 kg)  04/04/22 149 lb (67.6 kg)    Ocular: Sclerae unicteric, pupils equal, round and reactive to light Ear-nose-throat: Oropharynx clear, dentition fair Lymphatic: No cervical or supraclavicular adenopathy Lungs no rales or rhonchi, good excursion bilaterally Heart regular rate and rhythm, no murmur appreciated Abd soft, nontender, positive bowel sounds MSK no focal spinal tenderness, no joint edema Neuro: non-focal, well-oriented, appropriate affect Breasts: Deferred   Lab Results  Component Value Date   WBC 4.0 06/30/2022   HGB 11.8 (L) 06/30/2022   HCT 36.2 06/30/2022   MCV 93.8 06/30/2022   PLT 296 06/30/2022   Lab Results  Component Value Date   FERRITIN 213 04/04/2022   IRON 133 04/04/2022   TIBC 326 04/04/2022   UIBC 193 04/04/2022   IRONPCTSAT 41 (H) 04/04/2022   Lab Results  Component Value Date   RETICCTPCT 0.9 06/30/2022   RBC 3.86 (L) 06/30/2022   RBC 3.80 (L) 06/30/2022   No results found for: "KPAFRELGTCHN", "LAMBDASER", "KAPLAMBRATIO" No results found for: "IGGSERUM", "IGA", "IGMSERUM" No results found for: "TOTALPROTELP", "ALBUMINELP", "A1GS", "A2GS", "BETS", "BETA2SER", "GAMS", "MSPIKE", "SPEI"   Chemistry      Component Value  Date/Time   NA 141 02/15/2022 1049   K 3.2 (L) 02/15/2022 1049   CL 105 02/15/2022 1049   CO2 29 02/15/2022 1049   BUN 12 02/15/2022 1049   CREATININE 0.73 02/15/2022 1049      Component Value Date/Time   CALCIUM 8.4 (L) 02/15/2022 1049   ALKPHOS 77 02/15/2022 1049   AST 11 (L) 02/15/2022 1049   ALT 6 02/15/2022 1049   BILITOT 0.4 02/15/2022 1049       Impression and Plan: Madeline Mccormick is a very pleasant 26 yo caucasian female with intermittent iron deficiency anemia.  Iron studies are pending.  Follow-up in 3 months.   Eileen Stanford, NP 7/13/202312:46 PM

## 2022-07-01 ENCOUNTER — Telehealth: Payer: Self-pay

## 2022-07-01 ENCOUNTER — Other Ambulatory Visit: Payer: Self-pay | Admitting: Family

## 2022-07-01 DIAGNOSIS — E538 Deficiency of other specified B group vitamins: Secondary | ICD-10-CM

## 2022-07-01 NOTE — Telephone Encounter (Signed)
-----   Message from Erenest Blank, NP sent at 07/01/2022 11:03 AM EDT ----- Iron is good! B 12 is quite low!!! She can start taking the 2500 mcg sublingual tablet daily. This is over the counter! Thank you! We will recheck her B 12 level at follow-up.   Madeline Mccormick    ----- Message ----- From: Leory Plowman, Lab In [place] Sent: 06/30/2022  12:00 PM EDT To: Erenest Blank, NP

## 2022-07-01 NOTE — Telephone Encounter (Signed)
Advised via MyChart.

## 2022-07-04 ENCOUNTER — Inpatient Hospital Stay: Payer: Medicaid Other

## 2022-07-04 ENCOUNTER — Ambulatory Visit: Payer: Medicaid Other | Admitting: Family

## 2022-07-25 NOTE — Progress Notes (Unsigned)
GI Office Note    Referring Provider: [facility] Primary Care Physician:  [facility]  Primary Gastroenterologist: Dr. Jena Gauss  Chief Complaint   No chief complaint on file.   History of Present Illness   Madeline Mccormick is a 26 y.o. female presenting today at the request of [facility] for ***constipation.  Per referral paperwork second PCP office.  Patient had visit on 05/18/2022 where she reported abdominal pain in her right lower quadrant and suprapubic area described as aching and sudden in nature that started in April.  Given pelvic pain with vaginal discharge she had STI testing and was given Diflucan.  Ultrasound also performed.  She reportedly is seeing hematology oncology for weight loss.  Due to report of hemorrhoids she was started on Anusol suppositories and given GI referral for constipation.  Patient last seen by hematology oncology on 06/30/2022 for follow-up.  Patient reported abdominal pain when she eats as well as nausea/vomiting and IBS related diarrhea and constipation.  Patient recently accidentally pulled out her IUD.  She denied fever, chills, cough, rash, dizziness, shortness of breath, chest pain, palpitations, or changes in bladder habits.  She reported her appetite comes and goes and was doing her best to stay hydrated, her weight stable 142 pounds.  She had labs completed.  Vitamin B12 is low and was advised to start taking 2500 mcg sublingual tablet daily and she will have her level rechecked at her follow-up appointment.  Iron 113, saturation 44%, ferritin 115, folate 5, B12 168.  Today: Constipation -     Current Outpatient Medications  Medication Sig Dispense Refill   ARIPiprazole (ABILIFY) 5 MG tablet Take 1 tablet (5 mg total) by mouth daily. 90 tablet 0   escitalopram (LEXAPRO) 20 MG tablet Take 1 tablet (20 mg total) by mouth daily. 90 tablet 0   lisdexamfetamine (VYVANSE) 30 MG capsule Take 1 capsule (30 mg  total) by mouth daily. 30 capsule 0   No current facility-administered medications for this visit.   Facility-Administered Medications Ordered in Other Visits  Medication Dose Route Frequency Provider Last Rate Last Admin   sodium chloride flush (NS) 0.9 % injection 10 mL  10 mL Intracatheter Once PRN Erenest Blank, NP       sodium chloride flush (NS) 0.9 % injection 3 mL  3 mL Intracatheter Once PRN Erenest Blank, NP        Past Medical History:  Diagnosis Date   ADHD    previously took Vivant - stopped 01/2019   Allergy    Anxiety    Depression    History of pre-eclampsia 10/21/2019    Past Surgical History:  Procedure Laterality Date   DENTAL SURGERY     THERAPEUTIC ABORTION      Family History  Problem Relation Age of Onset   Arthritis Maternal Grandmother    Stroke Maternal Grandmother    Sudden death Maternal Grandmother    Depression Mother    Diabetes Mother    Thyroid disease Mother     Allergies as of 07/26/2022   (No Known Allergies)    Social History   Socioeconomic History   Marital status: Single    Spouse name: Not on file   Number of children: Not on file   Years of education: Not on file   Highest education level: Not on file  Occupational History    Comment: unemployed  Tobacco Use   Smoking status: Every Day  Types: E-cigarettes   Smokeless tobacco: Never   Tobacco comments:    Vapes  Vaping Use   Vaping Use: Every day  Substance and Sexual Activity   Alcohol use: No    Alcohol/week: 0.0 standard drinks of alcohol   Drug use: No   Sexual activity: Yes    Birth control/protection: None  Other Topics Concern   Not on file  Social History Narrative   Not on file   Social Determinants of Health   Financial Resource Strain: Not on file  Food Insecurity: No Food Insecurity (06/16/2022)   Hunger Vital Sign    Worried About Running Out of Food in the Last Year: Never true    Ran Out of Food in the Last Year: Never true   Transportation Needs: No Transportation Needs (06/16/2022)   PRAPARE - Administrator, Civil Service (Medical): No    Lack of Transportation (Non-Medical): No  Physical Activity: Not on file  Stress: Not on file  Social Connections: Not on file  Intimate Partner Violence: Not At Risk (04/09/2019)   Humiliation, Afraid, Rape, and Kick questionnaire    Fear of Current or Ex-Partner: No    Emotionally Abused: No    Physically Abused: No    Sexually Abused: No     Review of Systems   Gen: Denies any fever, chills, fatigue, weight loss, lack of appetite.  CV: Denies chest pain, heart palpitations, peripheral edema, syncope.  Resp: Denies shortness of breath at rest or with exertion. Denies wheezing or cough.  GI: see HPI GU : Denies urinary burning, urinary frequency, urinary hesitancy MS: Denies joint pain, muscle weakness, cramps, or limitation of movement.  Derm: Denies rash, itching, dry skin Psych: Denies depression, anxiety, memory loss, and confusion Heme: Denies bruising, bleeding, and enlarged lymph nodes.   Physical Exam   There were no vitals taken for this visit.  General:   Alert and oriented. Pleasant and cooperative. Well-nourished and well-developed.  Head:  Normocephalic and atraumatic. Eyes:  Without icterus, sclera clear and conjunctiva pink.  Ears:  Normal auditory acuity. Mouth:  No deformity or lesions, oral mucosa pink.  Lungs:  Clear to auscultation bilaterally. No wheezes, rales, or rhonchi. No distress.  Heart:  S1, S2 present without murmurs appreciated.  Abdomen:  +BS, soft, non-tender and non-distended. No HSM noted. No guarding or rebound. No masses appreciated.  Rectal:  Deferred  Msk:  Symmetrical without gross deformities. Normal posture. Extremities:  Without edema. Neurologic:  Alert and  oriented x4;  grossly normal neurologically. Skin:  Intact without significant lesions or rashes. Psych:  Alert and cooperative. Normal mood and  affect.   Assessment   Madeline Mccormick is a 26 y.o. female with a history of ADHD, anxiety, depression, anemia, and preeclampsia in 2020*** presenting today with constipation.  Constipation:    PLAN   ***    Brooke Bonito, MSN, FNP-BC, AGACNP-BC [facility]

## 2022-07-26 ENCOUNTER — Ambulatory Visit (INDEPENDENT_AMBULATORY_CARE_PROVIDER_SITE_OTHER): Payer: Medicaid Other | Admitting: Gastroenterology

## 2022-07-26 ENCOUNTER — Encounter: Payer: Self-pay | Admitting: Gastroenterology

## 2022-07-26 VITALS — BP 130/87 | HR 102 | Temp 97.9°F | Ht 64.0 in | Wt 145.4 lb

## 2022-07-26 DIAGNOSIS — R1084 Generalized abdominal pain: Secondary | ICD-10-CM | POA: Diagnosis not present

## 2022-07-26 DIAGNOSIS — D508 Other iron deficiency anemias: Secondary | ICD-10-CM

## 2022-07-26 DIAGNOSIS — R634 Abnormal weight loss: Secondary | ICD-10-CM

## 2022-07-26 DIAGNOSIS — K641 Second degree hemorrhoids: Secondary | ICD-10-CM

## 2022-07-26 DIAGNOSIS — K5900 Constipation, unspecified: Secondary | ICD-10-CM

## 2022-07-26 DIAGNOSIS — R197 Diarrhea, unspecified: Secondary | ICD-10-CM | POA: Diagnosis not present

## 2022-07-26 MED ORDER — HYDROCORTISONE (PERIANAL) 2.5 % EX CREA: 1.0000 | TOPICAL_CREAM | Freq: Two times a day (BID) | CUTANEOUS | 1 refills | Status: DC

## 2022-07-26 NOTE — Patient Instructions (Signed)
Is a pleasure to meet you today!  I am ordering labs for you completed at [facility].  This is across the street from [facility], on [address]  It is in a two-story building and is on the second floor.  Our scheduler will contact you with a time and date for your abdominal ultrasound.  For your hemorrhoid, I am sending in Anusol rectal cream for you to use twice daily for 1 week then as needed.  If you continue to have issues with your hemorrhoids we can set you up for an appointment for hemorrhoid banding.  To help control your constipation and make sure you stay hydrated with your diarrhea I want you to increase your water intake with at least 2-3 bottles of water daily.  It also would be beneficial for you to increase fiber in your diet, I recommend a supplement such as Benefiber with 2 to 3 teaspoons daily.  I am also going to send in a prescription for dicyclomine for you to take up to 3 times a day as needed for abdominal pain.  If you start moving more towards constipation route please reduce your frequency of this medication as it can cause constipation but it is good for abdominal pain.  After I received your result we will be in contact with the regarding further recommendations.  For now I would say lets follow-up in 2 months.  It was a pleasure to see you today. I want to create trusting relationships with patients. If you receive a survey regarding your visit,  I greatly appreciate you taking time to fill this out on paper or through your MyChart. I value your feedback.  Brooke Bonito, MSN, FNP-BC, AGACNP-BC [facility]

## 2022-07-27 ENCOUNTER — Telehealth: Payer: Self-pay | Admitting: *Deleted

## 2022-07-27 ENCOUNTER — Encounter: Payer: Self-pay | Admitting: *Deleted

## 2022-07-27 LAB — IGA: Immunoglobulin A: 261 mg/dL (ref 47–310)

## 2022-07-27 LAB — TISSUE TRANSGLUTAMINASE, IGA: (tTG) Ab, IgA: <1 U/mL

## 2022-07-27 NOTE — Telephone Encounter (Signed)
Patient called and left vm YI:RSWN.   Called pt back and gave appt date 08/02/22 at 8am at [facility], NPO after midnight. Pt verbalized understanding

## 2022-08-02 ENCOUNTER — Ambulatory Visit (HOSPITAL_COMMUNITY)
Admission: RE | Admit: 2022-08-02 | Discharge: 2022-08-02 | Disposition: A | Discharge status: Home / Self Care | Payer: Medicaid Other | Source: Ambulatory Visit | Attending: Gastroenterology | Admitting: Gastroenterology

## 2022-08-02 DIAGNOSIS — R634 Abnormal weight loss: Secondary | ICD-10-CM | POA: Diagnosis present

## 2022-08-02 DIAGNOSIS — R1084 Generalized abdominal pain: Secondary | ICD-10-CM | POA: Diagnosis present

## 2022-08-02 DIAGNOSIS — R197 Diarrhea, unspecified: Secondary | ICD-10-CM | POA: Diagnosis present

## 2022-08-03 ENCOUNTER — Encounter (HOSPITAL_COMMUNITY): Payer: Self-pay | Admitting: Psychiatry

## 2022-08-03 ENCOUNTER — Telehealth (HOSPITAL_BASED_OUTPATIENT_CLINIC_OR_DEPARTMENT_OTHER): Payer: Medicaid Other | Admitting: Psychiatry

## 2022-08-03 DIAGNOSIS — F331 Major depressive disorder, recurrent, moderate: Secondary | ICD-10-CM | POA: Diagnosis not present

## 2022-08-03 DIAGNOSIS — F902 Attention-deficit hyperactivity disorder, combined type: Secondary | ICD-10-CM | POA: Diagnosis not present

## 2022-08-03 DIAGNOSIS — F419 Anxiety disorder, unspecified: Secondary | ICD-10-CM | POA: Diagnosis not present

## 2022-08-03 MED ORDER — ESCITALOPRAM OXALATE 20 MG PO TABS: 20.0000 mg | ORAL_TABLET | Freq: Every day | ORAL | 0 refills | Status: DC

## 2022-08-03 MED ORDER — LISDEXAMFETAMINE DIMESYLATE 30 MG PO CAPS: 30.0000 mg | ORAL_CAPSULE | Freq: Every day | ORAL | 0 refills | Status: DC

## 2022-08-03 MED ORDER — ARIPIPRAZOLE 5 MG PO TABS: 5.0000 mg | ORAL_TABLET | Freq: Every day | ORAL | 0 refills | Status: DC

## 2022-08-03 NOTE — Progress Notes (Signed)
Virtual Visit via Telephone Note  I connected with Madeline Mccormick on 08/03/22 at 10:40 AM EDT by telephone and verified that I am speaking with the correct person using two identifiers.  Location: Patient: Home Provider: Home Office   I discussed the limitations, risks, security and privacy concerns of performing an evaluation and management service by telephone and the availability of in person appointments. I also discussed with the patient that there may be a patient responsible charge related to this service. The patient expressed understanding and agreed to proceed.   History of Present Illness: Patient is evaluated by phone session.  She is taking all her medication as prescribed.  She reported summer was good and they went to the beach and had a good time.  Lately patient is having some nausea which is chronic and seen the GI doctor.  She is scheduled to have an ultrasound.  Her appetite is okay and her weight is unchanged.  She feels her depression is also stable and denies any major panic attack, crying spells or any feeling of hopelessness or worthlessness.  Her attention focus is good.  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  She is able to finish her job at time.  She is getting infusion.  Sometimes she feels fatigue.  Her sleep is good.  Her school started and she is hoping to graduate next year.  She is majoring in Merchandiser, retail.  She denies drinking or using any illegal substances.    Past Psychiatric History: H/O ADHD diagnosed by PCP but no formal psychological testing was done. Took Vyvanse in 2014 until d/c in February 2020 after got pregnant.  Taking Lexapro since 2018. Tried BuSpar and Lamictal. Wellbutrin did not work. No h/o suicidal attempt, inpatient treatment, hallucination, OCD, PTSD or panic attacks.   Psychiatric Specialty Exam: Physical Exam  Review of Systems  Weight 145 lb (65.8 kg), last menstrual period 07/18/2022.There is no height or weight on file to  calculate BMI.  General Appearance: NA  Eye Contact:  NA  Speech:  Normal Rate  Volume:  Normal  Mood:  Anxious  Affect:  NA  Thought Process:  Goal Directed  Orientation:  Full (Time, Place, and Person)  Thought Content:  WDL  Suicidal Thoughts:  No  Homicidal Thoughts:  No  Memory:  Immediate;   Good Recent;   Good Remote;   Good  Judgement:  Intact  Insight:  Present  Psychomotor Activity:  NA  Concentration:  Concentration: Good and Attention Span: Good  Recall:  Good  Fund of Knowledge:  Good  Language:  Good  Akathisia:  No  Handed:  Right  AIMS (if indicated):     Assets:  Communication Skills Desire for Improvement Housing Resilience Talents/Skills Transportation  ADL's:  Intact  Cognition:  WNL  Sleep:   ok      Assessment and Plan: Major depressive disorder, recurrent.  Anxiety.  ADHD, combined type.  Patient is stable on her current medication.  Continue Vyvanse 30 mg in the morning, Abilify 5 mg daily and Lexapro 20 mg daily.  Recommended to call us back if she has any question or any concern.  Follow-up in 3 months.  Follow Up Instructions:    I discussed the assessment and treatment plan with the patient. The patient was provided an opportunity to ask questions and all were answered. The patient agreed with the plan and demonstrated an understanding of the instructions.   The patient was advised to call  back or seek an in-person evaluation if the symptoms worsen or if the condition fails to improve as anticipated.  Collaboration of Care: Primary Care Provider AEB notes are available in epic to review.  Patient/Guardian was advised Release of Information must be obtained prior to any record release in order to collaborate their care with an outside provider. Patient/Guardian was advised if they have not already done so to contact the registration department to sign all necessary forms in order for [place] to release information regarding their care.    Consent: Patient/Guardian gives verbal consent for treatment and assignment of benefits for services provided during this visit. Patient/Guardian expressed understanding and agreed to proceed.    I provided 18 minutes of non-face-to-face time during this encounter.   Cleotis Nipper, MD

## 2022-08-05 ENCOUNTER — Other Ambulatory Visit (HOSPITAL_COMMUNITY)
Admission: RE | Admit: 2022-08-05 | Discharge: 2022-08-05 | Disposition: A | Discharge status: Home / Self Care | Payer: Medicaid Other | Source: Ambulatory Visit | Attending: Gastroenterology | Admitting: Gastroenterology

## 2022-08-05 DIAGNOSIS — R197 Diarrhea, unspecified: Secondary | ICD-10-CM | POA: Insufficient documentation

## 2022-08-05 DIAGNOSIS — R634 Abnormal weight loss: Secondary | ICD-10-CM | POA: Insufficient documentation

## 2022-08-05 LAB — COMPREHENSIVE METABOLIC PANEL
ALT: 7 U/L (ref 0–44)
AST: 12 U/L — ABNORMAL LOW (ref 15–41)
Albumin: 4.2 g/dL (ref 3.5–5.0)
Alkaline Phosphatase: 47 U/L (ref 38–126)
Anion gap: 7 (ref 5–15)
BUN: 11 mg/dL (ref 6–20)
CO2: 22 mmol/L (ref 22–32)
Calcium: 8.7 mg/dL — ABNORMAL LOW (ref 8.9–10.3)
Chloride: 107 mmol/L (ref 98–111)
Creatinine, Ser: 0.63 mg/dL (ref 0.44–1.00)
GFR, Estimated: >60 mL/min (ref >60)
Glucose, Bld: 91 mg/dL (ref 70–99)
Potassium: 3.6 mmol/L (ref 3.5–5.1)
Sodium: 136 mmol/L (ref 135–145)
Total Bilirubin: 0.4 mg/dL (ref 0.3–1.2)
Total Protein: 7.4 g/dL (ref 6.5–8.1)

## 2022-08-11 ENCOUNTER — Encounter: Payer: Self-pay | Admitting: *Deleted

## 2022-08-11 DIAGNOSIS — K5900 Constipation, unspecified: Secondary | ICD-10-CM

## 2022-08-11 DIAGNOSIS — R197 Diarrhea, unspecified: Secondary | ICD-10-CM

## 2022-08-11 DIAGNOSIS — D508 Other iron deficiency anemias: Secondary | ICD-10-CM

## 2022-08-11 MED ORDER — CLENPIQ 10-3.5-12 MG-GM -GM/175ML PO SOLN: 1.0000 | ORAL | 0 refills | Status: DC

## 2022-08-15 ENCOUNTER — Encounter: Payer: Self-pay | Admitting: Obstetrics and Gynecology

## 2022-08-15 ENCOUNTER — Other Ambulatory Visit (HOSPITAL_COMMUNITY)
Admission: RE | Admit: 2022-08-15 | Discharge: 2022-08-15 | Disposition: A | Discharge status: Home / Self Care | Payer: Medicaid Other | Source: Ambulatory Visit | Attending: Obstetrics and Gynecology | Admitting: Obstetrics and Gynecology

## 2022-08-15 ENCOUNTER — Other Ambulatory Visit: Payer: Self-pay

## 2022-08-15 ENCOUNTER — Ambulatory Visit (INDEPENDENT_AMBULATORY_CARE_PROVIDER_SITE_OTHER): Payer: Medicaid Other | Admitting: Obstetrics and Gynecology

## 2022-08-15 VITALS — BP 120/80 | HR 71 | Wt 146.2 lb

## 2022-08-15 DIAGNOSIS — R87612 Low grade squamous intraepithelial lesion on cytologic smear of cervix (LGSIL): Secondary | ICD-10-CM | POA: Insufficient documentation

## 2022-08-15 HISTORY — PX: COLPOSCOPY: PRO47

## 2022-08-15 LAB — POCT PREGNANCY, URINE: Preg Test, Ur: NEGATIVE

## 2022-08-15 NOTE — Procedures (Addendum)
Colposcopy Procedure Note  Pre-operative Diagnosis:  06/16/2022 pap: LSIL, no HPV done 12/2013 pap: negative  Post-operative Diagnosis: CIN 1  Procedure Details  Last menstrual period: today Urine pregnancy test: negative.   The risks (including infection, bleeding, pain) and benefits of the procedure were explained to the patient and written informed consent was obtained.  The patient was placed in the dorsal lithotomy position. A Graves was speculum inserted in the vagina, and the cervix was visualized.  Acetic acid staining was done and the cervix was viewed with green filter; lugol's staining with green filter was also done .  Biopsy from 10 o'clock was done; there was no bleeding after procedure.   Findings: circumferential awe changes and some increased vascularity and friability at 10 o'clock  Adequate: Yes  Specimens: 10 o'clock cervical  Condition: Stable  Complications: None  Plan:  The patient was advised to call for any fever or for prolonged or severe pain or bleeding. She was advised to use OTC analgesics as needed for mild to moderate pain. She was advised to avoid vaginal intercourse for 1 week  Birth control options also d/w her and patient still undecided. BP okay today. She would be fine for estrogen options and BP check 4-6wks after starting if she elects for that.    Cornelia Copa MD Attending Center for [employer] [facility])

## 2022-08-17 LAB — SURGICAL PATHOLOGY

## 2022-08-24 ENCOUNTER — Other Ambulatory Visit (HOSPITAL_COMMUNITY)
Admission: RE | Admit: 2022-08-24 | Discharge: 2022-08-24 | Disposition: A | Discharge status: Home / Self Care | Payer: Medicaid Other | Source: Ambulatory Visit | Attending: Internal Medicine | Admitting: Internal Medicine

## 2022-08-24 DIAGNOSIS — R197 Diarrhea, unspecified: Secondary | ICD-10-CM | POA: Insufficient documentation

## 2022-08-24 DIAGNOSIS — K5900 Constipation, unspecified: Secondary | ICD-10-CM | POA: Insufficient documentation

## 2022-08-24 DIAGNOSIS — D508 Other iron deficiency anemias: Secondary | ICD-10-CM | POA: Diagnosis present

## 2022-08-24 LAB — PREGNANCY, URINE: Preg Test, Ur: NEGATIVE

## 2022-08-26 ENCOUNTER — Encounter (HOSPITAL_COMMUNITY)
Admission: RE | Disposition: A | Discharge status: Home / Self Care | Payer: Self-pay | Source: Ambulatory Visit | Attending: Internal Medicine

## 2022-08-26 ENCOUNTER — Ambulatory Visit (HOSPITAL_COMMUNITY)
Admission: RE | Admit: 2022-08-26 | Discharge: 2022-08-26 | Disposition: A | Discharge status: Home / Self Care | Payer: Medicaid Other | Source: Ambulatory Visit | Attending: Internal Medicine | Admitting: Internal Medicine

## 2022-08-26 ENCOUNTER — Other Ambulatory Visit: Payer: Self-pay

## 2022-08-26 ENCOUNTER — Ambulatory Visit (HOSPITAL_COMMUNITY): Payer: Medicaid Other | Admitting: Anesthesiology

## 2022-08-26 ENCOUNTER — Encounter (HOSPITAL_COMMUNITY): Payer: Self-pay | Admitting: Internal Medicine

## 2022-08-26 ENCOUNTER — Ambulatory Visit (HOSPITAL_BASED_OUTPATIENT_CLINIC_OR_DEPARTMENT_OTHER): Payer: Medicaid Other | Admitting: Anesthesiology

## 2022-08-26 DIAGNOSIS — R194 Change in bowel habit: Secondary | ICD-10-CM

## 2022-08-26 DIAGNOSIS — D509 Iron deficiency anemia, unspecified: Secondary | ICD-10-CM | POA: Diagnosis not present

## 2022-08-26 DIAGNOSIS — K640 First degree hemorrhoids: Secondary | ICD-10-CM | POA: Insufficient documentation

## 2022-08-26 DIAGNOSIS — R1084 Generalized abdominal pain: Secondary | ICD-10-CM

## 2022-08-26 DIAGNOSIS — F17290 Nicotine dependence, other tobacco product, uncomplicated: Secondary | ICD-10-CM | POA: Diagnosis not present

## 2022-08-26 DIAGNOSIS — R1013 Epigastric pain: Secondary | ICD-10-CM

## 2022-08-26 DIAGNOSIS — F909 Attention-deficit hyperactivity disorder, unspecified type: Secondary | ICD-10-CM | POA: Insufficient documentation

## 2022-08-26 DIAGNOSIS — I10 Essential (primary) hypertension: Secondary | ICD-10-CM | POA: Diagnosis not present

## 2022-08-26 DIAGNOSIS — K200 Eosinophilic esophagitis: Secondary | ICD-10-CM | POA: Insufficient documentation

## 2022-08-26 HISTORY — PX: BIOPSY: SHX5522

## 2022-08-26 HISTORY — PX: ESOPHAGOGASTRODUODENOSCOPY (EGD) WITH PROPOFOL: SHX5813

## 2022-08-26 HISTORY — PX: COLONOSCOPY WITH PROPOFOL: SHX5780

## 2022-08-26 SURGERY — COLONOSCOPY WITH PROPOFOL
Anesthesia: General

## 2022-08-26 MED ORDER — DEXMEDETOMIDINE (PRECEDEX) IN NS 20 MCG/5ML (4 MCG/ML) IV SYRINGE
PREFILLED_SYRINGE | INTRAVENOUS | Status: DC | PRN
  Administered 2022-08-26: 12 ug via INTRAVENOUS

## 2022-08-26 MED ORDER — PROPOFOL 500 MG/50ML IV EMUL
INTRAVENOUS | Status: DC | PRN
  Administered 2022-08-26: 150 ug/kg/min via INTRAVENOUS

## 2022-08-26 MED ORDER — LIDOCAINE HCL (CARDIAC) PF 100 MG/5ML IV SOSY
PREFILLED_SYRINGE | INTRAVENOUS | Status: DC | PRN
  Administered 2022-08-26: 50 mg via INTRAVENOUS

## 2022-08-26 MED ORDER — PROPOFOL 10 MG/ML IV BOLUS
INTRAVENOUS | Status: DC | PRN
  Administered 2022-08-26: 100 mg via INTRAVENOUS

## 2022-08-26 MED ORDER — LACTATED RINGERS IV SOLN
INTRAVENOUS | Status: DC
  Administered 2022-08-26: 1000 mL via INTRAVENOUS

## 2022-08-26 NOTE — Transfer of Care (Signed)
Immediate Anesthesia Transfer of Care Note  Patient: Madeline Mccormick  Procedure(s) Performed: COLONOSCOPY WITH PROPOFOL ESOPHAGOGASTRODUODENOSCOPY (EGD) WITH PROPOFOL BIOPSY  Patient Location: PACU  Anesthesia Type:General  Level of Consciousness: awake, alert  and patient cooperative  Airway & Oxygen Therapy: Patient Spontanous Breathing  Post-op Assessment: Report given to RN, Post -op Vital signs reviewed and stable and Patient moving all extremities X 4  Post vital signs: Reviewed and stable  Last Vitals:  Vitals Value Taken Time  BP 99/58 08/26/22 1226  Temp 37 C 08/26/22 1223  Pulse 65 08/26/22 1226  Resp 12 08/26/22 1226  SpO2 98 % 08/26/22 1226    Last Pain:  Vitals:   08/26/22 1223  TempSrc: Oral  PainSc: 0-No pain      Patients Stated Pain Goal: 7 (08/26/22 1001)  Complications: No notable events documented.

## 2022-08-26 NOTE — Op Note (Signed)
[facility] Patient Name: Madeline Mccormick Procedure Date: 08/26/2022 11:31 AM MRN: [id number] Date of Birth: 1996-08-02 Attending MD: [name] , MD CSN: [id number] Age: 26 Admit Type: Outpatient Procedure:                Colonoscopy Indications:              Generalized abdominal pain, Iron deficiency anemia,                            Change in bowel habits Providers:                [name], MD, Crystal Page, Hinton Rao, Kristine L. Jessee Avers, Technician Referring MD:              Medicines:                Propofol per Anesthesia Complications:            No immediate complications. Estimated Blood Loss:     Estimated blood loss: none. Procedure:                Pre-Anesthesia Assessment:                           - Prior to the procedure, a History and Physical                            was performed, and patient medications and                            allergies were reviewed. The patient's tolerance of                            previous anesthesia was also reviewed. The risks                            and benefits of the procedure and the sedation                            options and risks were discussed with the patient.                            All questions were answered, and informed consent                            was obtained. Prior Anticoagulants: The patient has                            taken no previous anticoagulant or antiplatelet                            agents. ASA Grade Assessment: II - A patient with  mild systemic disease. After reviewing the risks                            and benefits, the patient was deemed in                            satisfactory condition to undergo the procedure.                           After obtaining informed consent, the colonoscope                            was passed under direct vision. Throughout the                             procedure, the patient's blood pressure, pulse, and                            oxygen saturations were monitored continuously. The                            [id number]) scope was introduced through the                            anus and advanced to the 5 cm into the ileum. The                            colonoscopy was performed without difficulty. The                            patient tolerated the procedure well. The quality                            of the bowel preparation was adequate. Scope In: 12:04:12 PM Scope Out: 12:20:29 PM Scope Withdrawal Time: 0 hours 8 minutes 38 seconds  Total Procedure Duration: 0 hours 16 minutes 17 seconds  Findings:      The perianal and digital rectal examinations were normal.      The colon (entire examined portion) appeared normal.      Non-bleeding internal hemorrhoids were found during endoscopy. The       hemorrhoids were mild, small and Grade I (internal hemorrhoids that do       not prolapse). Rectal vault too small to retroflex. Rectal mucosa seen       well on?"face. Impression:               - The entire examined colon is normal.                           - Non-bleeding internal hemorrhoids.                           -Normal-appearing terminal ileum. Moderate Sedation:      Moderate (conscious) sedation was personally administered by an       anesthesia professional. The following parameters were monitored: oxygen  saturation, heart rate, blood pressure, and response to care. Recommendation:           - Patient has a contact number available for                            emergencies. The signs and symptoms of potential                            delayed complications were discussed with the                            patient. Return to normal activities tomorrow.                            Written discharge instructions were provided to the                            patient.                           - Advance diet as  tolerated.                           - Continue present medications.                           - Repeat colonoscopy at age 50 for screening                            purposes. See EGD report. Procedure Code(s):        --- Professional ---                           [phone], Colonoscopy, flexible; diagnostic, including                            collection of specimen(s) by brushing or washing,                            when performed (separate procedure) Diagnosis Code(s):        --- Professional ---                           K64.0, First degree hemorrhoids                           R10.84, Generalized abdominal pain                           D50.9, Iron deficiency anemia, unspecified                           R19.4, Change in bowel habit CPT copyright 2019 American Medical Association. All rights reserved. The codes documented in this report are preliminary and upon coder review may  be revised to meet current compliance requirements. Gerrit Friends. Jena Gauss, MD [name], MD 08/26/2022 12:27:27 PM  This report has been signed electronically. Number of Addenda: 0

## 2022-08-26 NOTE — Anesthesia Preprocedure Evaluation (Addendum)
Anesthesia Evaluation  Patient identified by MRN, date of birth, ID band Patient awake    Reviewed: Allergy & Precautions, NPO status , Patient's Chart, lab work & pertinent test results  Airway Mallampati: II  TM Distance: >3 FB Neck ROM: Full    Dental  (+) Dental Advisory Given, Teeth Intact   Pulmonary Current Smoker and Patient abstained from smoking.,           Cardiovascular Exercise Tolerance: Good hypertension, Pt. on medications  Rhythm:Regular Rate:Normal     Neuro/Psych PSYCHIATRIC DISORDERS Anxiety Depression negative neurological ROS     GI/Hepatic negative GI ROS, Neg liver ROS,   Endo/Other  negative endocrine ROS  Renal/GU negative Renal ROS  negative genitourinary   Musculoskeletal negative musculoskeletal ROS (+)   Abdominal   Peds negative pediatric ROS (+) ADHD Hematology  (+) Blood dyscrasia, anemia ,   Anesthesia Other Findings   Reproductive/Obstetrics negative OB ROS                            Anesthesia Physical Anesthesia Plan  ASA: 2  Anesthesia Plan: General   Post-op Pain Management: Minimal or no pain anticipated   Induction: Intravenous  PONV Risk Score and Plan: Propofol infusion  Airway Management Planned: Nasal Cannula and Natural Airway  Additional Equipment:   Intra-op Plan:   Post-operative Plan:   Informed Consent: I have reviewed the patients History and Physical, chart, labs and discussed the procedure including the risks, benefits and alternatives for the proposed anesthesia with the patient or authorized representative who has indicated his/her understanding and acceptance.     Dental advisory given  Plan Discussed with: CRNA and Surgeon  Anesthesia Plan Comments:        Anesthesia Quick Evaluation

## 2022-08-26 NOTE — Anesthesia Postprocedure Evaluation (Signed)
Anesthesia Post Note  Patient: Madeline Mccormick  Procedure(s) Performed: COLONOSCOPY WITH PROPOFOL ESOPHAGOGASTRODUODENOSCOPY (EGD) WITH PROPOFOL BIOPSY  Patient location during evaluation: Phase II Anesthesia Type: General Level of consciousness: awake and alert and oriented Pain management: pain level controlled Vital Signs Assessment: post-procedure vital signs reviewed and stable Respiratory status: spontaneous breathing, nonlabored ventilation and respiratory function stable Cardiovascular status: blood pressure returned to baseline and stable Postop Assessment: no apparent nausea or vomiting Anesthetic complications: no   No notable events documented.   Last Vitals:  Vitals:   08/26/22 1223 08/26/22 1226  BP: (!) 83/42 (!) 99/58  Pulse: 67 65  Resp: 11 12  Temp: 37 C   SpO2: 97% 98%    Last Pain:  Vitals:   08/26/22 1223  TempSrc: Oral  PainSc: 0-No pain                 Ceasar Decandia C Babara [place]

## 2022-08-26 NOTE — Discharge Instructions (Addendum)
Colonoscopy Discharge Instructions  Read the instructions outlined below and refer to this sheet in the next few weeks. These discharge instructions provide you with general information on caring for yourself after you leave the hospital. Your doctor may also give you specific instructions. While your treatment has been planned according to the most current medical practices available, unavoidable complications occasionally occur. If you have any problems or questions after discharge, call Dr. Gala Romney at [phone]. ACTIVITY You may resume your regular activity, but move at a slower pace for the next 24 hours.  Take frequent rest periods for the next 24 hours.  Walking will help get rid of the air and reduce the bloated feeling in your belly (abdomen).  No driving for 24 hours (because of the medicine (anesthesia) used during the test).   Do not sign any important legal documents or operate any machinery for 24 hours (because of the anesthesia used during the test).  NUTRITION Drink plenty of fluids.  You may resume your normal diet as instructed by your doctor.  Begin with a light meal and progress to your normal diet. Heavy or fried foods are harder to digest and may make you feel sick to your stomach (nauseated).  Avoid alcoholic beverages for 24 hours or as instructed.  MEDICATIONS You may resume your normal medications unless your doctor tells you otherwise.  WHAT YOU CAN EXPECT TODAY Some feelings of bloating in the abdomen.  Passage of more gas than usual.  Spotting of blood in your stool or on the toilet paper.  IF YOU HAD POLYPS REMOVED DURING THE COLONOSCOPY: No aspirin products for 7 days or as instructed.  No alcohol for 7 days or as instructed.  Eat a soft diet for the next 24 hours.  FINDING OUT THE RESULTS OF YOUR TEST Not all test results are available during your visit. If your test results are not back during the visit, make an appointment with your caregiver to find out the  results. Do not assume everything is normal if you have not heard from your caregiver or the medical facility. It is important for you to follow up on all of your test results.  SEEK IMMEDIATE MEDICAL ATTENTION IF: You have more than a spotting of blood in your stool.  Your belly is swollen (abdominal distention).  You are nauseated or vomiting.  You have a temperature over 101.  You have abdominal pain or discomfort that is severe or gets worse throughout the day.   EGD Discharge instructions Please read the instructions outlined below and refer to this sheet in the next few weeks. These discharge instructions provide you with general information on caring for yourself after you leave the hospital. Your doctor may also give you specific instructions. While your treatment has been planned according to the most current medical practices available, unavoidable complications occasionally occur. If you have any problems or questions after discharge, please call your doctor. ACTIVITY You may resume your regular activity but move at a slower pace for the next 24 hours.  Take frequent rest periods for the next 24 hours.  Walking will help expel (get rid of) the air and reduce the bloated feeling in your abdomen.  No driving for 24 hours (because of the anesthesia (medicine) used during the test).  You may shower.  Do not sign any important legal documents or operate any machinery for 24 hours (because of the anesthesia used during the test).  NUTRITION Drink plenty of fluids.  You may  resume your normal diet.  Begin with a light meal and progress to your normal diet.  Avoid alcoholic beverages for 24 hours or as instructed by your caregiver.  MEDICATIONS You may resume your normal medications unless your caregiver tells you otherwise.  WHAT YOU CAN EXPECT TODAY You may experience abdominal discomfort such as a feeling of fullness or "gas" pains.  FOLLOW-UP Your doctor will discuss the results of  your test with you.  SEEK IMMEDIATE MEDICAL ATTENTION IF ANY OF THE FOLLOWING OCCUR: Excessive nausea (feeling sick to your stomach) and/or vomiting.  Severe abdominal pain and distention (swelling).  Trouble swallowing.  Temperature over 101 F (37.8 C).  Rectal bleeding or vomiting of blood.     You do have hemorrhoids otherwise, your colonoscopy was normal  Recommend a repeat colonoscopy for cancer screening beginning at age 47  Pamphlet on hemorrhoid banding provided  You have a little irritation in your esophagus which may be due to pills being caught.  Please swallow your medications with a full glass of water or other liquids and stay upright for 30 minutes after ingesting.  Samples of your esophagus were taken.  Further recommendations to follow pending review of pathology report  Office visit with Brooke Bonito in 6 weeks.  Office will notify you.    At patient request, I called Irma Newness at [phone] -"voice mailbox not set up yet"

## 2022-08-26 NOTE — Op Note (Signed)
[facility] Patient Name: Madeline Mccormick Procedure Date: 08/26/2022 11:30 AM MRN: [id number] Date of Birth: 1996/10/09 Attending MD: Norvel Richards , MD CSN: [id number] Age: 26 Admit Type: Outpatient Procedure:                Upper GI endoscopy Indications:              Epigastric abdominal pain Providers:                Norvel Richards, MD, [place] Page, Everardo Pacific, Shiloh Risa Grill, Technician Referring MD:              Medicines:                Propofol per Anesthesia Complications:            No immediate complications. Estimated Blood Loss:     Estimated blood loss was minimal. Procedure:                Pre-Anesthesia Assessment:                           - Prior to the procedure, a History and Physical                            was performed, and patient medications and                            allergies were reviewed. The patient's tolerance of                            previous anesthesia was also reviewed. The risks                            and benefits of the procedure and the sedation                            options and risks were discussed with the patient.                            All questions were answered, and informed consent                            was obtained. Prior Anticoagulants: The patient has                            taken no previous anticoagulant or antiplatelet                            agents. ASA Grade Assessment: II - A patient with                            mild systemic disease. After reviewing the risks  and benefits, the patient was deemed in                            satisfactory condition to undergo the procedure.                           After obtaining informed consent, the endoscope was                            passed under direct vision. Throughout the                            procedure, the patient's blood pressure, pulse, and                             oxygen saturations were monitored continuously. The                            [id number]-H190 ([id number]) scope was introduced through the                            mouth, and advanced to the second part of duodenum.                            The upper GI endoscopy was accomplished without                            difficulty. The patient tolerated the procedure                            well. Scope In: 11:51:44 AM Scope Out: 11:58:06 AM Total Procedure Duration: 0 hours 6 minutes 22 seconds  Findings:      Verrucous appearing mucosa involving the distal 3 cm of tubular       esophagus. Please see photos. No ulcer or erosion seen. Mucosal       abnormality with specks would not wash off. Tubular obvious patent       throughout its course.      The entire examined stomach was normal.      The duodenal bulb and second portion of the duodenum were normal.       Biopsies of the distal esophagus taken. Impression:               - Abnormal distal esophagus of uncertain                            significance. Query pill induced injury?"status post                            biopsy.                           - Normal stomach.                           - Normal duodenal bulb and second portion of the  duodenum. Moderate Sedation:      Moderate (conscious) sedation was personally administered by an       anesthesia professional. The following parameters were monitored: oxygen       saturation, heart rate, blood pressure, respiratory rate, EKG, adequacy       of pulmonary ventilation, and response to care. Recommendation:           - Patient has a contact number available for                            emergencies. The signs and symptoms of potential                            delayed complications were discussed with the                            patient. Return to normal activities tomorrow.                            Written discharge instructions were  provided to the                            patient.                           - Advance diet as tolerated.                           - Continue present medications. Swallowing                            precautions reviewed.                           - Return to GI office in 2 months. Follow-up on                            pathology; see colonoscopy report. Procedure Code(s):        --- Professional ---                           [phone], Esophagogastroduodenoscopy, flexible,                            transoral; diagnostic, including collection of                            specimen(s) by brushing or washing, when performed                            (separate procedure) Diagnosis Code(s):        --- Professional ---                           R10.13, Epigastric pain CPT copyright 2019 American Medical Association. All rights reserved. The codes documented in this report are preliminary and upon coder review may  be revised  to meet current compliance requirements. [name], MD [name], MD 08/26/2022 12:23:13 PM This report has been signed electronically. Number of Addenda: 0

## 2022-08-26 NOTE — H&P (Signed)
_0 @   Primary Care Physician:  [facility] Primary Gastroenterologist:  Dr. Gala Romney  Pre-Procedure History & Physical: HPI:  Madeline Mccormick is a 26 y.o. female here for further evaluation of epigastric pain change in bowel habits and iron deficiency anemia via EGD and colonoscopy. No dysphagia.  No rectal bleeding.  Past Medical History:  Diagnosis Date   ADHD    previously took Vivant - stopped 01/2019   Allergy    Anxiety    Depression    History of pre-eclampsia 10/21/2019    Past Surgical History:  Procedure Laterality Date   COLPOSCOPY  08/15/2022   DENTAL SURGERY     THERAPEUTIC ABORTION      Prior to Admission medications   Medication Sig Start Date End Date Taking? Authorizing Provider  ARIPiprazole (ABILIFY) 5 MG tablet Take 1 tablet (5 mg total) by mouth daily. 08/03/22  Yes Arfeen, Arlyce Harman, MD  escitalopram (LEXAPRO) 20 MG tablet Take 1 tablet (20 mg total) by mouth daily. 08/03/22  Yes Arfeen, Arlyce Harman, MD  lisdexamfetamine (VYVANSE) 30 MG capsule Take 1 capsule (30 mg total) by mouth daily. 08/03/22  Yes Arfeen, Arlyce Harman, MD  Sod Picosulfate-Mag Ox-Cit Acd (CLENPIQ) 10-3.5-12 MG-GM -GM/175ML SOLN Take 1 kit by mouth as directed. 08/11/22  Yes Leotis Isham, Cristopher Estimable, MD  hydrocortisone (ANUSOL-HC) 2.5 % rectal cream Place 1 Application rectally 2 (two) times daily. Patient not taking: Reported on 08/24/2022 07/26/22   Sherron Monday, NP    Allergies as of 08/11/2022   (No Known Allergies)    Family History  Problem Relation Age of Onset   Depression Mother    Diabetes Mother    Thyroid disease Mother        radiation therapy   Other Mother        gastroparesis with stimulator   Arthritis Maternal Grandmother    Stroke Maternal Grandmother    Sudden death Maternal Grandmother    Colon cancer Neg Hx    Colon polyps Neg Hx    Celiac disease Neg Hx     Social History   Socioeconomic History   Marital status: Single    Spouse name: Not on file   Number  of children: Not on file   Years of education: Not on file   Highest education level: Not on file  Occupational History    Comment: unemployed  Tobacco Use   Smoking status: Every Day    Types: E-cigarettes   Smokeless tobacco: Never   Tobacco comments:    Vapes  Vaping Use   Vaping Use: Every day  Substance and Sexual Activity   Alcohol use: No    Alcohol/week: 0.0 standard drinks of alcohol   Drug use: No   Sexual activity: Yes    Birth control/protection: None  Other Topics Concern   Not on file  Social History Narrative   Not on file   Social Determinants of Health   Financial Resource Strain: Not on file  Food Insecurity: No Food Insecurity (08/15/2022)   Hunger Vital Sign    Worried About Running Out of Food in the Last Year: Never true    Ran Out of Food in the Last Year: Never true  Transportation Needs: No Transportation Needs (08/15/2022)   PRAPARE - Hydrologist (Medical): No    Lack of Transportation (Non-Medical): No  Physical Activity: Not on file  Stress: Not on file  Social Connections: Not on file  Intimate  Partner Violence: Not At Risk (04/09/2019)   Humiliation, Afraid, Rape, and Kick questionnaire    Fear of Current or Ex-Partner: No    Emotionally Abused: No    Physically Abused: No    Sexually Abused: No    Review of Systems: See HPI, otherwise negative ROS  Physical Exam: BP 112/76   Temp 97.8 F (36.6 C) (Oral)   Resp 14   Ht _0  (1.626 m)   Wt 68 kg   SpO2 100%   BMI 25.75 kg/m  General:   Alert,  Well-developed, well-nourished, pleasant and cooperative in NAD Neck:  Supple; no masses or thyromegaly. No significant cervical adenopathy. Lungs:  Clear throughout to auscultation.   No wheezes, crackles, or rhonchi. No acute distress. Heart:  Regular rate and rhythm; no murmurs, clicks, rubs,  or gallops. Abdomen: Non-distended, normal bowel sounds.  Soft and nontender without appreciable mass or  hepatosplenomegaly.  Pulses:  Normal pulses noted. Extremities:  Without clubbing or edema.  Impression/Plan: 26 year old lady with epigastric, pain iron deficiency anemia in bowel habits. Per plan, she has been offered a diagnostic EGD and colonoscopy.  The risks, benefits, limitations, imponderables and alternatives regarding both EGD and colonoscopy have been reviewed with the patient. Questions have been answered. All parties agreeable.       Notice: This dictation was prepared with Dragon dictation along with smaller phrase technology. Any transcriptional errors that result from this process are unintentional and may not be corrected upon review.

## 2022-08-29 LAB — SURGICAL PATHOLOGY

## 2022-09-02 ENCOUNTER — Encounter (HOSPITAL_COMMUNITY): Payer: Self-pay | Admitting: Internal Medicine

## 2022-09-04 ENCOUNTER — Encounter: Payer: Self-pay | Admitting: Internal Medicine

## 2022-09-05 ENCOUNTER — Other Ambulatory Visit: Payer: Self-pay

## 2022-09-05 MED ORDER — PANTOPRAZOLE SODIUM 40 MG PO TBEC: 40.0000 mg | DELAYED_RELEASE_TABLET | Freq: Every day | ORAL | 11 refills | Status: DC

## 2022-09-06 ENCOUNTER — Telehealth (HOSPITAL_COMMUNITY): Payer: Self-pay | Admitting: *Deleted

## 2022-09-06 ENCOUNTER — Other Ambulatory Visit (HOSPITAL_COMMUNITY): Payer: Self-pay | Admitting: Psychiatry

## 2022-09-06 DIAGNOSIS — F902 Attention-deficit hyperactivity disorder, combined type: Secondary | ICD-10-CM

## 2022-09-06 MED ORDER — LISDEXAMFETAMINE DIMESYLATE 30 MG PO CAPS: 30.0000 mg | ORAL_CAPSULE | Freq: Every day | ORAL | 0 refills | Status: DC

## 2022-09-06 NOTE — Telephone Encounter (Signed)
Patient Called Requested Refill-- lisdexamfetamine (VYVANSE) 30 MG capsule Take 1 capsule (30 mg total) by mouth daily

## 2022-09-06 NOTE — Telephone Encounter (Signed)
Per Dr Harrington Challenger-- Refill Sent, she need appt with Dr. Billy Fischer had to LVM  to inform of the above

## 2022-09-06 NOTE — Telephone Encounter (Signed)
Sent, she need appt with Dr. Adele Schilder

## 2022-09-19 ENCOUNTER — Telehealth: Payer: Self-pay | Admitting: *Deleted

## 2022-09-19 NOTE — Telephone Encounter (Signed)
Called and lvm of scheduling appointment with Judson Roch - requested callback to confirm

## 2022-09-29 ENCOUNTER — Inpatient Hospital Stay: Payer: Medicaid Other

## 2022-09-29 ENCOUNTER — Ambulatory Visit: Payer: Medicaid Other | Admitting: Gastroenterology

## 2022-09-29 ENCOUNTER — Encounter: Payer: Self-pay | Admitting: Medical Oncology

## 2022-09-29 ENCOUNTER — Telehealth: Payer: Self-pay | Admitting: *Deleted

## 2022-09-29 ENCOUNTER — Inpatient Hospital Stay: Payer: Medicaid Other | Attending: Hematology & Oncology | Admitting: Medical Oncology

## 2022-09-29 VITALS — BP 117/81 | HR 92 | Temp 98.2°F | Resp 17 | Wt 147.1 lb

## 2022-09-29 DIAGNOSIS — D509 Iron deficiency anemia, unspecified: Secondary | ICD-10-CM | POA: Diagnosis not present

## 2022-09-29 DIAGNOSIS — K200 Eosinophilic esophagitis: Secondary | ICD-10-CM | POA: Diagnosis not present

## 2022-09-29 DIAGNOSIS — R233 Spontaneous ecchymoses: Secondary | ICD-10-CM | POA: Diagnosis present

## 2022-09-29 DIAGNOSIS — E538 Deficiency of other specified B group vitamins: Secondary | ICD-10-CM | POA: Diagnosis not present

## 2022-09-29 DIAGNOSIS — D649 Anemia, unspecified: Secondary | ICD-10-CM | POA: Diagnosis present

## 2022-09-29 LAB — CBC WITH DIFFERENTIAL (CANCER CENTER ONLY)
Abs Immature Granulocytes: 0.05 10*3/uL (ref 0.00–0.07)
Basophils Absolute: 0 10*3/uL (ref 0.0–0.1)
Basophils Relative: 0 %
Eosinophils Absolute: 0.1 10*3/uL (ref 0.0–0.5)
Eosinophils Relative: 2 %
HCT: 39.2 % (ref 36.0–46.0)
Hemoglobin: 13 g/dL (ref 12.0–15.0)
Immature Granulocytes: 1 %
Lymphocytes Relative: 31 %
Lymphs Abs: 1.5 10*3/uL (ref 0.7–4.0)
MCH: 31.3 pg (ref 26.0–34.0)
MCHC: 33.2 g/dL (ref 30.0–36.0)
MCV: 94.2 fL (ref 80.0–100.0)
Monocytes Absolute: 0.5 10*3/uL (ref 0.1–1.0)
Monocytes Relative: 10 %
Neutro Abs: 2.6 10*3/uL (ref 1.7–7.7)
Neutrophils Relative %: 56 %
Platelet Count: 299 10*3/uL (ref 150–400)
RBC: 4.16 MIL/uL (ref 3.87–5.11)
RDW: 11.7 % (ref 11.5–15.5)
WBC Count: 4.7 10*3/uL (ref 4.0–10.5)
nRBC: 0 % (ref 0.0–0.2)

## 2022-09-29 LAB — VITAMIN B12: Vitamin B-12: 148 pg/mL — ABNORMAL LOW (ref 180–914)

## 2022-09-29 LAB — RETICULOCYTES
Immature Retic Fract: 3.8 % (ref 2.3–15.9)
RBC.: 4.13 MIL/uL (ref 3.87–5.11)
Retic Count, Absolute: 43 10*3/uL (ref 19.0–186.0)
Retic Ct Pct: 1 % (ref 0.4–3.1)

## 2022-09-29 LAB — FERRITIN: Ferritin: 66 ng/mL (ref 11–307)

## 2022-09-29 LAB — IRON AND IRON BINDING CAPACITY (CC-WL,HP ONLY)
Iron: 107 ug/dL (ref 28–170)
Saturation Ratios: 35 % — ABNORMAL HIGH (ref 10.4–31.8)
TIBC: 308 ug/dL (ref 250–450)
UIBC: 201 ug/dL (ref 148–442)

## 2022-09-29 MED ORDER — OMEPRAZOLE 20 MG PO CPDR: 20.0000 mg | DELAYED_RELEASE_CAPSULE | Freq: Every day | ORAL | 3 refills | Status: DC

## 2022-09-29 MED ORDER — FLUTICASONE PROPIONATE HFA 220 MCG/ACT IN AERO: INHALATION_SPRAY | RESPIRATORY_TRACT | 12 refills | Status: DC

## 2022-09-29 NOTE — Progress Notes (Signed)
Hematology and Oncology Follow Up Visit  Madeline Mccormick [id number] 1996/06/10 26 y.o. 09/29/2022   Principle Diagnosis:  Iron deficiency Bruising    Current Therapy:        IV iron as indicated    Interim History:  [name] is here today for follow-up. She is symptomatic with fatigue.  Since her last visit she saw GI and had a colonoscopy and endoscopy. She reports that her colonoscopy was good but her endoscopy showed irritation. She reports that they started her on Protonix 40 mg which she took just once- it made her nauseated so she stopped this. Her follow up is in about 2 weeks.  She no longer has an IUD as this was accidentally removed by patient  No other blood loss noted. Bruising is stable but continues to bother her at times. No petechiae.  No fever, chills, cough, rash, dizziness, SOB, chest pain, palpitations or changes in bladder habits.  No swelling, tenderness, numbness or tingling in her extremities.  No falls or syncope.  Her appetite comes and goes. She is doing her best to stay well hydrated.  Her weight is down slightly at 142 lbs from not eating as much due to discomfort.  Wt Readings from Last 3 Encounters:  09/29/22 147 lb 1.9 oz (66.7 kg)  08/26/22 150 lb (68 kg)  08/15/22 146 lb 3.2 oz (66.3 kg)    ECOG Performance Status: 1 - Symptomatic but completely ambulatory  Medications:  Allergies as of 09/29/2022   No Known Allergies      Medication List        Accurate as of September 29, 2022  2:12 PM. If you have any questions, ask your nurse or doctor.          STOP taking these medications    pantoprazole 40 MG tablet Commonly known as: PROTONIX Stopped by: Hughie Closs, PA-C       TAKE these medications    ARIPiprazole 5 MG tablet Commonly known as: Abilify Take 1 tablet (5 mg total) by mouth daily.   Clenpiq 10-3.5-12 MG-GM -GM/175ML Soln Generic drug: Sod Picosulfate-Mag Ox-Cit Acd Take 1 kit by mouth as directed.    escitalopram 20 MG tablet Commonly known as: LEXAPRO Take 1 tablet (20 mg total) by mouth daily.   fluticasone 220 MCG/ACT inhaler Commonly known as: FLOVENT HFA Once every morning, Hold your breath. Dispense 2 puffs of the inhaler in your mouth and swallow. Follow with a glass of water. Repeat at night time Started by: Hughie Closs, PA-C   hydrocortisone 2.5 % rectal cream Commonly known as: Anusol-HC Place 1 Application rectally 2 (two) times daily.   lisdexamfetamine 30 MG capsule Commonly known as: Vyvanse Take 1 capsule (30 mg total) by mouth daily.   Melatonin 5 MG Chew Chew by mouth at bedtime.   omeprazole 20 MG capsule Commonly known as: PRILOSEC Take 1 capsule (20 mg total) by mouth daily. Started by: Hughie Closs, PA-C        Allergies: No Known Allergies  Past Medical History, Surgical history, Social history, and Family History were reviewed and updated.  Review of Systems: All other 10 point review of systems is negative.   Physical Exam:  weight is 147 lb 1.9 oz (66.7 kg). Her oral temperature is 98.2 F (36.8 C). Her blood pressure is 117/81 and her pulse is 92. Her respiration is 17 and oxygen saturation is 100%.   Wt Readings from Last 3 Encounters:  09/29/22  147 lb 1.9 oz (66.7 kg)  08/26/22 150 lb (68 kg)  08/15/22 146 lb 3.2 oz (66.3 kg)    Ocular: Sclerae unicteric, pupils equal, round and reactive to light Ear-nose-throat: Oropharynx clear, dentition fair Lymphatic: No cervical or supraclavicular adenopathy Lungs no rales or rhonchi, good excursion bilaterally Heart regular rate and rhythm, no murmur appreciated Abd soft, nontender, positive bowel sounds MSK no focal spinal tenderness, no joint edema Neuro: non-focal, well-oriented, appropriate affect  Lab Results  Component Value Date   WBC 4.7 09/29/2022   HGB 13.0 09/29/2022   HCT 39.2 09/29/2022   MCV 94.2 09/29/2022   PLT 299 09/29/2022   Lab Results  Component  Value Date   FERRITIN 115 06/30/2022   IRON 113 06/30/2022   TIBC 258 06/30/2022   UIBC 145 (L) 06/30/2022   IRONPCTSAT 44 (H) 06/30/2022   Lab Results  Component Value Date   RETICCTPCT 1.0 09/29/2022   RBC 4.16 09/29/2022   RBC 4.13 09/29/2022   No results found for: "KPAFRELGTCHN", "LAMBDASER", "KAPLAMBRATIO" No results found for: "IGGSERUM", "IGA", "IGMSERUM" No results found for: "TOTALPROTELP", "ALBUMINELP", "A1GS", "A2GS", "BETS", "BETA2SER", "GAMS", "MSPIKE", "SPEI"   Chemistry      Component Value Date/Time   NA 136 08/05/2022 1440   K 3.6 08/05/2022 1440   CL 107 08/05/2022 1440   CO2 22 08/05/2022 1440   BUN 11 08/05/2022 1440   CREATININE 0.63 08/05/2022 1440   CREATININE 0.73 02/15/2022 1049      Component Value Date/Time   CALCIUM 8.7 (L) 08/05/2022 1440   ALKPHOS 47 08/05/2022 1440   AST 12 (L) 08/05/2022 1440   AST 11 (L) 02/15/2022 1049   ALT 7 08/05/2022 1440   ALT 6 02/15/2022 1049   BILITOT 0.4 08/05/2022 1440   BILITOT 0.4 02/15/2022 1049       Impression and Plan: [name] is a very pleasant 26 yo caucasian female with intermittent iron deficiency anemia.  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 Not currently being treated. Starting her on Omeprazole 20 mg which she may tolerate better than Protonix 40 mg. Adding Fluticasone. Keep follow up with GI Follow-up in 3 months.   Hughie Closs, PA-C 10/12/20232:12 PM

## 2022-09-29 NOTE — Patient Instructions (Addendum)
Eosinophilic esophagitis   Starting you on omeprazole 20 mg (acid reducer)- take this first thing in the morning on an empty stomach OR right before bed if you tolerate this better.   Also prescribing you Fluticasone (steroid medication). This comes in an inhaler form for people with lung disease but you are going to use this differently. I want you to hold you breath(instead of breathin in the medication which is how people with lung disease use it), put two puff of the inhaler in your mouth then swallow, then breathe. Follow this by a drinking large glass of water.    Keep you follow up with gastroenterology.   Please keep me in the loop over the next 1-2 weeks with how you are feeling- hopefully much better!  -Nelwyn Salisbury PA-C

## 2022-09-29 NOTE — Telephone Encounter (Signed)
Per 09/29/22 los - gave upcoming appointments - confirmed 

## 2022-09-30 ENCOUNTER — Ambulatory Visit: Payer: Medicaid Other | Admitting: Family

## 2022-09-30 ENCOUNTER — Inpatient Hospital Stay: Payer: Medicaid Other

## 2022-10-03 ENCOUNTER — Encounter: Payer: Self-pay | Admitting: *Deleted

## 2022-10-10 NOTE — Progress Notes (Deleted)
GI Office Note    Referring Provider: Practice, [place] Primary Care Physician:  P[facility], [facility] Primary Gastroenterologist: Cristopher Estimable.Rourk, MD   Date:  10/10/2022  ID:  Madeline Mccormick, DOB 1996-07-26, MRN [id number]   Chief Complaint   No chief complaint on file.   History of Present Illness  Madeline Mccormick is a 26 y.o. female with a history of ADHD, anxiety, depression, anemia and weight loss following with hematology, and preeclampsia in 2010*** presenting today for follow-up of multiple GI issues.  Last seen in the clinic for initial consultation 07/26/2022.  Reports she has found to be anemic after resuming multiple bruising.  Previously getting iron infusions with hematology/oncology.  She reported issues with a painful hemorrhoid with pressure and itching without any bleeding.  Reported the suppositories were not helpful.  Reported lack of energy and some insomnia.  Complaining of postprandial abdominal pain mostly in the lower abdomen and right lower quadrant.  Pain usually last for about 20 minutes and is very intense/crampy.  Denied any association with any particular foods.  Does have some epigastric discomfort feeling food sitting in her stomach.  Does admit to some heartburn and a lack of appetite, needing to force herself to eat.  Had been nauseous with a few episodes of vomiting and reported a 15-20 pound weight loss since January.  Does report a poor diet as she works at [employer] therefore is eating fried chicken and [place] fries [employer] cases.  I recently tried increasing her intake of fruit and did admit by drinking water.  Reports that time she has constipation and the may have a couple weeks of diarrhea.  Stated sometimes her stools are yellow and may have some mucus in her stools.  Denies any nocturnal stools.  Stated her mother had history of gastroparesis and lower stomach problems.  Labs were obtained including celiac serologies and CMP.  Abdominal  ultrasound ordered, given Anusol rectal cream and advised to increase water intake.  Labs 07/26/2022: celiac serologies negative, CMP with normal renal and hepatic function.  Abdominal ultrasound 08/02/2022: Sludge in gallbladder without cholelithiasis or cholecystitis.  Noted to have 2-3 gallbladder polyps measuring 5 mm.  No follow-up imaging recommended.  Patient recommended to have upper endoscopy given her upper GI symptoms negative ultrasound.  Since she was going to be undergoing an upper endoscopy, colonoscopy was also ordered given her alternating bowel habits and her anemia.  EGD 08/26/2022: Abnormal distal esophagus with verrucous appearing mucosa without any ulceration or erosions.  Abnormal specks that were unable to be washed off.  Esophagus appeared patent.  Normal stomach and duodenum.  Concern for possible pill induced esophagitis.  Esophageal biopsies consistent with eosinophilic esophagitis with reactive changes in the mucosa.  No malignancy identified.  Patient advised on swallowing precautions.  Advised to start on pantoprazole 40 mg daily, 30 minutes prior to breakfast.  Colonoscopy 08/26/2022: Entire examined colon normal.  Nonbleeding internal hemorrhoids.  Normal TI.  Recommending repeat colonoscopy at age 89 for screening purposes.  Recent follow-up with hematology/oncology 09/29/2022.  She reported that she only took her pantoprazole 40 mg once as she reports it made her nauseous.  She denied any more frequent bruising, petechiae, shortness of breath, chest pain, palpitations, or change in bladder habits.  She reported her appetite comes and goes and she is doing her best to stay hydrated.  She stated her weight was slightly down to 142 pounds due to not eating much.  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  Given that she was not taking Protonix, she was started on omeprazole 20 mg daily and was given prescription for inhaled  fluticasone.  Advised to follow-up in the clinic in 3 months.  Labs 09/29/2022: Iron 107, iron saturation high at 35%, ferritin within normal limits.  Hemoglobin 13.  Low B12.   Today:   Weight readings: 10/11/2022   09/29/2022  147 pounds 08/26/2022      150 pounds 08/15/2022    146 pounds     Current Outpatient Medications  Medication Sig Dispense Refill   ARIPiprazole (ABILIFY) 5 MG tablet Take 1 tablet (5 mg total) by mouth daily. 90 tablet 0   escitalopram (LEXAPRO) 20 MG tablet Take 1 tablet (20 mg total) by mouth daily. 90 tablet 0   fluticasone (FLOVENT HFA) 220 MCG/ACT inhaler Once every morning, Hold your breath. Dispense 2 puffs of the inhaler in your mouth and swallow. Follow with a glass of water. Repeat at night time 1 each 12   hydrocortisone (ANUSOL-HC) 2.5 % rectal cream Place 1 Application rectally 2 (two) times daily. 30 g 1   lisdexamfetamine (VYVANSE) 30 MG capsule Take 1 capsule (30 mg total) by mouth daily. 30 capsule 0   Melatonin 5 MG CHEW Chew by mouth at bedtime.     omeprazole (PRILOSEC) 20 MG capsule Take 1 capsule (20 mg total) by mouth daily. 30 capsule 3   Sod Picosulfate-Mag Ox-Cit Acd (CLENPIQ) 10-3.5-12 MG-GM -GM/175ML SOLN Take 1 kit by mouth as directed. 350 mL 0   No current facility-administered medications for this visit.   Facility-Administered Medications Ordered in Other Visits  Medication Dose Route Frequency Provider Last Rate Last Admin   sodium chloride flush (NS) 0.9 % injection 10 mL  10 mL Intracatheter Once PRN Celso Amy, NP       sodium chloride flush (NS) 0.9 % injection 3 mL  3 mL Intracatheter Once PRN Celso Amy, NP        Past Medical History:  Diagnosis Date   ADHD    previously took Vivant - stopped 01/2019   Allergy    Anxiety    Depression    History of pre-eclampsia 10/21/2019    Past Surgical History:  Procedure Laterality Date   BIOPSY  08/26/2022   Procedure: BIOPSY;  Surgeon: Daneil Dolin, MD;   [facility];  Service: Endoscopy;;   COLONOSCOPY WITH PROPOFOL N/A 08/26/2022   Procedure: COLONOSCOPY WITH PROPOFOL;  Surgeon: Daneil Dolin, MD;  [facility];  Service: Endoscopy;  Laterality: N/A;  11:15am   COLPOSCOPY  08/15/2022   DENTAL SURGERY     ESOPHAGOGASTRODUODENOSCOPY (EGD) WITH PROPOFOL N/A 08/26/2022   Procedure: ESOPHAGOGASTRODUODENOSCOPY (EGD) WITH PROPOFOL;  Surgeon: Daneil Dolin, MD;  [facility];  Service: Endoscopy;  Laterality: N/A;   THERAPEUTIC ABORTION      Family History  Problem Relation Age of Onset   Depression Mother    Diabetes Mother    Thyroid disease Mother        radiation therapy   Other Mother        gastroparesis with stimulator   Arthritis Maternal Grandmother    Stroke Maternal Grandmother    Sudden death Maternal Grandmother    Colon cancer Neg Hx    Colon polyps Neg Hx    Celiac disease Neg Hx     Allergies as of 10/11/2022   (  No Known Allergies)    Social History   Socioeconomic History   Marital status: Single    Spouse name: Not on file   Number of children: Not on file   Years of education: Not on file   Highest education level: Not on file  Occupational History    Comment: unemployed  Tobacco Use   Smoking status: Every Day    Types: E-cigarettes   Smokeless tobacco: Never   Tobacco comments:    Vapes  Vaping Use   Vaping Use: Every day  Substance and Sexual Activity   Alcohol use: No    Alcohol/week: 0.0 standard drinks of alcohol   Drug use: No   Sexual activity: Yes    Birth control/protection: None  Other Topics Concern   Not on file  Social History Narrative   Not on file   Social Determinants of Health   Financial Resource Strain: Not on file  Food Insecurity: No Food Insecurity (08/15/2022)   Hunger Vital Sign    Worried About Running Out of Food in the Last Year: Never true    Ran Out of Food in the Last Year: Never true  Transportation Needs: No Transportation  Needs (08/15/2022)   PRAPARE - Hydrologist (Medical): No    Lack of Transportation (Non-Medical): No  Physical Activity: Not on file  Stress: Not on file  Social Connections: Not on file     Review of Systems   Gen: Denies fever, chills, anorexia. Denies fatigue, weakness, weight loss.  CV: Denies chest pain, palpitations, syncope, peripheral edema, and claudication. Resp: Denies dyspnea at rest, cough, wheezing, coughing up blood, and pleurisy. GI: See HPI Derm: Denies rash, itching, dry skin Psych: Denies depression, anxiety, memory loss, confusion. No homicidal or suicidal ideation.  Heme: Denies bruising, bleeding, and enlarged lymph nodes.   Physical Exam   There were no vitals taken for this visit.  General:   Alert and oriented. No distress noted. Pleasant and cooperative.  Head:  Normocephalic and atraumatic. Eyes:  Conjuctiva clear without scleral icterus. Mouth:  Oral mucosa pink and moist. Good dentition. No lesions. Lungs:  Clear to auscultation bilaterally. No wheezes, rales, or rhonchi. No distress.  Heart:  S1, S2 present without murmurs appreciated.  Abdomen:  +BS, soft, non-tender and non-distended. No rebound or guarding. No HSM or masses noted. Rectal: *** Msk:  Symmetrical without gross deformities. Normal posture. Extremities:  Without edema. Neurologic:  Alert and  oriented x4 Psych:  Alert and cooperative. Normal mood and affect.   Assessment  Madeline Mccormick is a 26 y.o. female with a history of ADHD, anxiety, depression, anemia and weight loss following with hematology, and preeclampsia in 2010*** presenting today for follow-up of multiple GI issues.  GERD, eosinophilic esophagitis:   Anemia:   Abdominal pain, weight loss, alternating diarrhea and constipation, N/V:    PLAN   *** CT versus GES Continue omeprazole 20 mg daily. Continue swallowed fluticasone??    Venetia Night, MSN, FNP-BC,  AGACNP-BC [facility]

## 2022-10-11 ENCOUNTER — Encounter: Payer: Self-pay | Admitting: Gastroenterology

## 2022-10-11 ENCOUNTER — Ambulatory Visit: Payer: Medicaid Other | Admitting: Gastroenterology

## 2022-10-16 NOTE — Progress Notes (Unsigned)
Primary Care Physician:  [facility]  Primary Gastroenterologist: Gerrit Friends. Rourk, MD  Patient Location: Home Reason for Visit: follow up hemorrhoids, GERD, diarrhea, and abdominal pain  Persons present on the virtual encounter, with roles: Patient - Madeline Mccormick; Provider - Brooke Bonito, NP   Total time (minutes) spent on medical discussion: 19 minutes  Virtual Visit Encounter Note Visit is conducted virtually and was requested by patient.   I connected with Madeline Mccormick on 10/17/22 at  3:30 PM EDT by video and verified that I am speaking with the correct person using two identifiers.   I discussed the limitations, risks, security and privacy concerns of performing an evaluation and management service by video and the availability of in person appointments. I also discussed with the patient that there may be a patient responsible charge related to this service. The patient expressed understanding and agreed to proceed.  Chief Complaint  Patient presents with   Follow-up    Pt is having issues with hemorrhoids and discuss medication.    History of Present Illness: Madeline Mccormick is a 26 y.o. female with a history of ADHD, anxiety, depression, anemia and weight loss following with hematology, and preeclampsia in 2010 presenting today for follow-up of multiple GI issues.   Last seen in the clinic for initial consultation 07/26/2022.  Reports she has found to be anemic after resuming multiple bruising.  Previously getting iron infusions with hematology/oncology.  She reported issues with a painful hemorrhoid with pressure and itching without any bleeding.  Reported the suppositories were not helpful.  Reported lack of energy and some insomnia.  Complaining of postprandial abdominal pain mostly in the lower abdomen and right lower quadrant.  Pain usually last for about 20 minutes and is very intense/crampy.  Denied any association with any particular foods.  Does have some  epigastric discomfort feeling food sitting in her stomach.  Does admit to some heartburn and a lack of appetite, needing to force herself to eat.  Had been nauseous with a few episodes of vomiting and reported a 15-20 pound weight loss since January.  Does report a poor diet as she works at [employer] therefore is eating fried chicken and [place] fries [employer] cases.  I recently tried increasing her intake of fruit and did admit by drinking water.  Reports that time she has constipation and the may have a couple weeks of diarrhea.  Stated sometimes her stools are yellow and may have some mucus in her stools.  Denies any nocturnal stools.  Stated her mother had history of gastroparesis and lower stomach problems.  Labs were obtained including celiac serologies and CMP.  Abdominal ultrasound ordered, given Anusol rectal cream and advised to increase water intake.   Labs 07/26/2022: celiac serologies negative, CMP with normal renal and hepatic function.   Abdominal ultrasound 08/02/2022: Sludge in gallbladder without cholelithiasis or cholecystitis.  Noted to have 2-3 gallbladder polyps measuring 5 mm.  No follow-up imaging recommended.  Patient recommended to have upper endoscopy given her upper GI symptoms negative ultrasound.  Since she was going to be undergoing an upper endoscopy, colonoscopy was also ordered given her alternating bowel habits and her anemia.   EGD 08/26/2022: Abnormal distal esophagus with verrucous appearing mucosa without any ulceration or erosions.  Abnormal specks that were unable to be washed off.  Esophagus appeared patent.  Normal stomach and duodenum.  Concern for possible pill induced esophagitis.  Esophageal biopsies consistent with eosinophilic esophagitis with reactive changes  in the mucosa.  No malignancy identified.  Patient advised on swallowing precautions.  Advised to start on pantoprazole 40 mg daily, 30 minutes prior to breakfast.   Colonoscopy 08/26/2022: Entire examined  colon normal.  Nonbleeding internal hemorrhoids. Normal TI.  Recommending repeat colonoscopy at age 26 for screening purposes.   Recent follow-up with hematology/oncology 09/29/2022.  She reported that she only took her pantoprazole 40 mg once as she reports it made her nauseous.  She denied any more frequent bruising, petechiae, shortness of breath, chest pain, palpitations, or change in bladder habits.  She reported her appetite comes and goes and she is doing her best to stay hydrated.  She stated her weight was slightly down to 142 pounds due to not eating much.  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  Given that she was not taking Protonix, she was started on omeprazole 20 mg daily and was given prescription for inhaled fluticasone.  Advised to follow-up in the clinic in 3 months.   Labs 09/29/2022: Iron 107, iron saturation high at 35%, ferritin within normal limits.  Hemoglobin 13.  Low B12.     Today: Hemorrhoids: did not do well with the anusol cream. Still having a lot of itching. No bleeding. Denies any burning or rectal pain. Still having a fair amount of diarrhea.   Diarrhea: post prandial. Still having abdominal cramping before she has a bowel movement and also occurs post prandially. No fecal incontinence. Gallbladder remains in place. Not taking any imodium or pepto bismol.  Denies any constipation. Denies melena or BRBPR. Still has a lack of appetite. Still eating fried foods. Does have some nausea but no vomiting.   Does have some occasional heartburn but its not happening with each meal.  Currently taking Vitamin B12 and Vitamin D supplementation.    Weight readings: 10/11/2022  148 pounds 09/29/2022  147 pounds 08/26/2022      150 pounds 08/15/2022    146 pounds     Medications Current Meds  Medication Sig   ARIPiprazole (ABILIFY) 5 MG tablet Take 1 tablet (5 mg total) by mouth daily.   escitalopram (LEXAPRO)  20 MG tablet Take 1 tablet (20 mg total) by mouth daily.   fluticasone (FLOVENT HFA) 220 MCG/ACT inhaler Once every morning, Hold your breath. Dispense 2 puffs of the inhaler in your mouth and swallow. Follow with a glass of water. Repeat at night time   lisdexamfetamine (VYVANSE) 30 MG capsule Take 1 capsule (30 mg total) by mouth daily.   Melatonin 5 MG CHEW Chew by mouth at bedtime.   omeprazole (PRILOSEC) 20 MG capsule Take 20 mg by mouth daily.     History Past Medical History:  Diagnosis Date   ADHD    previously took Vivant - stopped 01/2019   Allergy    Anxiety    Depression    History of pre-eclampsia 10/21/2019    Past Surgical History:  Procedure Laterality Date   BIOPSY  08/26/2022   Procedure: BIOPSY;  Surgeon: Daneil Dolin, MD;  [facility];  Service: Endoscopy;;   COLONOSCOPY WITH PROPOFOL N/A 08/26/2022   Procedure: COLONOSCOPY WITH PROPOFOL;  Surgeon: Daneil Dolin, MD;  [facility];  Service: Endoscopy;  Laterality: N/A;  11:15am   COLPOSCOPY  08/15/2022   DENTAL SURGERY     ESOPHAGOGASTRODUODENOSCOPY (EGD) WITH PROPOFOL N/A 08/26/2022   Procedure: ESOPHAGOGASTRODUODENOSCOPY (EGD) WITH PROPOFOL;  Surgeon: Daneil Dolin, MD;  [facility];  Service: Endoscopy;  Laterality: N/A;   THERAPEUTIC ABORTION      Family History  Problem Relation Age of Onset   Depression Mother    Diabetes Mother    Thyroid disease Mother        radiation therapy   Other Mother        gastroparesis with stimulator   Arthritis Maternal Grandmother    Stroke Maternal Grandmother    Sudden death Maternal Grandmother    Colon cancer Neg Hx    Colon polyps Neg Hx    Celiac disease Neg Hx     Social History   Socioeconomic History   Marital status: Single    Spouse name: Not on file   Number of children: Not on file   Years of education: Not on file   Highest education level: Not on file  Occupational History    Comment: unemployed  Tobacco  Use   Smoking status: Every Day    Types: E-cigarettes   Smokeless tobacco: Never   Tobacco comments:    Vapes  Vaping Use   Vaping Use: Every day  Substance and Sexual Activity   Alcohol use: No    Alcohol/week: 0.0 standard drinks of alcohol   Drug use: No   Sexual activity: Yes    Birth control/protection: None  Other Topics Concern   Not on file  Social History Narrative   Not on file   Social Determinants of Health   Financial Resource Strain: Not on file  Food Insecurity: No Food Insecurity (08/15/2022)   Hunger Vital Sign    Worried About Running Out of Food in the Last Year: Never true    Ran Out of Food in the Last Year: Never true  Transportation Needs: No Transportation Needs (08/15/2022)   PRAPARE - Administrator, Civil Service (Medical): No    Lack of Transportation (Non-Medical): No  Physical Activity: Not on file  Stress: Not on file  Social Connections: Not on file      Review of Systems: Gen: Denies fever, chills, anorexia. Denies fatigue, weakness, weight loss.  CV: Denies chest pain, palpitations, syncope, peripheral edema, and claudication. Resp: Denies dyspnea at rest, cough, wheezing, coughing up blood, and pleurisy. GI: see HPI Derm: Denies rash, itching, dry skin Psych: Denies depression, anxiety, memory loss, confusion. No homicidal or suicidal ideation.  Heme: Denies bruising, bleeding, and enlarged lymph nodes.  Observations/Objective: No distress. Alert and oriented. Pleasant. Well nourished. Normal mood and affect. Unable to perform complete physical exam due to video encounter.   Assessment: Madeline Mccormick is a 26 y.o. female with a history of ADHD, anxiety, depression, anemia and weight loss following with hematology, and preeclampsia in 2010 presenting today for follow-up of multiple GI issues.   GERD, eosinophilic esophagitis: Did not previously tolerate pantoprazole due to nausea. Has been taking omeprazole 20 mg daily  without any side effects. Reinforced GERD diet. Continue swallowed fluticasone, education provided on proper administration. Advised to avoid lactose.    Anemia: History of anemia since pregnancy in 2020. Hgb range 11.7-13. (Most recently 13 on 09/29/22). Prior labs with low B12 and folate. Has been following with hematology for anemia and prior weight loss. Component of anemia likely secondary to heavy menstrual cycles. EGD with EoE, normal stomach, nad normal duodenum. Colonoscopy normal aside from internal hemorrhoids.    Abdominal pain, Nausea: Primarily located in the lower abdomen and is postprandial and is associated with diarrhea. Weight  is stable. Has been ongoing since April. Has occasional nausea as well. Epigastric pain is slightly improved. Abdominal ultrasound with presence of gallbladder sludge and polyps without cholecystitis. Prior workup negative for celiac and last CMP with normal renal and hepatic function. Suspect abdominal pain as a component of IBS but given her esophagitis unable to rule this out as contributory cause or biliary dyskinesia. Given her family history and her associated nausea and lack of appetite, gastroparesis also remains in the differential. If pain continued despite IBS diarrhea treatment may need to evaluate with CT A/P vs HIDA vs GES.   IBS, diarrhea predominant: Lower abdominal pain post prandially with predominantly diarrhea.  BM after each meal. Previously reported some intermittent constipation but has been experiencing diarrhea after every meal. Continues to have lack of appetite and occasional nausea. Workup thus far negative for celiac. Possibly secondary to biliary dyskinesia however no evidence of chololithiasis or cholecystitis and normal LFTs and pain primarily located in lower abdomen. Gallbladder in situ therefore we will trial Viberzi 75 mg BID and if pain/stools not improved will obtain further abdominal imaging. Should obtain lipase and TSH for  initial workup.   Hemorrhoids: Continue anti hemorrhoid cream over the counter. May use zinc based cream externally only for relief. May be a good hemorrhoid banding candidate.   Plan:  Plan to check Lipase and TSH Continue omeprazole 20 mg daily. Continue swallowed fluticasone Viberzi 75 mg daily, samples placed at the front desk for patient to pick up.  Avoid fatty/greasy foods.  Avoid lactose.  Could consider CT vs HIDA vs GES if symptoms continue.  Continue OTC any hemorrhoid cream Zinc based cream to perianal area if desired (not internally).  Discuss hemorrhoid banding at follow up.  Follow up in 2 months.     Follow Up Instructions:  I discussed the assessment and treatment plan with the patient. The patient was provided an opportunity to ask questions and all were answered. The patient agreed with the plan and demonstrated an understanding of the instructions.   The patient was advised to call back or seek an in-person evaluation if the symptoms worsen or if the condition fails to improve as anticipated.    Brooke Bonito, MSN, APRN, FNP-BC, AGACNP-BC [facility]

## 2022-10-17 ENCOUNTER — Telehealth: Payer: Self-pay

## 2022-10-17 ENCOUNTER — Encounter: Payer: Self-pay | Admitting: Gastroenterology

## 2022-10-17 ENCOUNTER — Telehealth (INDEPENDENT_AMBULATORY_CARE_PROVIDER_SITE_OTHER): Payer: Medicaid Other | Admitting: Gastroenterology

## 2022-10-17 VITALS — Ht 64.0 in | Wt 148.0 lb

## 2022-10-17 DIAGNOSIS — K641 Second degree hemorrhoids: Secondary | ICD-10-CM

## 2022-10-17 DIAGNOSIS — D508 Other iron deficiency anemias: Secondary | ICD-10-CM | POA: Diagnosis not present

## 2022-10-17 DIAGNOSIS — R109 Unspecified abdominal pain: Secondary | ICD-10-CM | POA: Diagnosis not present

## 2022-10-17 DIAGNOSIS — R110 Nausea: Secondary | ICD-10-CM

## 2022-10-17 DIAGNOSIS — K580 Irritable bowel syndrome with diarrhea: Secondary | ICD-10-CM

## 2022-10-17 DIAGNOSIS — K2100 Gastro-esophageal reflux disease with esophagitis, without bleeding: Secondary | ICD-10-CM

## 2022-10-17 DIAGNOSIS — K200 Eosinophilic esophagitis: Secondary | ICD-10-CM

## 2022-10-17 NOTE — Patient Instructions (Addendum)
I am providing you some samples of Viberzi 75 mg twice daily. This is to help manage your IBS diarrhea predominant symptoms. Would be best taken with food to avoid any nausea. You may come by the office to pick these up. After you have trialed the medication we can send in prescription if it is helpful.  Hold in the setting of constipation. Stop medication if you experience any significant dizziness or lightheadedness.   For your GERD/Eosinophilic esophagitis: Continue omeprazole 20 mg daily.  Continue swallowed fluticasone inhaler daily for 1 month and then monitor for worsening upper abdominal discomfort or worsening heartburn symptoms. It is best to spray a puff of medication in your mouth and hold for 1 minute and then swallow. Do not eat or drink for 30 minutes after administration.  Follow a GERD diet:  Avoid fried, fatty, greasy, spicy, citrus foods. Avoid caffeine and carbonated beverages. Avoid chocolate. Try eating 4-6 small meals a day rather than 3 large meals. Do not eat within 3 hours of laying down. Prop head of bed up on wood or bricks to create a 6 inch incline.  I believe that if you eat a higher fiber diet and avoid fatty/spicy/greasy foods that this will significantly improve your abdominal pain, diarrhea, and reflux.  If you are interested in possible hemorrhoid banding to assist with your constant irritation and itching not relieved by hemorrhoid cream then we can discuss that at your next appointment. For now continue hemorrhoid cream as needed. You could also try a barrier cream with zinc oxide to the external perineum near the anal canal to help relieve itching (do not use internally).   Given your eosinophilic esophagitis and your diarrhea it would be a good idea to eliminate lactose products as this is a typical culprit of both.   It was a pleasure to see you today. I want to create trusting relationships with patients. If you receive a survey regarding your visit,  I  greatly appreciate you taking time to fill this out on paper or through your MyChart. I value your feedback.  Venetia Night, MSN, FNP-BC, AGACNP-BC [facility]

## 2022-10-17 NOTE — Telephone Encounter (Signed)
Madeline Mccormick, you are scheduled for a virtual visit with your provider today.  Just as we do with appointments in the office, we must obtain your consent to participate.  Your consent will be active for this visit and any virtual visit you may have with one of our providers in the next 365 days.  If you have a MyChart account, I can also send a copy of this consent to you electronically.  All virtual visits are billed to your insurance company just like a traditional visit in the office.  As this is a virtual visit, video technology does not allow for your provider to perform a traditional examination.  This may limit your provider's ability to fully assess your condition.  If your provider identifies any concerns that need to be evaluated in person or the need to arrange testing such as labs, EKG, etc, we will make arrangements to do so.  Although advances in technology are sophisticated, we cannot ensure that it will always work on either your end or our end.  If the connection with a video visit is poor, we may have to switch to a telephone visit.  With either a video or telephone visit, we are not always able to ensure that we have a secure connection.   I need to obtain your verbal consent now.   Are you willing to proceed with your visit today? Yes.

## 2022-10-18 ENCOUNTER — Telehealth: Payer: Self-pay | Admitting: *Deleted

## 2022-10-18 ENCOUNTER — Other Ambulatory Visit: Payer: Self-pay | Admitting: Gastroenterology

## 2022-10-18 DIAGNOSIS — K580 Irritable bowel syndrome with diarrhea: Secondary | ICD-10-CM

## 2022-10-18 DIAGNOSIS — R197 Diarrhea, unspecified: Secondary | ICD-10-CM

## 2022-10-18 NOTE — Telephone Encounter (Signed)
Samples of Viberzi were left at front desk with lab requisitions.

## 2022-10-26 ENCOUNTER — Telehealth (HOSPITAL_COMMUNITY): Payer: Self-pay | Admitting: *Deleted

## 2022-10-26 DIAGNOSIS — F902 Attention-deficit hyperactivity disorder, combined type: Secondary | ICD-10-CM

## 2022-10-26 MED ORDER — LISDEXAMFETAMINE DIMESYLATE 30 MG PO CAPS: 30.0000 mg | ORAL_CAPSULE | Freq: Every day | ORAL | 0 refills | Status: DC

## 2022-10-26 NOTE — Telephone Encounter (Signed)
Pt called requesting refill of the Vyvanse 30 mg. Last fill 09/06/22. Pt has an appointment scheduled for 11/02/22.

## 2022-10-26 NOTE — Telephone Encounter (Signed)
Done

## 2022-10-27 ENCOUNTER — Other Ambulatory Visit (HOSPITAL_COMMUNITY)
Admission: RE | Admit: 2022-10-27 | Discharge: 2022-10-27 | Disposition: A | Discharge status: Home / Self Care | Payer: Medicaid Other | Source: Ambulatory Visit | Attending: Gastroenterology | Admitting: Gastroenterology

## 2022-10-27 DIAGNOSIS — K580 Irritable bowel syndrome with diarrhea: Secondary | ICD-10-CM | POA: Diagnosis present

## 2022-10-27 LAB — LIPASE, BLOOD: Lipase: 28 U/L (ref 11–51)

## 2022-10-27 LAB — TSH: TSH: 0.88 u[IU]/mL (ref 0.350–4.500)

## 2022-11-02 ENCOUNTER — Telehealth (HOSPITAL_BASED_OUTPATIENT_CLINIC_OR_DEPARTMENT_OTHER): Payer: Medicaid Other | Admitting: Psychiatry

## 2022-11-02 ENCOUNTER — Encounter (HOSPITAL_COMMUNITY): Payer: Self-pay | Admitting: Psychiatry

## 2022-11-02 DIAGNOSIS — F331 Major depressive disorder, recurrent, moderate: Secondary | ICD-10-CM

## 2022-11-02 DIAGNOSIS — F419 Anxiety disorder, unspecified: Secondary | ICD-10-CM | POA: Diagnosis not present

## 2022-11-02 DIAGNOSIS — F902 Attention-deficit hyperactivity disorder, combined type: Secondary | ICD-10-CM | POA: Diagnosis not present

## 2022-11-02 MED ORDER — ESCITALOPRAM OXALATE 20 MG PO TABS: 20.0000 mg | ORAL_TABLET | Freq: Every day | ORAL | 0 refills | Status: DC

## 2022-11-02 MED ORDER — ARIPIPRAZOLE 5 MG PO TABS: 5.0000 mg | ORAL_TABLET | Freq: Every day | ORAL | 0 refills | Status: DC

## 2022-11-02 NOTE — Progress Notes (Signed)
Virtual Visit via Telephone Note  I connected with Madeline Mccormick on 11/02/22 at 10:40 AM EST by telephone and verified that I am speaking with the correct person using two identifiers.  Location: Patient: In Car Provider: Home Office   I discussed the limitations, risks, security and privacy concerns of performing an evaluation and management service by telephone and the availability of in person appointments. I also discussed with the patient that there may be a patient responsible charge related to this service. The patient expressed understanding and agreed to proceed.   History of Present Illness: Patient is evaluated by phone session.  She is doing well on her current medication.  She reported family life is going well.  Recently they celebrated 28-year-old son's birthday.  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  She is hoping to graduate next year in fall.  She sleeps good.  She denies any panic attack, crying spells or any feeling of hopelessness or worthlessness.  She is able to do multitasking and she feels the current medicine doing the job.  Recently she has seen GI and diagnosed with IBS.  She is watching her diet closely and that is helping her controlling the symptoms.  She reported her energy is improved.  She denies any suicidal thoughts or any mania.  She denies drinking or using any illegal substances.  She is compliant with Abilify, Vyvanse and Lexapro.  She has no tremors, shakes or any EPS.   Past Psychiatric History: H/O ADHD diagnosed by PCP but no formal psychological testing was done. Took Vyvanse in 2014 until d/c in February 2020 after got pregnant.  Taking Lexapro since 2018. Tried BuSpar and Lamictal. Wellbutrin did not work. No h/o suicidal attempt, inpatient treatment, hallucination, OCD, PTSD or panic attacks.   Psychiatric Specialty Exam: Physical Exam  Review of Systems  Weight 148 lb (67.1 kg),  last menstrual period 10/14/2022.There is no height or weight on file to calculate BMI.  General Appearance: NA  Eye Contact:  NA  Speech:  Clear and Coherent and Normal Rate  Volume:  Normal  Mood:  Euthymic  Affect:  NA  Thought Process:  Goal Directed  Orientation:  Full (Time, Place, and Person)  Thought Content:  Logical  Suicidal Thoughts:  No  Homicidal Thoughts:  No  Memory:  Immediate;   Good Recent;   Good Remote;   Good  Judgement:  Good  Insight:  Present  Psychomotor Activity:  NA  Concentration:  Concentration: Good and Attention Span: Good  Recall:  Good  Fund of Knowledge:  Good  Language:  Good  Akathisia:  No  Handed:  Right  AIMS (if indicated):     Assets:  Communication Skills Desire for Improvement Housing Resilience Social Support Talents/Skills Transportation  ADL's:  Intact  Cognition:  WNL  Sleep:   ok      Assessment and Plan: Major depressive disorder, recurrent.  Anxiety.  ADHD, combined type.  Patient is stable on her current medication.  She has no tremor or shakes or any EPS.  Her plan is to celebrate the holidays with her family.  Continue Vyvanse 30 mg in the morning, Abilify 5 mg daily and Lexapro 20 mg daily.  Recommended to call us back if she has any question or any concern.  Follow-up in 3 months.  She recently picked up her Vyvanse and does not need the new prescription today but will call  us when she need in the future.  Follow Up Instructions:    I discussed the assessment and treatment plan with the patient. The patient was provided an opportunity to ask questions and all were answered. The patient agreed with the plan and demonstrated an understanding of the instructions.   The patient was advised to call back or seek an in-person evaluation if the symptoms worsen or if the condition fails to improve as anticipated.  Collaboration of Care: Other provider involved in patient's care AEB notes are available in the epic to  review.  Patient/Guardian was advised Release of Information must be obtained prior to any record release in order to collaborate their care with an outside provider. Patient/Guardian was advised if they have not already done so to contact the registration department to sign all necessary forms in order for [place] to release information regarding their care.   Consent: Patient/Guardian gives verbal consent for treatment and assignment of benefits for services provided during this visit. Patient/Guardian expressed understanding and agreed to proceed.    I provided 17 minutes of non-face-to-face time during this encounter.   Cleotis Nipper, MD

## 2022-12-04 NOTE — Progress Notes (Deleted)
GI Office Note    Referring Provider: [facility] Primary Care Physician:  [facility] Primary Gastroenterologist: Gerrit Friends.Rourk, MD  Date:  12/04/2022  ID:  Madeline Thain, DOB Sep 24, 1996, MRN [id number]   Chief Complaint   No chief complaint on file.   History of Present Illness  Madeline Mccormick is a 26 y.o. female with a history of ADHD, anxiety, depression, anemia, weight loss following with hematology, and preeclampsia in 2010*** presenting today for follow-up of abdominal pain, diarrhea, EoE.  Labs 07/26/2022: celiac serologies negative, CMP with normal renal and hepatic function.   Abdominal ultrasound 08/02/2022: Sludge in gallbladder without cholelithiasis or cholecystitis.  Noted to have 2-3 gallbladder polyps measuring 5 mm.  No follow-up imaging recommended.  Patient recommended to have upper endoscopy given her upper GI symptoms negative ultrasound.  Since she was going to be undergoing an upper endoscopy, colonoscopy was also ordered given her alternating bowel habits and her anemia.   EGD 08/26/2022: Abnormal distal esophagus with verrucous appearing mucosa without any ulceration or erosions.  Abnormal specks that were unable to be washed off.  Esophagus appeared patent.  Normal stomach and duodenum.  Concern for possible pill induced esophagitis.  Esophageal biopsies consistent with eosinophilic esophagitis with reactive changes in the mucosa.  No malignancy identified.  Patient advised on swallowing precautions.  Advised to start on pantoprazole 40 mg daily, 30 minutes prior to breakfast.   Colonoscopy 08/26/2022: Entire examined colon normal.  Nonbleeding internal hemorrhoids. Normal TI.  Recommending repeat colonoscopy at age 55 for screening purposes.   Recent follow-up with hematology/oncology 09/29/2022.  She reported that she only took her pantoprazole 40 mg once as she reports it made her nauseous.  She denied any more frequent bruising, petechiae,  shortness of breath, chest pain, palpitations, or change in bladder habits.  She reported her appetite comes and goes and she is doing her best to stay hydrated.  She stated her weight was slightly down to 142 pounds due to not eating much.  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  Given that she was not taking Protonix, she was started on omeprazole 20 mg daily and was given prescription for inhaled fluticasone.  Advised to follow-up in the clinic in 3 months.   Labs 09/29/2022: Iron 107, iron saturation high at 35%, ferritin within normal limits.  Hemoglobin 13.  Low B12 at 148.  Last visit performed virtually 10/17/2022.  Complain of ongoing issues with hemorrhoids Anusol cream, still having a lot of pruritus.  Denied burning or rectal pain.  Continue to have a fair amount of diarrhea usually postprandially.  Abdominal cramping prior to defecation and postprandially.  Denied incontinence.  Gallbladder in place.  Not taking Imodium or Pepto-Bismol.  Denied constipation, melena, BRBPR.  Still lack of appetite and nausea without vomiting.  Continue to eat fried foods.  Manage occasional heartburn but not with every meal.  Taking B12 and vitamin D supplementation.  Reinforced GERD diet.  Discussed EOE and importance of swallowed fluticasone not inhaled.  Advised to avoid lactose.  Follow with hematology for anemia and weight loss.  Noted to previously not tolerate pantoprazole due to nausea prior workup negative for celiac.  IBS suspected as cause for abdominal pain along with diarrhea however difficult to discern given esophagitis and her family history.  Considered CT versus HIDA versus GES if symptoms continue.  Advised to try Viberzi 75 mg daily (advised to come to the  office to pick up samples).  Plan check lipase and TSH.  Advised omeprazole 20 mg daily.  Diet discussed.  Advised to continue over-the-counter hemorrhoid cream, using based cream to  perineal area if any irritation due to frequent wiping.  Discussed hemorrhoid banding follow-up in 2 months.  TSH and lipase normal.  Today:  Diarrhea: Viberzi?  GERD, nausea, EoE: Dysphagia?  Hemorrhoids:    Wt Readings from Last 3 Encounters:  10/17/22 148 lb (67.1 kg)  09/29/22 147 lb 1.9 oz (66.7 kg)  08/26/22 150 lb (68 kg)  08/26/2022         150 lb 08/15/2022       146 lb    Current Outpatient Medications  Medication Sig Dispense Refill   ARIPiprazole (ABILIFY) 5 MG tablet Take 1 tablet (5 mg total) by mouth daily. 90 tablet 0   escitalopram (LEXAPRO) 20 MG tablet Take 1 tablet (20 mg total) by mouth daily. 90 tablet 0   fluticasone (FLOVENT HFA) 220 MCG/ACT inhaler Once every morning, Hold your breath. Dispense 2 puffs of the inhaler in your mouth and swallow. Follow with a glass of water. Repeat at night time 1 each 12   lisdexamfetamine (VYVANSE) 30 MG capsule Take 1 capsule (30 mg total) by mouth daily. 30 capsule 0   Melatonin 5 MG CHEW Chew by mouth at bedtime.     omeprazole (PRILOSEC) 20 MG capsule Take 20 mg by mouth daily.     No current facility-administered medications for this visit.   Facility-Administered Medications Ordered in Other Visits  Medication Dose Route Frequency Provider Last Rate Last Admin   sodium chloride flush (NS) 0.9 % injection 10 mL  10 mL Intracatheter Once PRN Erenest Blankarter, Sarah M, NP       sodium chloride flush (NS) 0.9 % injection 3 mL  3 mL Intracatheter Once PRN Erenest Blankarter, Sarah M, NP        Past Medical History:  Diagnosis Date   ADHD    previously took Vivant - stopped 01/2019   Allergy    Anxiety    Depression    History of pre-eclampsia 10/21/2019    Past Surgical History:  Procedure Laterality Date   BIOPSY  08/26/2022   Procedure: BIOPSY;  Surgeon: Corbin Adeourk, Robert M, MD;  [facility];  Service: Endoscopy;;   COLONOSCOPY WITH PROPOFOL N/A 08/26/2022   Procedure: COLONOSCOPY WITH PROPOFOL;  Surgeon: Corbin Adeourk, Robert M,  MD;  Location: [facility];  Service: Endoscopy;  Laterality: N/A;  11:15am   COLPOSCOPY  08/15/2022   DENTAL SURGERY     ESOPHAGOGASTRODUODENOSCOPY (EGD) WITH PROPOFOL N/A 08/26/2022   Procedure: ESOPHAGOGASTRODUODENOSCOPY (EGD) WITH PROPOFOL;  Surgeon: Corbin Adeourk, Robert M, MD;  Location: [facility];  Service: Endoscopy;  Laterality: N/A;   THERAPEUTIC ABORTION      Family History  Problem Relation Age of Onset   Depression Mother    Diabetes Mother    Thyroid disease Mother        radiation therapy   Other Mother        gastroparesis with stimulator   Arthritis Maternal Grandmother    Stroke Maternal Grandmother    Sudden death Maternal Grandmother    Colon cancer Neg Hx    Colon polyps Neg Hx    Celiac disease Neg Hx     Allergies as of 12/05/2022   (No Known Allergies)    Social History   Socioeconomic History   Marital status: Single    Spouse  name: Not on file   Number of children: Not on file   Years of education: Not on file   Highest education level: Not on file  Occupational History    Comment: unemployed  Tobacco Use   Smoking status: Every Day    Types: E-cigarettes   Smokeless tobacco: Never   Tobacco comments:    Vapes  Vaping Use   Vaping Use: Every day  Substance and Sexual Activity   Alcohol use: No    Alcohol/week: 0.0 standard drinks of alcohol   Drug use: No   Sexual activity: Yes    Birth control/protection: None  Other Topics Concern   Not on file  Social History Narrative   Not on file   Social Determinants of Health   Financial Resource Strain: Not on file  Food Insecurity: No Food Insecurity (08/15/2022)   Hunger Vital Sign    Worried About Running Out of Food in the Last Year: Never true    Ran Out of Food in the Last Year: Never true  Transportation Needs: No Transportation Needs (08/15/2022)   PRAPARE - Administrator, Civil Service (Medical): No    Lack of Transportation (Non-Medical): No  Physical Activity: Not  on file  Stress: Not on file  Social Connections: Not on file     Review of Systems   Gen: Denies fever, chills, anorexia. Denies fatigue, weakness, weight loss.  CV: Denies chest pain, palpitations, syncope, peripheral edema, and claudication. Resp: Denies dyspnea at rest, cough, wheezing, coughing up blood, and pleurisy. GI: See HPI Derm: Denies rash, itching, dry skin Psych: Denies depression, anxiety, memory loss, confusion. No homicidal or suicidal ideation.  Heme: Denies bruising, bleeding, and enlarged lymph nodes.   Physical Exam   There were no vitals taken for this visit.  General:   Alert and oriented. No distress noted. Pleasant and cooperative.  Head:  Normocephalic and atraumatic. Eyes:  Conjuctiva clear without scleral icterus. Mouth:  Oral mucosa pink and moist. Good dentition. No lesions. Lungs:  Clear to auscultation bilaterally. No wheezes, rales, or rhonchi. No distress.  Heart:  S1, S2 present without murmurs appreciated.  Abdomen:  +BS, soft, non-tender and non-distended. No rebound or guarding. No HSM or masses noted. Rectal: *** Msk:  Symmetrical without gross deformities. Normal posture. Extremities:  Without edema. Neurologic:  Alert and  oriented x4 Psych:  Alert and cooperative. Normal mood and affect.   Assessment  Madeline Pott is a 26 y.o. female with a history of ADHD, anxiety, depression, anemia, weight loss following with hematology, and preeclampsia in 2010*** presenting today for follow-up of abdominal pain, diarrhea, EoE.  GERD, eosinophilic esophagitis: Previously failed pantoprazole 40 mg daily due to nausea.  EGD in September with abnormal distal esophagus, concern for pill induced esophagitis, biopsies consistent with eosinophilic esophagitis and reactive changes.  Maintained on omeprazole 20 mg daily. ***Currently using swallowed fluticasone 2 sprays twice daily.***Dysphagia?*** Repeat EGD recommended to assess for endoscopic and  histologic improvement given she has been on treatment for at least 8 weeks with PPI daily and swallowed fluticasone***??  Abdominal pain, nausea: Prior abdominal ultrasound presence of gallbladder sludge and polyps without cholecystitis.  Pain primarily located in the lower abdomen and postprandial in nature usually associated with diarrhea.  Previously also with some epigastric pain, could be secondary to eosinophilic esophagitis and GERD.  Anemia: Following with hematology.  May be anemic since her pregnancy in 2020.  Most recent hemoglobin 13 in October.  Labs  previously noted for low B12 and folate.  Anemia possibly secondary to heavy menstrual cycles.  Recent EGD and colonoscopy with EOE and internal hemorrhoids.  IBS, diarrhea predominant: Primarily has a bowel movement after each meal with associated abdominal pain.  Previously with a history of intermittent constipation.  Continues to have occasional nausea as stated above.  Workup thus far with normal lipase, TSH, negative celiac panel.  Prior imaging and labs with no evidence of hepatobiliary etiology.  Biliary dyskinesia remains in differential.  Viberzi??***  Hemorrhoids:   PLAN   *** Continue omeprazole 20 mg daily*** Continue swallowed fluticasone 2 sprays twice daily Avoid fatty/greasy/spicy foods Avoid lactose Hemorrhoid banding??*** Viberzi??*** Proceed with upper endoscopy*** with propofol by Dr. Jena Gauss in near future: the risks, benefits, and alternatives have been discussed with the patient in detail. The patient states understanding and desires to proceed. ASA 2 ???***     Brooke Bonito, MSN, FNP-BC, AGACNP-BC [facility]

## 2022-12-05 ENCOUNTER — Ambulatory Visit: Payer: Medicaid Other | Admitting: Gastroenterology

## 2022-12-29 ENCOUNTER — Inpatient Hospital Stay: Payer: Medicaid Other | Attending: Hematology & Oncology

## 2022-12-29 ENCOUNTER — Encounter: Payer: Self-pay | Admitting: Medical Oncology

## 2022-12-29 ENCOUNTER — Other Ambulatory Visit: Payer: Self-pay

## 2022-12-29 ENCOUNTER — Inpatient Hospital Stay (HOSPITAL_BASED_OUTPATIENT_CLINIC_OR_DEPARTMENT_OTHER): Payer: Medicaid Other | Admitting: Medical Oncology

## 2022-12-29 VITALS — BP 99/66 | HR 74 | Temp 97.7°F | Resp 18 | Ht 64.0 in | Wt 150.1 lb

## 2022-12-29 DIAGNOSIS — K200 Eosinophilic esophagitis: Secondary | ICD-10-CM

## 2022-12-29 DIAGNOSIS — Z79899 Other long term (current) drug therapy: Secondary | ICD-10-CM | POA: Diagnosis not present

## 2022-12-29 DIAGNOSIS — D509 Iron deficiency anemia, unspecified: Secondary | ICD-10-CM | POA: Diagnosis present

## 2022-12-29 DIAGNOSIS — R233 Spontaneous ecchymoses: Secondary | ICD-10-CM | POA: Insufficient documentation

## 2022-12-29 DIAGNOSIS — E538 Deficiency of other specified B group vitamins: Secondary | ICD-10-CM | POA: Diagnosis not present

## 2022-12-29 LAB — CBC WITH DIFFERENTIAL/PLATELET
Abs Immature Granulocytes: 0.01 10*3/uL (ref 0.00–0.07)
Basophils Absolute: 0 10*3/uL (ref 0.0–0.1)
Basophils Relative: 0 %
Eosinophils Absolute: 0.1 10*3/uL (ref 0.0–0.5)
Eosinophils Relative: 3 %
HCT: 38.1 % (ref 36.0–46.0)
Hemoglobin: 12.4 g/dL (ref 12.0–15.0)
Immature Granulocytes: 0 %
Lymphocytes Relative: 37 %
Lymphs Abs: 2.1 10*3/uL (ref 0.7–4.0)
MCH: 30.5 pg (ref 26.0–34.0)
MCHC: 32.5 g/dL (ref 30.0–36.0)
MCV: 93.8 fL (ref 80.0–100.0)
Monocytes Absolute: 0.5 10*3/uL (ref 0.1–1.0)
Monocytes Relative: 9 %
Neutro Abs: 2.9 10*3/uL (ref 1.7–7.7)
Neutrophils Relative %: 51 %
Platelets: 345 10*3/uL (ref 150–400)
RBC: 4.06 MIL/uL (ref 3.87–5.11)
RDW: 12.6 % (ref 11.5–15.5)
WBC: 5.6 10*3/uL (ref 4.0–10.5)
nRBC: 0 % (ref 0.0–0.2)

## 2022-12-29 LAB — IRON AND IRON BINDING CAPACITY (CC-WL,HP ONLY)
Iron: 157 ug/dL (ref 28–170)
Saturation Ratios: 52 % — ABNORMAL HIGH (ref 10.4–31.8)
TIBC: 302 ug/dL (ref 250–450)
UIBC: 145 ug/dL — ABNORMAL LOW (ref 148–442)

## 2022-12-29 LAB — FERRITIN: Ferritin: 81 ng/mL (ref 11–307)

## 2022-12-29 NOTE — Progress Notes (Signed)
Hematology and Oncology Follow Up Visit  Madeline Mccormick [id number] 1996-05-24 27 y.o. 12/29/2022   Principle Diagnosis:  Iron deficiency Nutritional Deficiency Eosinophilic Esophagitis  Bruising    Current Therapy:        IV iron as indicated    Interim History:  Madeline Mccormick is here today for follow-up.   At her last visit we treated her eosinophilic esophagitis with PPI/antifungal and asked that she follow up with GI. She has been trying to follow an IBS friendly diet which she is somewhat successful at. She has had some reduction in her stomach pains. Still having fatigue but thinks that her B12 is low as she is having a difficult time remembering to take this oral supplement. Bruising is a bit better- worse around her menstrual cycles but does not involve the trunk. No other blood loss.   Wt Readings from Last 3 Encounters:  12/29/22 150 lb 1.3 oz (68.1 kg)  10/17/22 148 lb (67.1 kg)  09/29/22 147 lb 1.9 oz (66.7 kg)    ECOG Performance Status: 1 - Symptomatic but completely ambulatory  Medications:  Allergies as of 12/29/2022   No Known Allergies      Medication List        Accurate as of December 29, 2022  9:36 AM. If you have any questions, ask your nurse or doctor.          ARIPiprazole 5 MG tablet Commonly known as: Abilify Take 1 tablet (5 mg total) by mouth daily.   escitalopram 20 MG tablet Commonly known as: LEXAPRO Take 1 tablet (20 mg total) by mouth daily.   fluticasone 220 MCG/ACT inhaler Commonly known as: FLOVENT HFA Once every morning, Hold your breath. Dispense 2 puffs of the inhaler in your mouth and swallow. Follow with a glass of water. Repeat at night time   lisdexamfetamine 30 MG capsule Commonly known as: Vyvanse Take 1 capsule (30 mg total) by mouth daily.   Melatonin 5 MG Chew Chew by mouth at bedtime.   omeprazole 20 MG capsule Commonly known as: PRILOSEC Take 20 mg by mouth daily.        Allergies: No Known  Allergies  Past Medical History, Surgical history, Social history, and Family History were reviewed and updated.  Review of Systems: All other 10 point review of systems is negative.   Physical Exam:  height is 5\' 4"  (1.626 m) and weight is 150 lb 1.3 oz (68.1 kg). Her oral temperature is 97.7 F (36.5 C). Her blood pressure is 99/66 and her pulse is 74. Her respiration is 18 and oxygen saturation is 100%.   Wt Readings from Last 3 Encounters:  12/29/22 150 lb 1.3 oz (68.1 kg)  10/17/22 148 lb (67.1 kg)  09/29/22 147 lb 1.9 oz (66.7 kg)    Ocular: Sclerae unicteric, pupils equal, round and reactive to light Ear-nose-throat: Oropharynx clear, dentition fair Lymphatic: No cervical or supraclavicular adenopathy Lungs no rales or rhonchi, good excursion bilaterally Heart regular rate and rhythm, no murmur appreciated Abd soft, nontender, positive bowel sounds MSK no focal spinal tenderness, no joint edema Neuro: non-focal, well-oriented, appropriate affect  Lab Results  Component Value Date   WBC 5.6 12/29/2022   HGB 12.4 12/29/2022   HCT 38.1 12/29/2022   MCV 93.8 12/29/2022   PLT 345 12/29/2022   Lab Results  Component Value Date   FERRITIN 66 09/29/2022   IRON 107 09/29/2022   TIBC 308 09/29/2022   UIBC 201 09/29/2022   IRONPCTSAT  35 (H) 09/29/2022   Lab Results  Component Value Date   RETICCTPCT 1.0 09/29/2022   RBC 4.06 12/29/2022   No results found for: "KPAFRELGTCHN", "LAMBDASER", "KAPLAMBRATIO" No results found for: "IGGSERUM", "IGA", "IGMSERUM" No results found for: "TOTALPROTELP", "ALBUMINELP", "A1GS", "A2GS", "BETS", "BETA2SER", "GAMS", "MSPIKE", "SPEI"   Chemistry      Component Value Date/Time   NA 136 08/05/2022 1440   K 3.6 08/05/2022 1440   CL 107 08/05/2022 1440   CO2 22 08/05/2022 1440   BUN 11 08/05/2022 1440   CREATININE 0.63 08/05/2022 1440   CREATININE 0.73 02/15/2022 1049      Component Value Date/Time   CALCIUM 8.7 (L) 08/05/2022 1440    ALKPHOS 47 08/05/2022 1440   AST 12 (L) 08/05/2022 1440   AST 11 (L) 02/15/2022 1049   ALT 7 08/05/2022 1440   ALT 6 02/15/2022 1049   BILITOT 0.4 08/05/2022 1440   BILITOT 0.4 02/15/2022 1049       Impression and Plan: Madeline Mccormick is a very pleasant 27 yo caucasian female with intermittent iron deficiency anemia.   She is doing well all things considered. CBC stable. B12 pending. If lower than target I would suggest she restart her oral supplement daily and have [place] recheck labs in 2 months. If still low she would benefit from B12 injections which we discussed today.  Continue follow up with GI.  RTC 6 months APP, labs.   Hughie Closs, PA-C 1/11/20249:36 AM

## 2022-12-30 ENCOUNTER — Encounter: Payer: Self-pay | Admitting: Family

## 2023-02-02 ENCOUNTER — Telehealth (HOSPITAL_BASED_OUTPATIENT_CLINIC_OR_DEPARTMENT_OTHER): Payer: Medicaid Other | Admitting: Psychiatry

## 2023-02-02 ENCOUNTER — Encounter (HOSPITAL_COMMUNITY): Payer: Self-pay | Admitting: Psychiatry

## 2023-02-02 DIAGNOSIS — F902 Attention-deficit hyperactivity disorder, combined type: Secondary | ICD-10-CM | POA: Diagnosis not present

## 2023-02-02 DIAGNOSIS — F331 Major depressive disorder, recurrent, moderate: Secondary | ICD-10-CM | POA: Diagnosis not present

## 2023-02-02 DIAGNOSIS — F419 Anxiety disorder, unspecified: Secondary | ICD-10-CM

## 2023-02-02 MED ORDER — ARIPIPRAZOLE 5 MG PO TABS: 5.0000 mg | ORAL_TABLET | Freq: Every day | ORAL | 0 refills | Status: DC

## 2023-02-02 MED ORDER — LISDEXAMFETAMINE DIMESYLATE 30 MG PO CAPS: 30.0000 mg | ORAL_CAPSULE | Freq: Every day | ORAL | 0 refills | Status: DC

## 2023-02-02 MED ORDER — ESCITALOPRAM OXALATE 20 MG PO TABS: 20.0000 mg | ORAL_TABLET | Freq: Every day | ORAL | 0 refills | Status: DC

## 2023-02-02 NOTE — Progress Notes (Signed)
[facility] MD Virtual Progress Note   Patient Location: Home Provider Location: Home office  I connect with patient by telephone and verified that I am speaking with correct person by using two identifiers. I discussed the limitations of evaluation and management by telemedicine and the availability of in person appointments. I also discussed with the patient that there may be a patient responsible charge related to this service. The patient expressed understanding and agreed to proceed.  Martinique Locey [id number] 27 y.o.  02/02/2023 10:26 AM    History of Present Illness:  Patient is evaluated by phone session.  She is sad and dysphoric because her grandmother died last months and 3 weeks ago her grandfather died.  She has a hard time dealing the loss but tried to keep herself busy.  She was out of the Vyvanse and admitted struggle with attention, focus and multitasking.  She sleeps okay.  She admitted tired and not attentive and like to go back on Vyvanse.  Her job is going okay.  She tried to keep herself busy at work and at home.  She does not feel she need any grief counseling.  She is studying [employer] with major in Arboriculturist.  She also working part-time as a Educational psychologist.  She denies any major panic attack, feeling of hopelessness or worthlessness.  Her appetite is okay.  She is slightly gained weight but hoping to go back on Vyvanse to make it active and motivated to do things.  She do not recall any tremors or shakes or any EPS.   Past Psychiatric History: H/O ADHD diagnosed by PCP but no formal psychological testing was done. Took Vyvanse in 2014 until d/c in February 2020 after got pregnant.  Taking Lexapro since 2018. Tried BuSpar and Lamictal. Wellbutrin did not work. No h/o suicidal attempt, inpatient treatment, hallucination, OCD, PTSD or panic attacks.    Outpatient Encounter Medications as of 02/02/2023  Medication Sig   ARIPiprazole (ABILIFY) 5 MG  tablet Take 1 tablet (5 mg total) by mouth daily.   escitalopram (LEXAPRO) 20 MG tablet Take 1 tablet (20 mg total) by mouth daily.   fluticasone (FLOVENT HFA) 220 MCG/ACT inhaler Once every morning, Hold your breath. Dispense 2 puffs of the inhaler in your mouth and swallow. Follow with a glass of water. Repeat at night time   lisdexamfetamine (VYVANSE) 30 MG capsule Take 1 capsule (30 mg total) by mouth daily.   Melatonin 5 MG CHEW Chew by mouth at bedtime.   omeprazole (PRILOSEC) 20 MG capsule Take 20 mg by mouth daily.   Facility-Administered Encounter Medications as of 02/02/2023  Medication   sodium chloride flush (NS) 0.9 % injection 10 mL   sodium chloride flush (NS) 0.9 % injection 3 mL    Recent Results (from the past 2160 hour(s))  Ferritin     Status: None   Collection Time: 12/29/22  8:50 AM  Result Value Ref Range   Ferritin 81 11 - 307 ng/mL    Comment: Performed at [facility], [place], [place], [place] [address]  CBC with Differential     Status: None   Collection Time: 12/29/22  8:50 AM  Result Value Ref Range   WBC 5.6 4.0 - 10.5 K/uL   RBC 4.06 3.87 - 5.11 MIL/uL   Hemoglobin 12.4 12.0 - 15.0 g/dL   HCT 38.1 36.0 - 46.0 %   MCV 93.8 80.0 - 100.0 fL   MCH 30.5 26.0 - 34.0 pg  MCHC 32.5 30.0 - 36.0 g/dL   RDW 12.6 11.5 - 15.5 %   Platelets 345 150 - 400 K/uL   nRBC 0.0 0.0 - 0.2 %   Neutrophils Relative % 51 %   Neutro Abs 2.9 1.7 - 7.7 K/uL   Lymphocytes Relative 37 %   Lymphs Abs 2.1 0.7 - 4.0 K/uL   Monocytes Relative 9 %   Monocytes Absolute 0.5 0.1 - 1.0 K/uL   Eosinophils Relative 3 %   Eosinophils Absolute 0.1 0.0 - 0.5 K/uL   Basophils Relative 0 %   Basophils Absolute 0.0 0.0 - 0.1 K/uL   Immature Granulocytes 0 %   Abs Immature Granulocytes 0.01 0.00 - 0.07 K/uL    Comment: Performed at [facility], [address], [place], [place] [address]  Iron and Iron Binding Capacity  ([facility] only)     Status: Abnormal   Collection Time: 12/29/22  8:50 AM  Result Value Ref Range   Iron 157 28 - 170 ug/dL   TIBC 302 250 - 450 ug/dL   Saturation Ratios 52 (H) 10.4 - 31.8 %   UIBC 145 (L) 148 - 442 ug/dL    Comment: Performed at [facility], [place] [address], [place], [place] [address]     Psychiatric Specialty Exam: Physical Exam  Review of Systems  Weight 150 lb (68 kg).There is no height or weight on file to calculate BMI.  General Appearance: NA  Eye Contact:  NA  Speech:  Slow  Volume:  Decreased  Mood:  Dysphoric  Affect:  NA  Thought Process:  Goal Directed  Orientation:  Full (Time, Place, and Person)  Thought Content:  Logical  Suicidal Thoughts:  No  Homicidal Thoughts:  No  Memory:  Immediate;   Good Recent;   Good Remote;   Good  Judgement:  Intact  Insight:  Present  Psychomotor Activity:  NA  Concentration:  Concentration: Fair and Attention Span: Fair  Recall:  Good  Fund of Knowledge:  Good  Language:  Good  Akathisia:  No  Handed:  Right  AIMS (if indicated):     Assets:  Communication Skills Desire for Improvement Housing Talents/Skills Transportation  ADL's:  Intact  Cognition:  WNL  Sleep: Okay     Assessment/Plan: Attention deficit hyperactivity disorder (ADHD), combined type - Plan: lisdexamfetamine (VYVANSE) 30 MG capsule  Anxiety - Plan: ARIPiprazole (ABILIFY) 5 MG tablet, escitalopram (LEXAPRO) 20 MG tablet  MDD (major depressive disorder), recurrent episode, moderate (HCC) - Plan: ARIPiprazole (ABILIFY) 5 MG tablet, escitalopram (LEXAPRO) 20 MG tablet   Discussed recent lost of grandparents.  Offered grief counseling but patient feel symptoms are manageable.  She like to go back on Vyvanse as she struggle with attention and focus and multitasking.  Continue Lexapro 20 mg daily and Abilify 5 mg daily.  Resume Vyvanse 30 mg in the morning.  Recommended to call us back if she has any question or  any concern.  Follow-up in 3 months.   Follow Up Instructions:     I discussed the assessment and treatment plan with the patient. The patient was provided an opportunity to ask questions and all were answered. The patient agreed with the plan and demonstrated an understanding of the instructions.   The patient was advised to call back or seek an in-person evaluation if the symptoms worsen or if the condition fails to improve as anticipated.    Collaboration  of Care: Other provider involved in patient's care AEB notes are available in epic to review.  Patient/Guardian was advised Release of Information must be obtained prior to any record release in order to collaborate their care with an outside provider. Patient/Guardian was advised if they have not already done so to contact the registration department to sign all necessary forms in order for [place] to release information regarding their care.   Consent: Patient/Guardian gives verbal consent for treatment and assignment of benefits for services provided during this visit. Patient/Guardian expressed understanding and agreed to proceed.     I provided 21 minutes of non face to face time during this encounter.  Kathlee Nations, MD 02/02/2023

## 2023-04-24 ENCOUNTER — Ambulatory Visit (INDEPENDENT_AMBULATORY_CARE_PROVIDER_SITE_OTHER): Payer: Medicaid Other | Admitting: *Deleted

## 2023-04-24 DIAGNOSIS — Z3201 Encounter for pregnancy test, result positive: Secondary | ICD-10-CM

## 2023-04-24 DIAGNOSIS — Z3200 Encounter for pregnancy test, result unknown: Secondary | ICD-10-CM

## 2023-04-24 DIAGNOSIS — Z3687 Encounter for antenatal screening for uncertain dates: Secondary | ICD-10-CM

## 2023-04-24 LAB — POCT PREGNANCY, URINE: Preg Test, Ur: POSITIVE — AB

## 2023-04-24 NOTE — Progress Notes (Signed)
Patient was assessed and managed by nursing staff during this encounter. I have reviewed the chart and agree with the documentation and plan. I have also made any necessary editorial changes.  Warden Fillers, MD 04/24/2023 5:25 PM

## 2023-04-24 NOTE — Progress Notes (Signed)
Patient dropped off urine for a pregnancy test. UPT positive. I called her and informed her of positive result. She reports sure LMP of 03/16/23 but states it was not a normal period. She states it was lighter and only lasted 3 days while her normal periods last longer and are heavier. I informed her we advise dating [place] to verify EDD in these situations. She agreed to first available dating [place] appointment 05/09/23.  I reviewed her meds and she states she is not taking any medications except PNV currently - she stopped all meds except PNV about a month ago. I advised once we determine her EDD she should start prenatal care. She voices understanding. Nancy Fetter

## 2023-05-04 ENCOUNTER — Telehealth (HOSPITAL_BASED_OUTPATIENT_CLINIC_OR_DEPARTMENT_OTHER): Payer: Medicaid Other | Admitting: Psychiatry

## 2023-05-04 ENCOUNTER — Encounter (HOSPITAL_COMMUNITY): Payer: Self-pay | Admitting: Psychiatry

## 2023-05-04 VITALS — Wt 150.0 lb

## 2023-05-04 DIAGNOSIS — F419 Anxiety disorder, unspecified: Secondary | ICD-10-CM | POA: Diagnosis not present

## 2023-05-04 DIAGNOSIS — F902 Attention-deficit hyperactivity disorder, combined type: Secondary | ICD-10-CM

## 2023-05-04 DIAGNOSIS — F331 Major depressive disorder, recurrent, moderate: Secondary | ICD-10-CM

## 2023-05-04 NOTE — Progress Notes (Signed)
[facility] MD Virtual Progress Note   Patient Location: Home Provider Location: Home Office  I connect with patient by telephone and verified that I am speaking with correct person by using two identifiers. I discussed the limitations of evaluation and management by telemedicine and the availability of in person appointments. I also discussed with the patient that there may be a patient responsible charge related to this service. The patient expressed understanding and agreed to proceed.  Madeline Mccormick [id number] 27 y.o.  05/04/2023 9:14 AM  History of Present Illness:  Patient evaluated by phone session.  She is very happy as a week ago she had a pregnancy test which was positive.  Patient stopped taking all her psychotropic medication.  She is not sure about the due date because it is very soon. Next week she had appointment at [facility] and she will assigned a provider on her appointment.  She had a good support from her boyfriend, her mother and grandmother.  She is sleeping good.  She is any irritability, anxiety, mood swings.  She reported her school is going well and she is making good grades.  She is hoping to finish school end of this December.  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  She reported her attention and focus is somewhat distracted but manageable.  Her appetite is okay and there has no significant weight change.  So far no major concern or issues.  She denies drinking or using any illegal substances.  She does feel sometimes tired but her sleep is okay.  Past Psychiatric History: H/O ADHD diagnosed by PCP but no formal psychological testing. H/O anxiety and depression. Good response with Vyvanse, Lexapro and Abilify. Meds d/c due to pregnancy. Tried BuSpar and Lamictal. Wellbutrin did not work. No h/o suicidal attempt, inpatient treatment, hallucination, OCD, PTSD or panic attacks.    Outpatient Encounter Medications as of 05/04/2023  Medication Sig    ARIPiprazole (ABILIFY) 5 MG tablet Take 1 tablet (5 mg total) by mouth daily. (Patient not taking: Reported on 04/24/2023)   escitalopram (LEXAPRO) 20 MG tablet Take 1 tablet (20 mg total) by mouth daily. (Patient not taking: Reported on 04/24/2023)   fluticasone (FLOVENT HFA) 220 MCG/ACT inhaler Once every morning, Hold your breath. Dispense 2 puffs of the inhaler in your mouth and swallow. Follow with a glass of water. Repeat at night time (Patient not taking: Reported on 04/24/2023)   lisdexamfetamine (VYVANSE) 30 MG capsule Take 1 capsule (30 mg total) by mouth daily. (Patient not taking: Reported on 04/24/2023)   Melatonin 5 MG CHEW Chew by mouth at bedtime. (Patient not taking: Reported on 04/24/2023)   omeprazole (PRILOSEC) 20 MG capsule Take 20 mg by mouth daily. (Patient not taking: Reported on 04/24/2023)   Facility-Administered Encounter Medications as of 05/04/2023  Medication   sodium chloride flush (NS) 0.9 % injection 10 mL   sodium chloride flush (NS) 0.9 % injection 3 mL    Recent Results (from the past 2160 hour(s))  Pregnancy, urine POC     Status: Abnormal   Collection Time: 04/24/23  1:55 PM  Result Value Ref Range   Preg Test, Ur POSITIVE (A) NEGATIVE    Comment:        THE SENSITIVITY OF THIS METHODOLOGY IS >24 mIU/mL      Psychiatric Specialty Exam: Physical Exam  Review of Systems  Constitutional:        Patient reported pregnancy     Weight 150 lb (  68 kg), last menstrual period 10/14/2022.There is no height or weight on file to calculate BMI.  General Appearance: NA  Eye Contact:  NA  Speech:  Clear and Coherent and Normal Rate  Volume:  Normal  Mood:   happy  Affect:  Congruent  Thought Process:  Goal Directed  Orientation:  Full (Time, Place, and Person)  Thought Content:  Logical  Suicidal Thoughts:  No  Homicidal Thoughts:  No  Memory:  Immediate;   Good Recent;   Good Remote;   Good  Judgement:  Good  Insight:  Present  Psychomotor Activity:   Normal  Concentration:  Concentration: Good and Attention Span: Good  Recall:  Good  Fund of Knowledge:  Good  Language:  Good  Akathisia:  No  Handed:  Right  AIMS (if indicated):     Assets:  Communication Skills Desire for Improvement Housing Resilience Social Support Talents/Skills Transportation  ADL's:  Intact  Cognition:  WNL  Sleep:  ok     Assessment/Plan: MDD (major depressive disorder), recurrent episode, moderate (HCC)  Attention deficit hyperactivity disorder (ADHD), combined type  Anxiety  I reviewed work results.  Patient is pregnant as recently find out 1 week ago.  She stopped taking all psychotropic medication.  She had a prior pregnancy without medication which she handled very well without medicine.  She had a good support from her boyfriend and family members.  Encouraged to keep appointment with her provider at [facility].  She like to go back on medication when pregnancy ended.  I have discussed with the patient if she feels her symptoms are coming back and she should call us immediately.  I also offered therapy but at this time patient does not feel she needed therapy.  Resources provided in case she needed.  We will follow-up in 2 months but patient understand that she can call us back sooner if needed.  No medication given on this encounter.   Follow Up Instructions:     I discussed the assessment and treatment plan with the patient. The patient was provided an opportunity to ask questions and all were answered. The patient agreed with the plan and demonstrated an understanding of the instructions.   The patient was advised to call back or seek an in-person evaluation if the symptoms worsen or if the condition fails to improve as anticipated.    Collaboration of Care: Other provider involved in patient's care AEB notes are available in epic to review.  Patient/Guardian was advised Release of Information must be obtained prior to any record release  in order to collaborate their care with an outside provider. Patient/Guardian was advised if they have not already done so to contact the registration department to sign all necessary forms in order for [place] to release information regarding their care.   Consent: Patient/Guardian gives verbal consent for treatment and assignment of benefits for services provided during this visit. Patient/Guardian expressed understanding and agreed to proceed.     I provided 29 minutes of non face to face time during this encounter.  Note: This document was prepared by [employer] voice dictation technology and any errors that results from this process are unintentional.    Cleotis Nipper, MD 05/04/2023

## 2023-05-09 ENCOUNTER — Ambulatory Visit (INDEPENDENT_AMBULATORY_CARE_PROVIDER_SITE_OTHER): Payer: Medicaid Other

## 2023-05-09 DIAGNOSIS — Z3A01 Less than 8 weeks gestation of pregnancy: Secondary | ICD-10-CM

## 2023-05-09 DIAGNOSIS — Z3687 Encounter for antenatal screening for uncertain dates: Secondary | ICD-10-CM | POA: Diagnosis not present

## 2023-06-06 ENCOUNTER — Telehealth (INDEPENDENT_AMBULATORY_CARE_PROVIDER_SITE_OTHER): Payer: Medicaid Other

## 2023-06-06 DIAGNOSIS — Z3480 Encounter for supervision of other normal pregnancy, unspecified trimester: Secondary | ICD-10-CM

## 2023-06-06 NOTE — Progress Notes (Signed)
New OB Intake  I connected with [place] Diprima  on 06/06/23 at 10:15 AM EDT by MyChart Video Visit and verified that I am speaking with the correct person using two identifiers. Nurse is located at [facility] and pt is located at home.  I discussed the limitations, risks, security and privacy concerns of performing an evaluation and management service by telephone and the availability of in person appointments. I also discussed with the patient that there may be a patient responsible charge related to this service. The patient expressed understanding and agreed to proceed.  I explained I am completing New OB Intake today. We discussed EDD of Not found.. Pt is [id number]. I reviewed her allergies, medications and Medical/Surgical/OB history.    Patient Active Problem List   Diagnosis Date Noted   Supervision of other normal pregnancy, antepartum 06/06/2023   LGSIL on Pap smear of cervix 06/23/2022   Iron deficiency anemia 02/18/2022   Essential hypertension 12/17/2019   Prediabetes 11/18/2014   Depression, controlled 05/04/2014   Trochanteric bursitis of right hip 04/30/2014   Situational anxiety 10/08/2013    Concerns addressed today  Delivery Plans Plans to deliver at [facility]. Discussed the nature of our practice with multiple providers including residents and students. Due to the size of the practice, the delivering provider may not be the same as those providing prenatal care.   Patient is not interested in water birth. Offered upcoming OB visit with CNM to discuss further.  MyChart/Babyscripts MyChart access verified. I explained pt will have some visits in office and some virtually. Babyscripts instructions given and order placed. Patient verifies receipt of registration text/e-mail. Account successfully created and app downloaded.  Blood Pressure Cuff/Weight Scale Pt has her own BP Cuff,  Explained after first prenatal appt pt will check weekly and document in Babyscripts. Patient does  have weight scale.  Anatomy US Explained first scheduled [place] will be around 19 weeks. Anatomy US scheduled for 08/08/23 at 0930a.  Is patient a CenteringPregnancy candidate?  Ask at [facility]      Is patient a Mom+Baby Combined Care candidate?  Not a candidate   If accepted, confirm patient does not intend to move from the area for at least 12 months, then notify Mom+Baby staff  Interested in [place]? If yes, send referral and doula dot phrase.    First visit review I reviewed new OB appt with patient. Explained pt will be seen by Lavonda Jumbo, DO at first visit. Discussed Avelina Laine genetic screening with patient.  Panorama and Horizon.. Routine prenatal labs needed at [facility] OB visit.   Last Pap Diagnosis  Date Value Ref Range Status  06/16/2022 - Low grade squamous intraepithelial lesion (LSIL) (A)  Final    Henrietta Dine, CMA 06/06/2023  10:36 AM

## 2023-06-13 ENCOUNTER — Other Ambulatory Visit: Payer: Self-pay

## 2023-06-13 ENCOUNTER — Ambulatory Visit (INDEPENDENT_AMBULATORY_CARE_PROVIDER_SITE_OTHER): Payer: Medicaid Other | Admitting: Family Medicine

## 2023-06-13 ENCOUNTER — Other Ambulatory Visit (HOSPITAL_COMMUNITY)
Admission: RE | Admit: 2023-06-13 | Discharge: 2023-06-13 | Disposition: A | Discharge status: Home / Self Care | Payer: Medicaid Other | Source: Ambulatory Visit | Attending: Family Medicine | Admitting: Family Medicine

## 2023-06-13 ENCOUNTER — Encounter: Payer: Self-pay | Admitting: Family Medicine

## 2023-06-13 VITALS — BP 102/58 | HR 67 | Wt 160.0 lb

## 2023-06-13 DIAGNOSIS — R7303 Prediabetes: Secondary | ICD-10-CM | POA: Diagnosis not present

## 2023-06-13 DIAGNOSIS — Z818 Family history of other mental and behavioral disorders: Secondary | ICD-10-CM

## 2023-06-13 DIAGNOSIS — O09291 Supervision of pregnancy with other poor reproductive or obstetric history, first trimester: Secondary | ICD-10-CM

## 2023-06-13 DIAGNOSIS — Z3480 Encounter for supervision of other normal pregnancy, unspecified trimester: Secondary | ICD-10-CM

## 2023-06-13 DIAGNOSIS — Z3A11 11 weeks gestation of pregnancy: Secondary | ICD-10-CM

## 2023-06-13 DIAGNOSIS — F32A Depression, unspecified: Secondary | ICD-10-CM

## 2023-06-13 DIAGNOSIS — Z3490 Encounter for supervision of normal pregnancy, unspecified, unspecified trimester: Secondary | ICD-10-CM | POA: Diagnosis present

## 2023-06-13 DIAGNOSIS — D508 Other iron deficiency anemias: Secondary | ICD-10-CM

## 2023-06-13 DIAGNOSIS — O09299 Supervision of pregnancy with other poor reproductive or obstetric history, unspecified trimester: Secondary | ICD-10-CM

## 2023-06-13 DIAGNOSIS — R87612 Low grade squamous intraepithelial lesion on cytologic smear of cervix (LGSIL): Secondary | ICD-10-CM

## 2023-06-13 NOTE — Progress Notes (Unsigned)
History:   Madeline Mccormick is a 27 y.o. G4P1021 at [redacted]w[redacted]d by {Ob dating:14516} being seen today for her first obstetrical visit.  Her obstetrical history is significant for {ob risk factors:10154}. Patient {does/does not:19097} intend to breast feed. Pregnancy history fully reviewed.  Patient reports {sx:14538}.      HISTORY: OB History  Gravida Para Term Preterm AB Living  4 1 1  0 2 1  SAB IAB Ectopic Multiple Live Births  1 1 0 0 1    # Outcome Date GA Lbr Len/2nd Weight Sex Delivery Anes PTL Lv  4 Current           3 Term 10/16/19 [redacted]w[redacted]d 25:31 / 01:36 6 lb 14.2 oz (3.125 kg) M Vag-Spont EPI  LIV     Name: Redwine,BOY Madeline     Apgar1: 6  Apgar5: 7  2 SAB 2019          1 IAB 2012            Last pap smear was done *** and was {Normal/Abnormal Appearance:21344::"normal"}  Past Medical History:  Diagnosis Date   ADHD    previously took Vivant - stopped 01/2019   Allergy    Anxiety    Depression    History of pre-eclampsia 10/21/2019   Past Surgical History:  Procedure Laterality Date   BIOPSY  08/26/2022   Procedure: BIOPSY;  Surgeon: Corbin Ade, MD;  [facility];  Service: Endoscopy;;   COLONOSCOPY WITH PROPOFOL N/A 08/26/2022   Procedure: COLONOSCOPY WITH PROPOFOL;  Surgeon: Corbin Ade, MD;  [facility];  Service: Endoscopy;  Laterality: N/A;  11:15am   COLPOSCOPY  08/15/2022   DENTAL SURGERY     ESOPHAGOGASTRODUODENOSCOPY (EGD) WITH PROPOFOL N/A 08/26/2022   Procedure: ESOPHAGOGASTRODUODENOSCOPY (EGD) WITH PROPOFOL;  Surgeon: Corbin Ade, MD;  [facility];  Service: Endoscopy;  Laterality: N/A;   THERAPEUTIC ABORTION     Family History  Problem Relation Age of Onset   Depression Mother    Diabetes Mother    Thyroid disease Mother        radiation therapy   Other Mother        gastroparesis with stimulator   Arthritis Maternal Grandmother    Stroke Maternal Grandmother    Sudden death Maternal Grandmother    Colon cancer  Neg Hx    Colon polyps Neg Hx    Celiac disease Neg Hx    Social History   Tobacco Use   Smoking status: Former    Types: E-cigarettes    Quit date: 04/2023    Years since quitting: 0.1   Smokeless tobacco: Never   Tobacco comments:    Vapes  Vaping Use   Vaping Use: Former   Quit date: 04/19/2023  Substance Use Topics   Alcohol use: No    Alcohol/week: 0.0 standard drinks of alcohol   Drug use: No   No Known Allergies Current Outpatient Medications on File Prior to Visit  Medication Sig Dispense Refill   prenatal vitamin w/FE, FA (PRENATAL 1 + 1) 27-1 MG TABS tablet Take 1 tablet by mouth daily at 12 noon.     fluticasone (FLOVENT HFA) 220 MCG/ACT inhaler Once every morning, Hold your breath. Dispense 2 puffs of the inhaler in your mouth and swallow. Follow with a glass of water. Repeat at night time (Patient not taking: Reported on 04/24/2023) 1 each 12   omeprazole (PRILOSEC) 20 MG capsule Take 20 mg by  mouth daily. (Patient not taking: Reported on 04/24/2023)     Current Facility-Administered Medications on File Prior to Visit  Medication Dose Route Frequency Provider Last Rate Last Admin   sodium chloride flush (NS) 0.9 % injection 10 mL  10 mL Intracatheter Once PRN Erenest Blank, NP       sodium chloride flush (NS) 0.9 % injection 3 mL  3 mL Intracatheter Once PRN Erenest Blank, NP        Review of Systems Pertinent items noted in HPI and remainder of comprehensive ROS otherwise negative. Physical Exam:   Vitals:   06/13/23 1539  BP: (!) 102/58  Pulse: 67  Weight: 160 lb (72.6 kg)   Fetal Heart Rate (bpm): 166 ***Bedside Ultrasound for FHR check: Viable intrauterine pregnancy with positive cardiac activity note, fetal heart rate ***bpm Patient informed that the ultrasound is considered a limited obstetric ultrasound and is not intended to be a complete ultrasound exam.  Patient also informed that the ultrasound is not being completed with the intent of assessing  for fetal or placental anomalies or any pelvic abnormalities.  Explained that the purpose of today's ultrasound is to assess for fetal heart rate.  Patient acknowledges the purpose of the exam and the limitations of the study.  Constitutional: Well-developed, well-nourished pregnant female in no acute distress.  HEENT: PERRLA Skin: normal color and turgor, no rash Cardiovascular: normal rate & rhythm, no murmur Respiratory: normal effort, lung sounds clear throughout GI: Abd soft, non-tender, pos BS x 4, gravid appropriate for gestational age MS: Extremities nontender, no edema, normal ROM Neurologic: Alert and oriented x 4.  GU: no CVA tenderness Pelvic: NEFG, physiologic discharge, no blood, cervix clean. ***Pap/swabs collected FHR:   Assessment:    Pregnancy: [id number] Patient Active Problem List   Diagnosis Date Noted   Supervision of other normal pregnancy, antepartum 06/06/2023   LGSIL on Pap smear of cervix 06/23/2022   Iron deficiency anemia 02/18/2022   Essential hypertension 12/17/2019   Prediabetes 11/18/2014   Depression, controlled 05/04/2014   Trochanteric bursitis of right hip 04/30/2014   Situational anxiety 10/08/2013     Plan:    1. Supervision of other normal pregnancy, antepartum *** - GC/Chlamydia probe amp ([place])not at [facility] - Hemoglobin A1c - CBC/D/Plt+RPR+Rh+ABO+RubIgG... - Culture, OB Urine   Initial labs drawn. Continue prenatal vitamins. Problem list reviewed and updated. Genetic Screening discussed, {Blank multiple:19196::"First trimester screen","Quad screen","NIPS"}: {requests/ordered/declines:14581}. Ultrasound discussed; fetal anatomic survey: {requests/ordered/declines:14581}. Anticipatory guidance about prenatal visits given including labs, ultrasounds, and testing. Discussed usage of Babyscripts and virtual visits as additional source of managing and completing prenatal visits in midst of coronavirus and pandemic.   Encouraged to  complete MyChart Registration for her ability to review results, send requests, and have questions addressed.  The nature of [place] [facility] with multiple MDs and Advanced Practice Providers was explained to patient; also emphasized that residents, students are part of our team. Routine obstetric precautions reviewed. Encouraged to seek out care at office or emergency room [facility] preferred) for urgent and/or emergent concerns. No follow-ups on file.     Edd Arbour, MSN, CNM, IBCLC Certified Nurse Midwife, [facility]

## 2023-06-14 LAB — CBC/D/PLT+RPR+RH+ABO+RUBIGG...
Antibody Screen: NEGATIVE
Basophils Absolute: 0 10*3/uL (ref 0.0–0.2)
Basos: 0 %
EOS (ABSOLUTE): 0.2 10*3/uL (ref 0.0–0.4)
Eos: 3 %
HCV Ab: NONREACTIVE
HIV Screen 4th Generation wRfx: NONREACTIVE
Hematocrit: 41.6 % (ref 34.0–46.6)
Hemoglobin: 14.1 g/dL (ref 11.1–15.9)
Hepatitis B Surface Ag: NEGATIVE
Immature Grans (Abs): 0 10*3/uL (ref 0.0–0.1)
Immature Granulocytes: 0 %
Lymphocytes Absolute: 1.4 10*3/uL (ref 0.7–3.1)
Lymphs: 23 %
MCH: 30.7 pg (ref 26.6–33.0)
MCHC: 33.9 g/dL (ref 31.5–35.7)
MCV: 91 fL (ref 79–97)
Monocytes Absolute: 0.5 10*3/uL (ref 0.1–0.9)
Monocytes: 7 %
Neutrophils Absolute: 4.2 10*3/uL (ref 1.4–7.0)
Neutrophils: 67 %
Platelets: 293 10*3/uL (ref 150–450)
RBC: 4.59 x10E6/uL (ref 3.77–5.28)
RDW: 12.3 % (ref 11.7–15.4)
RPR Ser Ql: NONREACTIVE
Rh Factor: POSITIVE
Rubella Antibodies, IGG: 2.18 index (ref >0.99)
WBC: 6.3 10*3/uL (ref 3.4–10.8)

## 2023-06-14 LAB — HEMOGLOBIN A1C
Est. average glucose Bld gHb Est-mCnc: 111 mg/dL
Hgb A1c MFr Bld: 5.5 % (ref 4.8–5.6)

## 2023-06-14 LAB — GC/CHLAMYDIA PROBE AMP (~~LOC~~) NOT AT ARMC
Chlamydia: NEGATIVE
Comment: NEGATIVE
Comment: NORMAL
Neisseria Gonorrhea: NEGATIVE

## 2023-06-14 LAB — HCV INTERPRETATION

## 2023-06-14 MED ORDER — ASPIRIN 81 MG PO TBEC: 81.0000 mg | DELAYED_RELEASE_TABLET | Freq: Every day | ORAL | 9 refills | Status: DC

## 2023-06-15 LAB — CULTURE, OB URINE

## 2023-06-15 LAB — URINE CULTURE, OB REFLEX

## 2023-06-21 LAB — HORIZON CUSTOM: REPORT SUMMARY: NEGATIVE

## 2023-06-23 ENCOUNTER — Encounter: Payer: Self-pay | Admitting: General Practice

## 2023-06-23 LAB — PANORAMA PRENATAL TEST FULL PANEL:PANORAMA TEST PLUS 5 ADDITIONAL MICRODELETIONS: FETAL FRACTION: 10.2

## 2023-06-29 ENCOUNTER — Inpatient Hospital Stay: Payer: Medicaid Other | Attending: Hematology & Oncology

## 2023-06-29 ENCOUNTER — Inpatient Hospital Stay: Payer: Medicaid Other | Admitting: Medical Oncology

## 2023-07-04 ENCOUNTER — Encounter (HOSPITAL_COMMUNITY): Payer: Self-pay | Admitting: Psychiatry

## 2023-07-04 ENCOUNTER — Telehealth (HOSPITAL_BASED_OUTPATIENT_CLINIC_OR_DEPARTMENT_OTHER): Payer: Medicaid Other | Admitting: Psychiatry

## 2023-07-04 VITALS — Wt 160.0 lb

## 2023-07-04 DIAGNOSIS — F419 Anxiety disorder, unspecified: Secondary | ICD-10-CM | POA: Diagnosis not present

## 2023-07-04 DIAGNOSIS — F331 Major depressive disorder, recurrent, moderate: Secondary | ICD-10-CM

## 2023-07-04 DIAGNOSIS — F902 Attention-deficit hyperactivity disorder, combined type: Secondary | ICD-10-CM

## 2023-07-04 NOTE — Progress Notes (Signed)
[facility] MD Virtual Progress Note   Patient Location: Home Provider Location: Home Office  I connect with patient by telephone and verified that I am speaking with correct person by using two identifiers. I discussed the limitations of evaluation and management by telemedicine and the availability of in person appointments. I also discussed with the patient that there may be a patient responsible charge related to this service. The patient expressed understanding and agreed to proceed.  Madeline Mccormick [id number] 27 y.o.  07/04/2023 3:33 PM  History of Present Illness:  Patient is evaluated by phone session.  She is doing very well with her pregnancy.  Her due date is an second week of January.  She decided as having a boy.  She had a good support from her family members and boyfriend.  She denies any crying spells or any feeling of hopelessness or worthlessness.  She denies any panic attack.  Sometimes she struggled with attention but symptoms are manageable.  She continues to work part-time.  She is hoping to finish her school at the end of this December.  She is getting regular visit with OB/GYN.  Her appetite is okay.  Sometimes she is tired but no major concern as per patient.  She is without psychotropic medication and understand that she will consider taking the medication after the delivery.  She has no hallucination, paranoia, agitation, suicidal thoughts.  Recently she had a blood work and her hemoglobin A1c normal.  She denies drinking or using any illegal substances.  Past Psychiatric History: H/O ADHD diagnosed by PCP but no formal psychological testing. H/O anxiety and depression. Good response with Vyvanse, Lexapro and Abilify. Meds d/c due to pregnancy. Tried BuSpar and Lamictal. Wellbutrin did not work. No h/o suicidal attempt, inpatient treatment, hallucination, OCD, PTSD or panic attacks.    Outpatient Encounter Medications as of 07/04/2023  Medication Sig    aspirin EC 81 MG tablet Take 1 tablet (81 mg total) by mouth daily. Start after [redacted] weeks gestation   fluticasone (FLOVENT HFA) 220 MCG/ACT inhaler Once every morning, Hold your breath. Dispense 2 puffs of the inhaler in your mouth and swallow. Follow with a glass of water. Repeat at night time (Patient not taking: Reported on 04/24/2023)   omeprazole (PRILOSEC) 20 MG capsule Take 20 mg by mouth daily. (Patient not taking: Reported on 04/24/2023)   prenatal vitamin w/FE, FA (PRENATAL 1 + 1) 27-1 MG TABS tablet Take 1 tablet by mouth daily at 12 noon.   Facility-Administered Encounter Medications as of 07/04/2023  Medication   sodium chloride flush (NS) 0.9 % injection 10 mL   sodium chloride flush (NS) 0.9 % injection 3 mL    Recent Results (from the past 2160 hour(s))  Pregnancy, urine POC     Status: Abnormal   Collection Time: 04/24/23  1:55 PM  Result Value Ref Range   Preg Test, Ur POSITIVE (A) NEGATIVE    Comment:        THE SENSITIVITY OF THIS METHODOLOGY IS >24 mIU/mL   GC/Chlamydia probe amp ([place])not at [facility]     Status: None   Collection Time: 06/13/23  4:13 PM  Result Value Ref Range   Neisseria Gonorrhea Negative    Chlamydia Negative    Comment Normal Reference Ranger Chlamydia - Negative    Comment      Normal Reference Range Neisseria Gonorrhea - Negative  HORIZON Basic Panel     Status: None   Collection Time: 06/13/23  4:15 PM  Result Value Ref Range   REPORT SUMMARY Negative     Comment: Negative for 4 out of 4 diseases. Alpha-Thalassemia : Negative Beta-Hemoglobinopathies : Negative Cystic Fibrosis : Negative Spinal Muscular Atrophy : Negative NEGATIVE Spinal Muscular Atrophy (SMA) Results SMN1: Two copies; g.27134T>G: absent; the absence of the g.27134T>G variant decreases the chance to be a silent (2+0) carrier.   PANEL NAME HCS_OTHER    PANEL NOTES See Notes    REPORT NOTE See Notes    FOOTNOTES See Notes     Comment: Please see the attached PDF for  information regarding Conditions, Methodology, Disclaimers, and further information. Test performed by [facility] : [address], [place], [place] [address] CLIA ID #[id number] CLIA Lab Director: Arnoldo Morale, Ph.D., [facility]  PANORAMA PRENATAL TEST     Status: None   Collection Time: 06/13/23  4:15 PM  Result Value Ref Range   REPORT SUMMARY LOW RISK     Comment: LOW RISK   REPORT NOTE See Notes    GENDER OF FETUS Female    FETAL FRACTION 10.2%    TRISOMY 21 RESULT TEXT Low Risk    TRISOMY 21 AGE-BASED RISK TEXT 1/946 (0.11%)    TRISOMY 21 RISK SCORE TEXT <1/10,000 (<0.01%)    TRISOMY 18 RESULT TEXT Low Risk    TRISOMY 18 AGE-BASED RISK TEXT 1/2,200 (0.05%)    TRISOMY 18 RISK SCORE TEXT <1/10,000 (<0.01%)    TRISOMY 13 RESULT TEXT Low Risk    TRISOMY 13 AGE-BASED RISK TEXT 1/6,930 (0.01%)    TRISOMY 13 RISK SCORE TEXT <1/10,000 (<0.01%)    MONOSOMY X RESULT TEXT Low Risk    MONOSOMY X AGE-BASED RISK TEXT 1/255 (0.39%)    MONOSOMY X RISK SCORE TEXT <1/10,000 (<0.01%)    TRIPLOIDY RESULT TEXT Low Risk    FOOTNOTES See Notes     Comment: Testing Methodology DNA isolated from maternal blood, which contains placental DNA, is amplified at specific loci using a targeted PCR assay and is sequenced using a high-throughput sequencer. Fetal fraction is determined using a proprietary algorithm incorporating data from single nucleotide polymorphism-based (SNP-based) next-generation sequencing [Pergament E et al. Obstet Gynecol. 2014 Aug;124(2 Pt 1):210-8]. If there is sufficient fetal fraction, sequencing data is analyzed using a proprietary SNP-based algorithm to determine the fetal copy number for chromosomes 13, 18, 21, X and Y. If ordered, specific microdeletions will be evaluated using similar methodology [Wapner RJ et al. Am J Obstet Gynecol. 2015 Mar;212(3):332.e1-9]. If the fetal fraction is insufficient, fetal signal enhancement and/or an additional algorithm to determine  whether there is an increased risk for triploidy, trisomy 43, and trisomy 13 may be utilized Verlon Au et al. Ultrasound Obstet Gynecol 2019;  53:73-79]. However, some  samples will not produce a result due to failure to meet the necessary quality thresholds.  This test has been validated on women with a singleton, twin or egg donor pregnancy of at least nine weeks gestation. A result will not be available for higher order multiples and multiple gestation pregnancies with an egg donor or surrogate, or bone marrow transplant recipients. Complete test panel is not available for twin gestations and pregnancies achieved with an egg donor or surrogate. For twin pregnancies with a fetal fraction value below the threshold for analysis, a sum of the fetal fractions for both twins will be reported. As this assay is a screening test and not diagnostic, false positives and false negatives can occur. High risk test  results need diagnostic confirmation by alternative testing methods. Low risk results do not fully exclude the diagnosis of any of the syndromes nor do they exclude the possibility of other chromosomal abnormalities or  birth defects, which are not a part of this test. Potential sources of inaccurate results include, but are not limited to,  mosaicism, low fetal fraction, limitations of current diagnostic techniques, or misidentification of samples. This test will not identify all deletions associated with each microdeletion syndrome. This test has been validated for deletions  > = 0.5 Mb within the 22q11.2 A-D region. This test has been validated on full region deletions only for 1p36 deletion syndrome, Cri-du-chat syndrome, Prader Willi syndrome and Angelman syndrome and may be unable to detect smaller deletions. Microdeletion risk score may be dependent upon fetal fraction, as deletions on the maternally inherited copy are difficult to identify at lower fetal fractions. Test results should always be interpreted  by a clinician in the context of clinical and familial data with the availability of genetic counseling when appropriate. ---------------- Disclaimers The extraction, Health visitor, and sequencing of this test were performed by [facility] [address], [place], [place] [address] (CLIA [place] [id number]). The data analysis and reporting of this test were performed by [facility] [address], [place], [place] [address] (CLIA [place] [id number]). The performance characteristics of this test were developed by [facility]CLIA ID [id number]). This test has not been cleared or approved by the U.S. Food and Drug Administration (FDA). These laboratories are regulated under CLIA as qualified to perform high-complexity testing.  2021 [employer] All Rights Reserved. Please refer to the attached PDF report Reviewed By: Haze Boyden, M.D., Ph.D., [facility], Senior Laboratory Director CLIA Lab Director: Arnoldo Morale, Ph.D., [facility] IF THE ORDERING PROVIDER HAS QUESTIONS OR WISHES TO DISCUSS THE RESULTS, PLEASE CONTACT us AT [phone] #[phone]. Ask for the NIPT genetic counselor on call.   Culture, OB Urine     Status: None   Collection Time: 06/13/23  4:27 PM   Specimen: Blood   UC  Result Value Ref Range   Urine Culture, OB Final report   Urine Culture, OB Reflex     Status: None   Collection Time: 06/13/23  4:27 PM  Result Value Ref Range   Organism ID, Bacteria Comment     Comment: Mixed urogenital flora Less than 10,000 colonies/mL   Hemoglobin A1c     Status: None   Collection Time: 06/13/23  4:29 PM  Result Value Ref Range   Hgb A1c MFr Bld 5.5 4.8 - 5.6 %    Comment:          Prediabetes: 5.7 - 6.4          Diabetes: >6.4          Glycemic control for adults with diabetes: <7.0    Est. average glucose Bld gHb Est-mCnc 111 mg/dL  CBC/D/Plt+RPR+Rh+ABO+RubIgG...     Status: None   Collection Time: 06/13/23  4:29 PM  Result Value Ref Range   Hepatitis B Surface Ag  Negative Negative   HCV Ab Non Reactive Non Reactive   RPR Ser Ql Non Reactive Non Reactive   Rubella Antibodies, IGG 2.18 Immune >0.99 index    Comment:                                 Non-immune       <  0.90                                 Equivocal  0.90 - 0.99                                 Immune           >0.99    ABO Grouping A    Rh Factor Positive     Comment: Please note: Prior records for this patient's ABO / Rh type are not available for additional verification.    Antibody Screen Negative Negative   HIV Screen 4th Generation wRfx Non Reactive Non Reactive    Comment: HIV-1/HIV-2 antibodies and HIV-1 p24 antigen were NOT detected. There is no laboratory evidence of HIV infection. HIV Negative    WBC 6.3 3.4 - 10.8 x10E3/uL   RBC 4.59 3.77 - 5.28 x10E6/uL   Hemoglobin 14.1 11.1 - 15.9 g/dL   Hematocrit 63.8 75.6 - 46.6 %   MCV 91 79 - 97 fL   MCH 30.7 26.6 - 33.0 pg   MCHC 33.9 31.5 - 35.7 g/dL   RDW 43.3 29.5 - 18.8 %   Platelets 293 150 - 450 x10E3/uL   Neutrophils 67 Not Estab. %   Lymphs 23 Not Estab. %   Monocytes 7 Not Estab. %   Eos 3 Not Estab. %   Basos 0 Not Estab. %   Neutrophils Absolute 4.2 1.4 - 7.0 x10E3/uL   Lymphocytes Absolute 1.4 0.7 - 3.1 x10E3/uL   Monocytes Absolute 0.5 0.1 - 0.9 x10E3/uL   EOS (ABSOLUTE) 0.2 0.0 - 0.4 x10E3/uL   Basophils Absolute 0.0 0.0 - 0.2 x10E3/uL   Immature Granulocytes 0 Not Estab. %   Immature Grans (Abs) 0.0 0.0 - 0.1 x10E3/uL  Interpretation:     Status: None   Collection Time: 06/13/23  4:29 PM  Result Value Ref Range   HCV Interp 1: Comment     Comment: Not infected with HCV unless early or acute infection is suspected (which may be delayed in an immunocompromised individual), or other evidence exists to indicate HCV infection.      Psychiatric Specialty Exam: Physical Exam  Review of Systems  Weight 160 lb (72.6 kg).There is no height or weight on file to calculate BMI.  General Appearance: NA   Eye Contact:  NA  Speech:  Clear and Coherent and Normal Rate  Volume:  Normal  Mood:  Euthymic  Affect:  Appropriate  Thought Process:  Goal Directed  Orientation:  Full (Time, Place, and Person)  Thought Content:  WDL  Suicidal Thoughts:  No  Homicidal Thoughts:  No  Memory:  Immediate;   Good Recent;   Good Remote;   Good  Judgement:  Intact  Insight:  Present  Psychomotor Activity:  Normal  Concentration:  Concentration: Good and Attention Span: Good  Recall:  Good  Fund of Knowledge:  Good  Language:  Good  Akathisia:  No  Handed:  Right  AIMS (if indicated):     Assets:  Communication Skills Desire for Improvement Housing Resilience Social Support Talents/Skills Transportation  ADL's:  Intact  Cognition:  WNL  Sleep:  fair     Assessment/Plan: MDD (major depressive disorder), recurrent episode, moderate (HCC)  Attention deficit hyperactivity disorder (ADHD), combined type  Anxiety  Patient is pregnant and not taking any  psychotropic medication.  Her symptoms are stable.  She is keeping appointment with her OB/GYN.  Her due date is in January 2025.  I recommend to call us back if she feels worsening of symptoms otherwise we will follow-up in 6 months.  Patient agreed with the plan.   Follow Up Instructions:     I discussed the assessment and treatment plan with the patient. The patient was provided an opportunity to ask questions and all were answered. The patient agreed with the plan and demonstrated an understanding of the instructions.   The patient was advised to call back or seek an in-person evaluation if the symptoms worsen or if the condition fails to improve as anticipated.    Collaboration of Care: Other provider involved in patient's care AEB notes are available in epic to review.  Patient/Guardian was advised Release of Information must be obtained prior to any record release in order to collaborate their care with an outside provider.  Patient/Guardian was advised if they have not already done so to contact the registration department to sign all necessary forms in order for [place] to release information regarding their care.   Consent: Patient/Guardian gives verbal consent for treatment and assignment of benefits for services provided during this visit. Patient/Guardian expressed understanding and agreed to proceed.     Note: This document was prepared by [employer] voice dictation technology and any errors that results from this process are unintentional.    Cleotis Nipper, MD 07/04/2023

## 2023-07-12 ENCOUNTER — Ambulatory Visit (INDEPENDENT_AMBULATORY_CARE_PROVIDER_SITE_OTHER): Payer: Medicaid Other | Admitting: Obstetrics & Gynecology

## 2023-07-12 VITALS — BP 100/65 | HR 63 | Wt 159.0 lb

## 2023-07-12 DIAGNOSIS — Z3A15 15 weeks gestation of pregnancy: Secondary | ICD-10-CM

## 2023-07-12 DIAGNOSIS — Z3480 Encounter for supervision of other normal pregnancy, unspecified trimester: Secondary | ICD-10-CM

## 2023-07-12 DIAGNOSIS — Z3482 Encounter for supervision of other normal pregnancy, second trimester: Secondary | ICD-10-CM

## 2023-07-12 NOTE — Progress Notes (Signed)
   PRENATAL VISIT NOTE  Subjective:  Madeline Mccormick is a 27 y.o. G4P1021 at [redacted]w[redacted]d being seen today for ongoing prenatal care.  She is currently monitored for the following issues for this high-risk pregnancy and has Situational anxiety; Trochanteric bursitis of right hip; Depression, controlled; Essential hypertension; Iron deficiency anemia; LGSIL on Pap smear of cervix; and Supervision of other normal pregnancy, antepartum on their problem list.  Patient reports no complaints.  Contractions: Not present. Vag. Bleeding: None.  Movement: Absent. Denies leaking of fluid.   The following portions of the patient's history were reviewed and updated as appropriate: allergies, current medications, past family history, past medical history, past social history, past surgical history and problem list.   Objective:   Vitals:   07/12/23 1444  BP: 100/65  Pulse: 63  Weight: 159 lb (72.1 kg)    Fetal Status: Fetal Heart Rate (bpm): 145   Movement: Absent     General:  Alert, oriented and cooperative. Patient is in no acute distress.  Skin: Skin is warm and dry. No rash noted.   Cardiovascular: Normal heart rate noted  Respiratory: Normal respiratory effort, no problems with respiration noted  Abdomen: Soft, gravid, appropriate for gestational age.  Pain/Pressure: Absent     Pelvic: Cervical exam deferred        Extremities: Normal range of motion.     Mental Status: Normal mood and affect. Normal behavior. Normal judgment and thought content.   Assessment and Plan:  Pregnancy: G4P1021 at 109w2d 1. Supervision of other normal pregnancy, antepartum Screen for ONTD - AFP, Serum, Open Spina Bifida  Preterm labor symptoms and general obstetric precautions including but not limited to vaginal bleeding, contractions, leaking of fluid and fetal movement were reviewed in detail with the patient. Please refer to After Visit Summary for other counseling recommendations.   Return in about 4 weeks (around  08/09/2023).  Future Appointments  Date Time Provider Department Center  08/08/2023  9:30 AM [facility] NURSE [facility]  08/08/2023  9:45 AM [facility]  01/04/2024  2:00 PM Arfeen, Phillips Grout, MD [facility] None    Scheryl Darter, MD

## 2023-08-02 DIAGNOSIS — O09299 Supervision of pregnancy with other poor reproductive or obstetric history, unspecified trimester: Secondary | ICD-10-CM | POA: Insufficient documentation

## 2023-08-08 ENCOUNTER — Ambulatory Visit: Payer: Medicaid Other | Attending: Family Medicine

## 2023-08-08 ENCOUNTER — Ambulatory Visit: Payer: Medicaid Other | Admitting: *Deleted

## 2023-08-08 ENCOUNTER — Other Ambulatory Visit: Payer: Self-pay | Admitting: *Deleted

## 2023-08-08 VITALS — BP 110/57 | HR 63

## 2023-08-08 DIAGNOSIS — O09292 Supervision of pregnancy with other poor reproductive or obstetric history, second trimester: Secondary | ICD-10-CM | POA: Insufficient documentation

## 2023-08-08 DIAGNOSIS — O09299 Supervision of pregnancy with other poor reproductive or obstetric history, unspecified trimester: Secondary | ICD-10-CM

## 2023-08-08 DIAGNOSIS — Z3A19 19 weeks gestation of pregnancy: Secondary | ICD-10-CM | POA: Insufficient documentation

## 2023-08-08 DIAGNOSIS — Z3480 Encounter for supervision of other normal pregnancy, unspecified trimester: Secondary | ICD-10-CM | POA: Diagnosis not present

## 2023-08-08 DIAGNOSIS — O358XX0 Maternal care for other (suspected) fetal abnormality and damage, not applicable or unspecified: Secondary | ICD-10-CM | POA: Insufficient documentation

## 2023-08-08 DIAGNOSIS — Z363 Encounter for antenatal screening for malformations: Secondary | ICD-10-CM | POA: Diagnosis present

## 2023-08-10 ENCOUNTER — Ambulatory Visit (INDEPENDENT_AMBULATORY_CARE_PROVIDER_SITE_OTHER): Payer: Medicaid Other | Admitting: Obstetrics and Gynecology

## 2023-08-10 ENCOUNTER — Other Ambulatory Visit: Payer: Self-pay

## 2023-08-10 VITALS — BP 100/63 | HR 63 | Wt 165.6 lb

## 2023-08-10 DIAGNOSIS — O09299 Supervision of pregnancy with other poor reproductive or obstetric history, unspecified trimester: Secondary | ICD-10-CM

## 2023-08-10 DIAGNOSIS — O09292 Supervision of pregnancy with other poor reproductive or obstetric history, second trimester: Secondary | ICD-10-CM

## 2023-08-10 DIAGNOSIS — Z3A19 19 weeks gestation of pregnancy: Secondary | ICD-10-CM

## 2023-08-10 DIAGNOSIS — Z3480 Encounter for supervision of other normal pregnancy, unspecified trimester: Secondary | ICD-10-CM

## 2023-08-10 NOTE — Progress Notes (Signed)
   PRENATAL VISIT NOTE  Subjective:  Madeline Mccormick is a 27 y.o. G4P1021 at [redacted]w[redacted]d being seen today for ongoing prenatal care.  She is currently monitored for the following issues for this low-risk pregnancy and has Essential hypertension; Iron deficiency anemia; Supervision of other normal pregnancy, antepartum; and History of pre-eclampsia in prior pregnancy, currently pregnant on their problem list.  Patient reports no complaints.  Contractions: Not present. Vag. Bleeding: None.  Movement: Present. Denies leaking of fluid.   The following portions of the patient's history were reviewed and updated as appropriate: allergies, current medications, past family history, past medical history, past social history, past surgical history and problem list.   Objective:   Vitals:   08/10/23 1435  BP: 100/63  Pulse: 63  Weight: 165 lb 9.6 oz (75.1 kg)    Fetal Status: Fetal Heart Rate (bpm): 154   Movement: Present     General:  Alert, oriented and cooperative. Patient is in no acute distress.  Skin: Skin is warm and dry. No rash noted.   Cardiovascular: Normal heart rate noted  Respiratory: Normal respiratory effort, no problems with respiration noted  Abdomen: Soft, gravid, appropriate for gestational age.  Pain/Pressure: Absent     Pelvic: Cervical exam deferred        Extremities: Normal range of motion.  Edema: None  Mental Status: Normal mood and affect. Normal behavior. Normal judgment and thought content.   Assessment and Plan:  Pregnancy: G4P1021 at [redacted]w[redacted]d 1. Supervision of other normal pregnancy, antepartum BP and FHR normal Feeling regular fetal movement 8/20 u/s EIF, LR nips Follow up in 4 weeks to view anatomy   2. Hx of preeclampsia, prior pregnancy, currently pregnant Normotensive, continue ASA Checking bp at home  Precautions discussed when to follow up or go to hospital  3. [redacted] weeks gestation of pregnancy Routine care  Preterm labor symptoms and general obstetric  precautions including but not limited to vaginal bleeding, contractions, leaking of fluid and fetal movement were reviewed in detail with the patient. Please refer to After Visit Summary for other counseling recommendations.   Return in 4 weeks for routine prenatal   Future Appointments  Date Time Provider Department Center  09/04/2023  3:15 PM Hermina Staggers, MD [facility]  09/12/2023  9:30 AM [facility] US3 [facility]  01/04/2024  2:00 PM Arfeen, Phillips Grout, MD [facility] None    Albertine Grates, FNP

## 2023-09-04 ENCOUNTER — Ambulatory Visit (INDEPENDENT_AMBULATORY_CARE_PROVIDER_SITE_OTHER): Payer: Medicaid Other | Admitting: Obstetrics and Gynecology

## 2023-09-04 ENCOUNTER — Encounter: Payer: Self-pay | Admitting: Obstetrics and Gynecology

## 2023-09-04 ENCOUNTER — Other Ambulatory Visit: Payer: Self-pay

## 2023-09-04 VITALS — BP 103/68 | HR 64 | Wt 172.1 lb

## 2023-09-04 DIAGNOSIS — O09292 Supervision of pregnancy with other poor reproductive or obstetric history, second trimester: Secondary | ICD-10-CM

## 2023-09-04 DIAGNOSIS — O09299 Supervision of pregnancy with other poor reproductive or obstetric history, unspecified trimester: Secondary | ICD-10-CM

## 2023-09-04 DIAGNOSIS — Z3480 Encounter for supervision of other normal pregnancy, unspecified trimester: Secondary | ICD-10-CM

## 2023-09-04 DIAGNOSIS — Z3A23 23 weeks gestation of pregnancy: Secondary | ICD-10-CM

## 2023-09-04 NOTE — Progress Notes (Signed)
08/25/23 --Pt fell & went to OR @ [facility] in [place]

## 2023-09-04 NOTE — Progress Notes (Signed)
Subjective:  Madeline Mccormick is a 27 y.o. G4P1021 at [redacted]w[redacted]d being seen today for ongoing prenatal care.  She is currently monitored for the following issues for this low-risk pregnancy and has Essential hypertension; Iron deficiency anemia; Supervision of other normal pregnancy, antepartum; and History of pre-eclampsia in prior pregnancy, currently pregnant on their problem list.  Patient reports no complaints.  Contractions: Not present. Vag. Bleeding: None.  Movement: Present. Denies leaking of fluid.   The following portions of the patient's history were reviewed and updated as appropriate: allergies, current medications, past family history, past medical history, past social history, past surgical history and problem list. Problem list updated.  Objective:   Vitals:   09/04/23 1520  BP: 103/68  Pulse: 64  Weight: 78.1 kg    Fetal Status: Fetal Heart Rate (bpm): 132 Fundal Height: 23 cm Movement: Present     General:  Alert, oriented and cooperative. Patient is in no acute distress.  Skin: Skin is warm and dry. No rash noted.   Cardiovascular: Normal heart rate noted  Respiratory: Normal respiratory effort, no problems with respiration noted  Abdomen: Soft, gravid, appropriate for gestational age. Pain/Pressure: Present     Pelvic:  Cervical exam deferred        Extremities: Normal range of motion.  Edema: None  Mental Status: Normal mood and affect. Normal behavior. Normal judgment and thought content.   Urinalysis:      Assessment and Plan:  Pregnancy: G4P1021 at [redacted]w[redacted]d  1. Supervision of other normal pregnancy, antepartum Stable Glucola next visit  2. History of pre-eclampsia in prior pregnancy, currently pregnant BP stable No S/Sx at present Continue with every day BASA  Preterm labor symptoms and general obstetric precautions including but not limited to vaginal bleeding, contractions, leaking of fluid and fetal movement were reviewed in detail with the patient. Please  refer to After Visit Summary for other counseling recommendations.  Return in about 4 weeks (around 10/02/2023) for OB visit, face to face, any provider, fasting for Glucola.   Hermina Staggers, MD

## 2023-09-12 ENCOUNTER — Other Ambulatory Visit: Payer: Self-pay | Admitting: *Deleted

## 2023-09-12 ENCOUNTER — Ambulatory Visit: Payer: Medicaid Other | Attending: Obstetrics and Gynecology

## 2023-09-12 DIAGNOSIS — O09299 Supervision of pregnancy with other poor reproductive or obstetric history, unspecified trimester: Secondary | ICD-10-CM

## 2023-09-12 DIAGNOSIS — O10912 Unspecified pre-existing hypertension complicating pregnancy, second trimester: Secondary | ICD-10-CM | POA: Insufficient documentation

## 2023-09-12 DIAGNOSIS — O365920 Maternal care for other known or suspected poor fetal growth, second trimester, not applicable or unspecified: Secondary | ICD-10-CM

## 2023-09-12 DIAGNOSIS — O09292 Supervision of pregnancy with other poor reproductive or obstetric history, second trimester: Secondary | ICD-10-CM | POA: Insufficient documentation

## 2023-09-12 DIAGNOSIS — Z3A24 24 weeks gestation of pregnancy: Secondary | ICD-10-CM | POA: Diagnosis not present

## 2023-09-12 DIAGNOSIS — O10012 Pre-existing essential hypertension complicating pregnancy, second trimester: Secondary | ICD-10-CM

## 2023-09-12 DIAGNOSIS — O35BXX0 Maternal care for other (suspected) fetal abnormality and damage, fetal cardiac anomalies, not applicable or unspecified: Secondary | ICD-10-CM | POA: Diagnosis not present

## 2023-09-26 ENCOUNTER — Ambulatory Visit: Payer: Medicaid Other | Attending: Maternal & Fetal Medicine

## 2023-09-26 DIAGNOSIS — O09292 Supervision of pregnancy with other poor reproductive or obstetric history, second trimester: Secondary | ICD-10-CM

## 2023-09-26 DIAGNOSIS — Z3A26 26 weeks gestation of pregnancy: Secondary | ICD-10-CM

## 2023-09-26 DIAGNOSIS — O365920 Maternal care for other known or suspected poor fetal growth, second trimester, not applicable or unspecified: Secondary | ICD-10-CM

## 2023-09-26 DIAGNOSIS — O09299 Supervision of pregnancy with other poor reproductive or obstetric history, unspecified trimester: Secondary | ICD-10-CM | POA: Insufficient documentation

## 2023-09-26 DIAGNOSIS — O10012 Pre-existing essential hypertension complicating pregnancy, second trimester: Secondary | ICD-10-CM | POA: Diagnosis present

## 2023-09-26 DIAGNOSIS — O35BXX0 Maternal care for other (suspected) fetal abnormality and damage, fetal cardiac anomalies, not applicable or unspecified: Secondary | ICD-10-CM | POA: Diagnosis not present

## 2023-09-28 ENCOUNTER — Other Ambulatory Visit: Payer: Self-pay

## 2023-09-28 DIAGNOSIS — Z3480 Encounter for supervision of other normal pregnancy, unspecified trimester: Secondary | ICD-10-CM

## 2023-10-02 ENCOUNTER — Other Ambulatory Visit: Payer: Self-pay

## 2023-10-02 ENCOUNTER — Ambulatory Visit (INDEPENDENT_AMBULATORY_CARE_PROVIDER_SITE_OTHER): Payer: Medicaid Other | Admitting: Obstetrics and Gynecology

## 2023-10-02 ENCOUNTER — Other Ambulatory Visit: Payer: Medicaid Other

## 2023-10-02 VITALS — BP 113/46 | HR 59 | Wt 177.7 lb

## 2023-10-02 DIAGNOSIS — O365920 Maternal care for other known or suspected poor fetal growth, second trimester, not applicable or unspecified: Secondary | ICD-10-CM

## 2023-10-02 DIAGNOSIS — Z23 Encounter for immunization: Secondary | ICD-10-CM | POA: Diagnosis not present

## 2023-10-02 DIAGNOSIS — Z3A27 27 weeks gestation of pregnancy: Secondary | ICD-10-CM

## 2023-10-02 DIAGNOSIS — Z3480 Encounter for supervision of other normal pregnancy, unspecified trimester: Secondary | ICD-10-CM

## 2023-10-02 DIAGNOSIS — O365990 Maternal care for other known or suspected poor fetal growth, unspecified trimester, not applicable or unspecified: Secondary | ICD-10-CM | POA: Insufficient documentation

## 2023-10-02 DIAGNOSIS — O09299 Supervision of pregnancy with other poor reproductive or obstetric history, unspecified trimester: Secondary | ICD-10-CM

## 2023-10-02 DIAGNOSIS — O09292 Supervision of pregnancy with other poor reproductive or obstetric history, second trimester: Secondary | ICD-10-CM

## 2023-10-02 NOTE — Progress Notes (Signed)
   PRENATAL VISIT NOTE  Subjective:  Madeline Mccormick is a 27 y.o. G4P1021 at [redacted]w[redacted]d being seen today for ongoing prenatal care.  She is currently monitored for the following issues for this high-risk pregnancy and has Iron deficiency anemia; Supervision of other normal pregnancy, antepartum; History of pre-eclampsia in prior pregnancy, currently pregnant; and Fetal growth restriction antepartum on their problem list.  Patient doing well with no acute concerns today. She reports no complaints.  Contractions: Not present. Vag. Bleeding: None.  Movement: Present. Denies leaking of fluid.   The following portions of the patient's history were reviewed and updated as appropriate: allergies, current medications, past family history, past medical history, past social history, past surgical history and problem list. Problem list updated.  Objective:   Vitals:   10/02/23 0836  BP: (!) 113/46  Pulse: (!) 59  Weight: 177 lb 11.2 oz (80.6 kg)    Fetal Status: Fetal Heart Rate (bpm): 142 Fundal Height: 25 cm Movement: Present     General:  Alert, oriented and cooperative. Patient is in no acute distress.  Skin: Skin is warm and dry. No rash noted.   Cardiovascular: Normal heart rate noted  Respiratory: Normal respiratory effort, no problems with respiration noted  Abdomen: Soft, gravid, appropriate for gestational age.  Pain/Pressure: Present     Pelvic: Cervical exam deferred        Extremities: Normal range of motion.  Edema: None  Mental Status:  Normal mood and affect. Normal behavior. Normal judgment and thought content.   Assessment and Plan:  Pregnancy: G4P1021 at [redacted]w[redacted]d  1. Supervision of other normal pregnancy, antepartum Continue routine prenatal care 2 hour GTT and third trimester labs today - Tdap vaccine greater than or equal to 7yo IM  2. [redacted] weeks gestation of pregnancy   3. History of pre-eclampsia in prior pregnancy, currently pregnant Pt compliant with baby ASA, no s/sx of  preeclampsia  4. Fetal growth restriction antepartum Dopplers and growth scan on 10/03/23  Preterm labor symptoms and general obstetric precautions including but not limited to vaginal bleeding, contractions, leaking of fluid and fetal movement were reviewed in detail with the patient.  Please refer to After Visit Summary for other counseling recommendations.   Return in about 2 weeks (around 10/16/2023) for ROB, in person.   Mariel Aloe, MD Faculty Attending Center for [facility]

## 2023-10-03 ENCOUNTER — Other Ambulatory Visit: Payer: Self-pay

## 2023-10-03 ENCOUNTER — Ambulatory Visit: Payer: Medicaid Other | Attending: Maternal & Fetal Medicine

## 2023-10-03 ENCOUNTER — Ambulatory Visit: Payer: Medicaid Other | Admitting: *Deleted

## 2023-10-03 DIAGNOSIS — O35BXX0 Maternal care for other (suspected) fetal abnormality and damage, fetal cardiac anomalies, not applicable or unspecified: Secondary | ICD-10-CM

## 2023-10-03 DIAGNOSIS — Z3A27 27 weeks gestation of pregnancy: Secondary | ICD-10-CM

## 2023-10-03 DIAGNOSIS — O368120 Decreased fetal movements, second trimester, not applicable or unspecified: Secondary | ICD-10-CM | POA: Diagnosis not present

## 2023-10-03 DIAGNOSIS — O09299 Supervision of pregnancy with other poor reproductive or obstetric history, unspecified trimester: Secondary | ICD-10-CM | POA: Insufficient documentation

## 2023-10-03 DIAGNOSIS — O09292 Supervision of pregnancy with other poor reproductive or obstetric history, second trimester: Secondary | ICD-10-CM

## 2023-10-03 DIAGNOSIS — O10012 Pre-existing essential hypertension complicating pregnancy, second trimester: Secondary | ICD-10-CM | POA: Diagnosis present

## 2023-10-03 DIAGNOSIS — O365920 Maternal care for other known or suspected poor fetal growth, second trimester, not applicable or unspecified: Secondary | ICD-10-CM | POA: Diagnosis present

## 2023-10-03 LAB — CBC
Hematocrit: 36.5 % (ref 34.0–46.6)
Hemoglobin: 12 g/dL (ref 11.1–15.9)
MCH: 31.1 pg (ref 26.6–33.0)
MCHC: 32.9 g/dL (ref 31.5–35.7)
MCV: 95 fL (ref 79–97)
Platelets: 243 10*3/uL (ref 150–450)
RBC: 3.86 x10E6/uL (ref 3.77–5.28)
RDW: 12.6 % (ref 11.7–15.4)
WBC: 8 10*3/uL (ref 3.4–10.8)

## 2023-10-03 LAB — HIV ANTIBODY (ROUTINE TESTING W REFLEX): HIV Screen 4th Generation wRfx: NONREACTIVE

## 2023-10-03 LAB — RPR: RPR Ser Ql: NONREACTIVE

## 2023-10-03 LAB — GLUCOSE TOLERANCE, 2 HOURS W/ 1HR
Glucose, 1 hour: 128 mg/dL (ref 70–179)
Glucose, 2 hour: 120 mg/dL (ref 70–152)
Glucose, Fasting: 81 mg/dL (ref 70–91)

## 2023-10-03 NOTE — Procedures (Signed)
Madeline Mccormick December 26, 1995 [redacted]w[redacted]d  Fetus A Non-Stress Test Interpretation for 10/03/23-NST with UAD  Indication: IUGR  Fetal Heart Rate A Mode: External Baseline Rate (A): 140 bpm Variability: Moderate Accelerations: 15 x 15 Decelerations: None Multiple birth?: No  Uterine Activity Mode: Toco Contraction Frequency (min): none Resting Tone Palpated: Relaxed  Interpretation (Fetal Testing) Nonstress Test Interpretation: Reactive Comments: Tracing reviewed by Dr.Schaible

## 2023-10-05 ENCOUNTER — Other Ambulatory Visit: Payer: Self-pay | Admitting: *Deleted

## 2023-10-05 DIAGNOSIS — O365990 Maternal care for other known or suspected poor fetal growth, unspecified trimester, not applicable or unspecified: Secondary | ICD-10-CM

## 2023-10-10 ENCOUNTER — Other Ambulatory Visit: Payer: Self-pay

## 2023-10-10 ENCOUNTER — Ambulatory Visit: Payer: Medicaid Other | Attending: Maternal & Fetal Medicine | Admitting: *Deleted

## 2023-10-10 VITALS — BP 107/57

## 2023-10-10 DIAGNOSIS — O365930 Maternal care for other known or suspected poor fetal growth, third trimester, not applicable or unspecified: Secondary | ICD-10-CM

## 2023-10-10 DIAGNOSIS — Z3A28 28 weeks gestation of pregnancy: Secondary | ICD-10-CM

## 2023-10-10 NOTE — Procedures (Signed)
Madeline Mccormick 1996-07-02 [redacted]w[redacted]d  Fetus A Non-Stress Test Interpretation for 10/10/23 (NST only)  Indication: IUGR  Fetal Heart Rate A Mode: External Baseline Rate (A): 135 bpm Variability: Moderate Accelerations: 15 x 15 Decelerations: None Multiple birth?: No  Uterine Activity Mode: Palpation, Toco Contraction Frequency (min): None Contraction Quality: Mild Resting Tone Palpated: Relaxed Resting Time: Adequate  Interpretation (Fetal Testing) Nonstress Test Interpretation: Reactive Comments: Dr. Judeth Cornfield reviewed tracing.

## 2023-10-17 ENCOUNTER — Other Ambulatory Visit: Payer: Self-pay

## 2023-10-17 ENCOUNTER — Ambulatory Visit (INDEPENDENT_AMBULATORY_CARE_PROVIDER_SITE_OTHER): Payer: Medicaid Other | Admitting: Student

## 2023-10-17 ENCOUNTER — Ambulatory Visit: Payer: Medicaid Other | Attending: Obstetrics and Gynecology | Admitting: *Deleted

## 2023-10-17 VITALS — BP 102/59 | HR 59 | Wt 179.4 lb

## 2023-10-17 VITALS — BP 104/51

## 2023-10-17 DIAGNOSIS — O365930 Maternal care for other known or suspected poor fetal growth, third trimester, not applicable or unspecified: Secondary | ICD-10-CM | POA: Diagnosis not present

## 2023-10-17 DIAGNOSIS — O2243 Hemorrhoids in pregnancy, third trimester: Secondary | ICD-10-CM

## 2023-10-17 DIAGNOSIS — Z3A29 29 weeks gestation of pregnancy: Secondary | ICD-10-CM

## 2023-10-17 DIAGNOSIS — O09293 Supervision of pregnancy with other poor reproductive or obstetric history, third trimester: Secondary | ICD-10-CM

## 2023-10-17 DIAGNOSIS — O09299 Supervision of pregnancy with other poor reproductive or obstetric history, unspecified trimester: Secondary | ICD-10-CM

## 2023-10-17 DIAGNOSIS — O365990 Maternal care for other known or suspected poor fetal growth, unspecified trimester, not applicable or unspecified: Secondary | ICD-10-CM

## 2023-10-17 DIAGNOSIS — R7303 Prediabetes: Secondary | ICD-10-CM

## 2023-10-17 DIAGNOSIS — Z3480 Encounter for supervision of other normal pregnancy, unspecified trimester: Secondary | ICD-10-CM

## 2023-10-17 MED ORDER — HYDROCORTISONE (PERIANAL) 2.5 % EX CREA
TOPICAL_CREAM | Freq: Two times a day (BID) | CUTANEOUS | 2 refills | Status: DC
Start: 2023-10-17 — End: 2024-02-01

## 2023-10-17 NOTE — Progress Notes (Signed)
   PRENATAL VISIT NOTE  Subjective:  Madeline Mccormick is a 27 y.o. G4P1021 at [redacted]w[redacted]d being seen today for ongoing prenatal care.  She is currently monitored for the following issues for this high-risk pregnancy and has Iron deficiency anemia; Supervision of other normal pregnancy, antepartum; History of pre-eclampsia in prior pregnancy, currently pregnant; and Fetal growth restriction antepartum on their problem list.  Patient reports backache and lower pelvic pain, hemorrhoids, and swelling in her extremities . Patient states she has noticed the bilateral lower pelvic pain that radiates to her back as "strong pressure". More recently this discomfort woke her up out of her sleep. No dysuria. She has a current hemorrhoid that is causing intermittent discomfort. And mild constipation. Has noticed swelling and tingling in lower extremities and hands bilaterally at the end of the day. Denies headaches, blurred vision, abdominal pain, or malaise.  Contractions: Not present. Vag. Bleeding: None.  Movement: Present. Denies leaking of fluid.   The following portions of the patient's history were reviewed and updated as appropriate: allergies, current medications, past family history, past medical history, past social history, past surgical history and problem list.   Objective:   Vitals:   10/17/23 0951  BP: (!) 102/59  Pulse: (!) 59  Weight: 179 lb 6.4 oz (81.4 kg)    Fetal Status: Fetal Heart Rate (bpm): 130   Movement: Present     General:  Alert, oriented and cooperative. Patient is in no acute distress.  Skin: Skin is warm and dry. No rash noted.   Cardiovascular: Normal heart rate noted  Respiratory: Normal respiratory effort, no problems with respiration noted  Abdomen: Soft, gravid, appropriate for gestational age.  Pain/Pressure: Present     Pelvic: Cervical exam deferred        Extremities: Normal range of motion.  Edema: Trace  Mental Status: Normal mood and affect. Normal behavior. Normal  judgment and thought content.   Assessment and Plan:  Pregnancy: G4P1021 at [redacted]w[redacted]d 1. Supervision of other normal pregnancy, antepartum - Discussed s/s of normal vs. Abnormal pelvic pain and pressure. Encouraged exercises and balance of sitting and short walks. Ebcouraged to use exercise ball to stretch her hips and pelvic floor - Discussed benefits of hydration, exercise, rest, and elevating extremities to decrease swelling.    2. [redacted] weeks gestation of pregnancy - 3rd   trimester labs normal  3. History of pre-eclampsia in prior pregnancy, currently pregnant - ASA therapy - Reviewed s/s of pre-eclampsia   4. Fetal growth restriction antepartum - BPP and growth scan 11/05 - NST today  5. Prediabetes - passed GTT  6. Hemorrhoids during pregnancy in third trimester - Discussed importance of hydration, fiber and OTC options. Recommend Colace 1-2 times a day. Can use Miralax as needed for severe constipation.  - Recommend Tucks pads. - hydrocortisone (ANUSOL-HC) 2.5 % rectal cream; Place rectally 2 (two) times daily.  Dispense: 30 g; Refill: 2   Preterm labor symptoms and general obstetric precautions including but not limited to vaginal bleeding, contractions, leaking of fluid and fetal movement were reviewed in detail with the patient. Please refer to After Visit Summary for other counseling recommendations.   Return in about 2 weeks (around 10/31/2023) for LOB, IN-PERSON.  Future Appointments  Date Time Provider Department Center  10/17/2023 10:45 AM [facility] NST [facility]  10/24/2023  9:30 AM [facility] US1 [facility]  01/04/2024  2:00 PM Arfeen, Phillips Grout, MD [facility] None    Corlis Hove, NP

## 2023-10-17 NOTE — Procedures (Signed)
Madeline Mccormick [birth year] [redacted]w[redacted]d  Fetus A Non-Stress Test Interpretation for 10/17/23 (NST only)  Indication: IUGR  Fetal Heart Rate A Mode: External Baseline Rate (A): 135 bpm Variability: Moderate Accelerations: 10 x 10, 15 x 15 Decelerations: None Multiple birth?: No  Uterine Activity Mode: Palpation, Toco Contraction Frequency (min): Rare Contraction Quality: Mild Resting Tone Palpated: Relaxed Resting Time: Adequate  Interpretation (Fetal Testing) Nonstress Test Interpretation: Reactive Comments: Dr. Parke Poisson reviewed tracing.

## 2023-10-17 NOTE — Patient Instructions (Signed)

## 2023-10-24 ENCOUNTER — Other Ambulatory Visit: Payer: Self-pay

## 2023-10-24 ENCOUNTER — Other Ambulatory Visit: Payer: Self-pay | Admitting: *Deleted

## 2023-10-24 ENCOUNTER — Other Ambulatory Visit: Payer: Self-pay | Admitting: Maternal & Fetal Medicine

## 2023-10-24 ENCOUNTER — Ambulatory Visit: Payer: Medicaid Other | Attending: Maternal & Fetal Medicine

## 2023-10-24 DIAGNOSIS — O365930 Maternal care for other known or suspected poor fetal growth, third trimester, not applicable or unspecified: Secondary | ICD-10-CM

## 2023-10-24 DIAGNOSIS — O35BXX0 Maternal care for other (suspected) fetal abnormality and damage, fetal cardiac anomalies, not applicable or unspecified: Secondary | ICD-10-CM

## 2023-10-24 DIAGNOSIS — O365990 Maternal care for other known or suspected poor fetal growth, unspecified trimester, not applicable or unspecified: Secondary | ICD-10-CM

## 2023-10-24 DIAGNOSIS — O09293 Supervision of pregnancy with other poor reproductive or obstetric history, third trimester: Secondary | ICD-10-CM

## 2023-10-24 DIAGNOSIS — Z3A30 30 weeks gestation of pregnancy: Secondary | ICD-10-CM

## 2023-10-31 ENCOUNTER — Other Ambulatory Visit: Payer: Self-pay

## 2023-10-31 ENCOUNTER — Ambulatory Visit (INDEPENDENT_AMBULATORY_CARE_PROVIDER_SITE_OTHER): Payer: Medicaid Other | Admitting: Family Medicine

## 2023-10-31 ENCOUNTER — Encounter: Payer: Self-pay | Admitting: Family Medicine

## 2023-10-31 VITALS — BP 112/71 | HR 93 | Wt 181.2 lb

## 2023-10-31 DIAGNOSIS — Z3480 Encounter for supervision of other normal pregnancy, unspecified trimester: Secondary | ICD-10-CM

## 2023-10-31 DIAGNOSIS — O365990 Maternal care for other known or suspected poor fetal growth, unspecified trimester, not applicable or unspecified: Secondary | ICD-10-CM

## 2023-10-31 DIAGNOSIS — Z3A31 31 weeks gestation of pregnancy: Secondary | ICD-10-CM

## 2023-10-31 DIAGNOSIS — O365930 Maternal care for other known or suspected poor fetal growth, third trimester, not applicable or unspecified: Secondary | ICD-10-CM

## 2023-10-31 DIAGNOSIS — O09293 Supervision of pregnancy with other poor reproductive or obstetric history, third trimester: Secondary | ICD-10-CM

## 2023-10-31 DIAGNOSIS — O09299 Supervision of pregnancy with other poor reproductive or obstetric history, unspecified trimester: Secondary | ICD-10-CM

## 2023-10-31 NOTE — Progress Notes (Signed)
   Subjective:  Madeline Mccormick is a 27 y.o. G4P1021 at [redacted]w[redacted]d being seen today for ongoing prenatal care.  She is currently monitored for the following issues for this high-risk pregnancy and has Iron deficiency anemia; Supervision of other normal pregnancy, antepartum; History of pre-eclampsia in prior pregnancy, currently pregnant; and Fetal growth restriction antepartum on their problem list.  Patient reports no complaints.  Contractions: Not present. Vag. Bleeding: None.  Movement: Present. Denies leaking of fluid.   The following portions of the patient's history were reviewed and updated as appropriate: allergies, current medications, past family history, past medical history, past social history, past surgical history and problem list. Problem list updated.  Objective:   Vitals:   10/31/23 1122  BP: 112/71  Pulse: 93  Weight: 181 lb 3.2 oz (82.2 kg)    Fetal Status: Fetal Heart Rate (bpm): 140   Movement: Present     General:  Alert, oriented and cooperative. Patient is in no acute distress.  Skin: Skin is warm and dry. No rash noted.   Cardiovascular: Normal heart rate noted  Respiratory: Normal respiratory effort, no problems with respiration noted  Abdomen: Soft, gravid, appropriate for gestational age. Pain/Pressure: Present     Pelvic: Vag. Bleeding: None     Cervical exam deferred        Extremities: Normal range of motion.  Edema: Trace  Mental Status: Normal mood and affect. Normal behavior. Normal judgment and thought content.   Urinalysis:      Assessment and Plan:  Pregnancy: G4P1021 at [redacted]w[redacted]d  1. Supervision of other normal pregnancy, antepartum BP and FHR normal Up to date on labs  2. Fetal growth restriction antepartum Last growth [place] 10/24/23, EFW 8%, AC 15%, normal dopplers Following w MFM  3. History of pre-eclampsia in prior pregnancy, currently pregnant On ASA, normotensive  Preterm labor symptoms and general obstetric precautions including but not  limited to vaginal bleeding, contractions, leaking of fluid and fetal movement were reviewed in detail with the patient. Please refer to After Visit Summary for other counseling recommendations.  Return in 2 weeks (on 11/14/2023) for [facility], ob visit.   Venora Maples, MD

## 2023-10-31 NOTE — Patient Instructions (Signed)

## 2023-11-02 ENCOUNTER — Other Ambulatory Visit: Payer: Self-pay

## 2023-11-02 ENCOUNTER — Ambulatory Visit (HOSPITAL_BASED_OUTPATIENT_CLINIC_OR_DEPARTMENT_OTHER): Payer: Medicaid Other

## 2023-11-02 ENCOUNTER — Ambulatory Visit: Payer: Medicaid Other | Admitting: *Deleted

## 2023-11-02 ENCOUNTER — Ambulatory Visit: Payer: Medicaid Other | Attending: Maternal & Fetal Medicine

## 2023-11-02 VITALS — BP 120/53 | HR 61

## 2023-11-02 DIAGNOSIS — O09293 Supervision of pregnancy with other poor reproductive or obstetric history, third trimester: Secondary | ICD-10-CM | POA: Diagnosis not present

## 2023-11-02 DIAGNOSIS — O365930 Maternal care for other known or suspected poor fetal growth, third trimester, not applicable or unspecified: Secondary | ICD-10-CM | POA: Diagnosis present

## 2023-11-02 DIAGNOSIS — Z3A31 31 weeks gestation of pregnancy: Secondary | ICD-10-CM | POA: Insufficient documentation

## 2023-11-02 DIAGNOSIS — O09299 Supervision of pregnancy with other poor reproductive or obstetric history, unspecified trimester: Secondary | ICD-10-CM | POA: Diagnosis present

## 2023-11-02 DIAGNOSIS — O365990 Maternal care for other known or suspected poor fetal growth, unspecified trimester, not applicable or unspecified: Secondary | ICD-10-CM | POA: Diagnosis present

## 2023-11-02 DIAGNOSIS — D509 Iron deficiency anemia, unspecified: Secondary | ICD-10-CM | POA: Diagnosis present

## 2023-11-02 DIAGNOSIS — O35BXX0 Maternal care for other (suspected) fetal abnormality and damage, fetal cardiac anomalies, not applicable or unspecified: Secondary | ICD-10-CM

## 2023-11-02 DIAGNOSIS — O99019 Anemia complicating pregnancy, unspecified trimester: Secondary | ICD-10-CM | POA: Insufficient documentation

## 2023-11-02 NOTE — Procedures (Signed)
Madeline Mccormick May 17, 1996 [redacted]w[redacted]d  Fetus A Non-Stress Test Interpretation for 11/02/23-NST with UAD  Indication: IUGR  Fetal Heart Rate A Mode: External Baseline Rate (A): 135 bpm Variability: Moderate Accelerations: 15 x 15 Decelerations: None Multiple birth?: No  Uterine Activity Mode: Toco Contraction Frequency (min): none Resting Tone Palpated: Relaxed  Interpretation (Fetal Testing) Nonstress Test Interpretation: Reactive Comments: Tracing reviewed by Dr. Parke Poisson

## 2023-11-07 ENCOUNTER — Ambulatory Visit: Payer: Medicaid Other | Admitting: *Deleted

## 2023-11-07 ENCOUNTER — Ambulatory Visit (HOSPITAL_BASED_OUTPATIENT_CLINIC_OR_DEPARTMENT_OTHER): Payer: Medicaid Other | Admitting: *Deleted

## 2023-11-07 ENCOUNTER — Ambulatory Visit: Payer: Medicaid Other | Attending: Maternal & Fetal Medicine

## 2023-11-07 ENCOUNTER — Encounter: Payer: Self-pay | Admitting: *Deleted

## 2023-11-07 ENCOUNTER — Other Ambulatory Visit: Payer: Self-pay

## 2023-11-07 VITALS — BP 119/64 | HR 71

## 2023-11-07 DIAGNOSIS — Z362 Encounter for other antenatal screening follow-up: Secondary | ICD-10-CM | POA: Insufficient documentation

## 2023-11-07 DIAGNOSIS — Z3A32 32 weeks gestation of pregnancy: Secondary | ICD-10-CM | POA: Diagnosis not present

## 2023-11-07 DIAGNOSIS — O365930 Maternal care for other known or suspected poor fetal growth, third trimester, not applicable or unspecified: Secondary | ICD-10-CM

## 2023-11-07 DIAGNOSIS — Z3480 Encounter for supervision of other normal pregnancy, unspecified trimester: Secondary | ICD-10-CM | POA: Insufficient documentation

## 2023-11-07 DIAGNOSIS — O09293 Supervision of pregnancy with other poor reproductive or obstetric history, third trimester: Secondary | ICD-10-CM | POA: Insufficient documentation

## 2023-11-07 DIAGNOSIS — O35BXX0 Maternal care for other (suspected) fetal abnormality and damage, fetal cardiac anomalies, not applicable or unspecified: Secondary | ICD-10-CM | POA: Diagnosis not present

## 2023-11-07 DIAGNOSIS — O365990 Maternal care for other known or suspected poor fetal growth, unspecified trimester, not applicable or unspecified: Secondary | ICD-10-CM | POA: Diagnosis present

## 2023-11-07 NOTE — Procedures (Signed)
Madeline Mccormick 12-27-1995 [redacted]w[redacted]d  Fetus A Non-Stress Test Interpretation for 11/07/23-NST with UAD  Indication: IUGR  Fetal Heart Rate A Mode: External Baseline Rate (A): 140 bpm Variability: Moderate Accelerations: 15 x 15 Decelerations: None Multiple birth?: No  Uterine Activity Mode: Toco Contraction Frequency (min): one u/c during NST Contraction Duration (sec): 50 Contraction Quality: Mild Resting Tone Palpated: Relaxed  Interpretation (Fetal Testing) Nonstress Test Interpretation: Reactive Comments: Tracing reviewed by Dr. Parke Poisson

## 2023-11-13 ENCOUNTER — Ambulatory Visit: Payer: Medicaid Other | Admitting: Certified Nurse Midwife

## 2023-11-13 ENCOUNTER — Other Ambulatory Visit: Payer: Self-pay

## 2023-11-13 VITALS — BP 109/55 | HR 63 | Wt 184.3 lb

## 2023-11-13 DIAGNOSIS — Z3490 Encounter for supervision of normal pregnancy, unspecified, unspecified trimester: Secondary | ICD-10-CM

## 2023-11-13 DIAGNOSIS — Z3A33 33 weeks gestation of pregnancy: Secondary | ICD-10-CM

## 2023-11-13 DIAGNOSIS — O365930 Maternal care for other known or suspected poor fetal growth, third trimester, not applicable or unspecified: Secondary | ICD-10-CM

## 2023-11-13 DIAGNOSIS — R519 Headache, unspecified: Secondary | ICD-10-CM

## 2023-11-13 DIAGNOSIS — O365990 Maternal care for other known or suspected poor fetal growth, unspecified trimester, not applicable or unspecified: Secondary | ICD-10-CM

## 2023-11-13 DIAGNOSIS — O26893 Other specified pregnancy related conditions, third trimester: Secondary | ICD-10-CM

## 2023-11-13 DIAGNOSIS — O09299 Supervision of pregnancy with other poor reproductive or obstetric history, unspecified trimester: Secondary | ICD-10-CM

## 2023-11-13 NOTE — Progress Notes (Signed)
   PRENATAL VISIT NOTE  Subjective:  Madeline Mccormick is a 27 y.o. G4P1021 at [redacted]w[redacted]d being seen today for ongoing prenatal care.  She is currently monitored for the following issues for this low-risk pregnancy and has Iron deficiency anemia; Supervision of other normal pregnancy, antepartum; History of pre-eclampsia in prior pregnancy, currently pregnant; and Fetal growth restriction antepartum on their problem list.  Patient reports headache and hip pain.  Contractions: Not present. Vag. Bleeding: None.  Movement: Present. Denies leaking of fluid.   The following portions of the patient's history were reviewed and updated as appropriate: allergies, current medications, past family history, past medical history, past social history, past surgical history and problem list.   Objective:   Vitals:   11/13/23 1034  BP: (!) 109/55  Pulse: 63  Weight: 184 lb 4.8 oz (83.6 kg)    Fetal Status: Fetal Heart Rate (bpm): 137   Movement: Present     General:  Alert, oriented and cooperative. Patient is in no acute distress.  Skin: Skin is warm and dry. No rash noted.   Cardiovascular: Normal heart rate noted  Respiratory: Normal respiratory effort, no problems with respiration noted  Abdomen: Soft, gravid, appropriate for gestational age.  Pain/Pressure: Present     Pelvic: Cervical exam deferred        Extremities: Normal range of motion.  Edema: Trace  Mental Status: Normal mood and affect. Normal behavior. Normal judgment and thought content.   Assessment and Plan:  Pregnancy: G4P1021 at [redacted]w[redacted]d 1. Encounter for supervision of low-risk pregnancy, antepartum - Doing well, feeling regular and vigorous fetal movement  2. [redacted] weeks gestation of pregnancy - Routine antepartum care - Counseled on upcoming care - Chiropractic care for hip pain.   3. Fetal growth restriction, antepartum - Last growth [place] 10/24/23, EFW 8%, AC 15%, normal dopplers - MFM follow up 11/13/23  4. History of pre-eclampsia  in prior pregnancy - On ASA   5. Headache in pregnancy, antepartum - Counseled on safe medications in pregnancy - Magnesium and B12 management counseled, handout provided  Preterm labor symptoms and general obstetric precautions including but not limited to vaginal bleeding, contractions, leaking of fluid and fetal movement were reviewed in detail with the patient. Please refer to After Visit Summary for other counseling recommendations.   Return in about 2 weeks (around 11/27/2023) for LOB with GBS.  Future Appointments  Date Time Provider Department Center  11/14/2023 12:15 PM [facility] NURSE [facility]  11/14/2023 12:30 PM [facility] US6 [facility]  11/28/2023  8:35 AM Celedonio Savage, MD [facility]  01/04/2024  2:00 PM Arfeen, Phillips Grout, MD [facility] None    Richardson Landry, CNM

## 2023-11-13 NOTE — Patient Instructions (Addendum)
Safe Medications in Pregnancy   Acne:  Benzoyl Peroxide  Salicylic Acid   Backache/Headache:  Tylenol: 2 regular strength every 4 hours OR               2 Extra strength every 6 hours   Colds/Coughs/Allergies:  Benadryl (alcohol free) 25 mg every 6 hours as needed  Breath right strips  Claritin  Cepacol throat lozenges  Chloraseptic throat spray  Cold-Eeze- up to three times per day  Cough drops, alcohol free  Flonase (by prescription only)  Guaifenesin  Mucinex  Robitussin DM (plain only, alcohol free)  Saline nasal spray/drops  Sudafed (pseudoephedrine) & Actifed * use only after [redacted] weeks gestation and if you do not have high blood pressure  Tylenol  Vicks Vaporub  Zinc lozenges  Zyrtec   Constipation:  Colace  Ducolax suppositories  Fleet enema  Glycerin suppositories  Metamucil  Milk of magnesia  Miralax  Senokot  Smooth move tea   Diarrhea:  Kaopectate  Imodium A-D   *NO pepto Bismol   Hemorrhoids:  Anusol  Anusol HC  Preparation H  Tucks   Indigestion:  Tums  Maalox  Mylanta  Zantac  Pepcid   Insomnia:  Benadryl (alcohol free) 25mg  every 6 hours as needed  Tylenol PM  Unisom, no Gelcaps   Leg Cramps:  Tums  MagGel   Nausea/Vomiting:  Bonine  Dramamine  Emetrol  Ginger extract  Sea bands  Meclizine  Nausea medication to take during pregnancy:  Unisom (doxylamine succinate 25 mg tablets) Take one tablet daily at bedtime. If symptoms are not adequately controlled, the dose can be increased to a maximum recommended dose of two tablets daily (1/2 tablet in the morning, 1/2 tablet mid-afternoon and one at bedtime).  Vitamin B6 100mg  tablets. Take one tablet twice a day (up to 200 mg per day).   Skin Rashes:  Aveeno products  Benadryl cream or 25mg  every 6 hours as needed  Calamine Lotion  1% cortisone cream   Yeast infection:  Gyne-lotrimin 7  Monistat 7    **If taking multiple medications, please check labels to avoid  duplicating the same active ingredients  **take medication as directed on the label  ** Do not exceed 4000 mg of tylenol in 24 hours  **Do not take medications that contain aspirin or ibuprofen          Daron Offer w/ [facility] [address] [place], [place] [address] [phone] [email or web address]  These supplements and herbs are available over the counter without a prescription. They are often in the vitamin section of a pharmacy.   For pregnancy Blood Builder Magnesium - get Calm drink or gummies __________________________________________________________________________________________  To help ripen your Cervix/prepare the uterus (to get your cervix ready for labor):   Red Raspberry Leaf capsules:  two 300mg  or 400mg  tablets with each meal, 2-3 times a day  Potential Side Effects Of Raspberry Leaf:  Most women do not experience any side effects from drinking raspberry leaf tea. However, nausea and loose stools are possible. This can cause uterine irritability. If you notice having many braxton hicks contractions, stop this supplement.    Evening Primrose Oil capsules: may take 1 to 3 capsules daily. May also prick one to release the oil and insert it into your vagina at night.  One regimen would be to take 1 tablets three times per day and place 2 tablets in the vagina at night.   Some  of the potential side effects:  Upset stomach  Loose stools or diarrhea  Headaches  Nausea   _____________________________________________________________________________________________  For Labor: All of these can be use in the last trimester of pregnancy  5-6 Dates a day -- This can help shorten your labor (may taste better if warmed in microwave until soft). This can also be combined with almond butter, any other nut butter, or wrap in bacon and bake, or toss in smoothies. Can also eat [place] bars.  Found where raisins are in the grocery store   ______________________________________________________________________________________________  For Breastfeeding:   NOTHING WILL WORK UNLESS YOU ARE  - Emptying the breast adequately/completely - Emptying the breast regularly  Supplement/Herb Purpose Dose Side Effects  Vitamin D Increase Vitamin D to infant, recommended for all breastfeeding moms and can use instead of supplementing infant 4000 IU daily None  Fenugreek** use with extreme caution as this can decrease milk supply in some especially those with thyroid disorders  Increases prolactin  400mg  TID (max) Nausea, Loose stools and smelling like maple syrup  Goat's Rue Help increase differentiation of breast tissue, good for women with suspected IGT. Helps with insulin sensitvity 1 capsule BID Nausea, loose stools  Legendairy Milk Supplements -PumpPrincess -Liquid Gold -Cash Cow -Milkapalooza  Combination of herbal galactogues  Per packaging Per packaging  Lactation cookies/bars Food based galactogues/increase maternal calories All the time, q2-3 hours    Flax seeds Food based Galactogue Daily GI upset, nausea  Brewer's yeast Food based Galactogue Daily GI upset, nausea  Hemp Hearts Food based Galactogue Daily GI upset, nausea  Oats Food based Galactogue Daily   Lecithin (Soy or Sunflower) Decreases the viscosity of milk, galactogue, fat emulsifier  Good for oversupply mothers to prevent clogged ducts 1200mg  three times per day GI upset  Rehydration drinks (Gatorade/Powerade) Improves maternal hydration and mammary glands are histologically similar to sweat glands  None, caution use in patients with T2DM    For prevention of migraines in pregnancy: -Magnesium, 400mg  by mouth, once daily -Vitamin B2, 400mg  by mouth, once daily  For treatment of migraines in pregnancy: -take medication at the first sign of the pain of a headache, or the first sign of your aura -start with  1000mg  Tylenol (do not exceed 4000mg  of Tylenol in 24hrs), with or without Reglan 10mg  -if no relief after 1-2hours, can take Flexeril 10mg  -if headache is severe and not relieved by the above, may take Fioricet, 1 tablet, no more than 3 days per month -Fioricet should only be used as a rescue medication, when absolutely necessary -if the above regimen does not resolve your headache at all, please come to [facility] for additional treatment -if you take Fioricet, please be aware that this has Tylenol in it and will contribute to the 4,000mg  of Tylenol that you are allowed to take per day

## 2023-11-14 ENCOUNTER — Ambulatory Visit: Payer: Medicaid Other | Admitting: *Deleted

## 2023-11-14 ENCOUNTER — Other Ambulatory Visit: Payer: Self-pay

## 2023-11-14 ENCOUNTER — Ambulatory Visit: Payer: Medicaid Other | Attending: Maternal & Fetal Medicine

## 2023-11-14 ENCOUNTER — Other Ambulatory Visit: Payer: Self-pay | Admitting: *Deleted

## 2023-11-14 VITALS — BP 111/60 | HR 63

## 2023-11-14 DIAGNOSIS — Z3A33 33 weeks gestation of pregnancy: Secondary | ICD-10-CM | POA: Insufficient documentation

## 2023-11-14 DIAGNOSIS — Z3480 Encounter for supervision of other normal pregnancy, unspecified trimester: Secondary | ICD-10-CM | POA: Insufficient documentation

## 2023-11-14 DIAGNOSIS — O35BXX0 Maternal care for other (suspected) fetal abnormality and damage, fetal cardiac anomalies, not applicable or unspecified: Secondary | ICD-10-CM

## 2023-11-14 DIAGNOSIS — O358XX0 Maternal care for other (suspected) fetal abnormality and damage, not applicable or unspecified: Secondary | ICD-10-CM | POA: Insufficient documentation

## 2023-11-14 DIAGNOSIS — O365930 Maternal care for other known or suspected poor fetal growth, third trimester, not applicable or unspecified: Secondary | ICD-10-CM | POA: Insufficient documentation

## 2023-11-14 DIAGNOSIS — O365990 Maternal care for other known or suspected poor fetal growth, unspecified trimester, not applicable or unspecified: Secondary | ICD-10-CM | POA: Insufficient documentation

## 2023-11-14 DIAGNOSIS — O09293 Supervision of pregnancy with other poor reproductive or obstetric history, third trimester: Secondary | ICD-10-CM | POA: Diagnosis not present

## 2023-11-14 DIAGNOSIS — Z362 Encounter for other antenatal screening follow-up: Secondary | ICD-10-CM | POA: Insufficient documentation

## 2023-11-21 ENCOUNTER — Other Ambulatory Visit: Payer: Self-pay

## 2023-11-21 ENCOUNTER — Ambulatory Visit: Payer: Medicaid Other | Attending: Obstetrics | Admitting: *Deleted

## 2023-11-21 VITALS — BP 103/58

## 2023-11-21 DIAGNOSIS — O365930 Maternal care for other known or suspected poor fetal growth, third trimester, not applicable or unspecified: Secondary | ICD-10-CM

## 2023-11-21 DIAGNOSIS — Z3A34 34 weeks gestation of pregnancy: Secondary | ICD-10-CM | POA: Diagnosis not present

## 2023-11-21 DIAGNOSIS — O365990 Maternal care for other known or suspected poor fetal growth, unspecified trimester, not applicable or unspecified: Secondary | ICD-10-CM | POA: Insufficient documentation

## 2023-11-21 NOTE — Procedures (Signed)
Madeline Mccormick Jul 05, 1996 [redacted]w[redacted]d  Fetus A Non-Stress Test Interpretation for 11/21/23  Indication: IUGR  Fetal Heart Rate A Mode: External Baseline Rate (A): 140 bpm Variability: Moderate Accelerations: 15 x 15 Decelerations: None Multiple birth?: No  Uterine Activity Mode: Palpation, Toco Contraction Frequency (min): UI Contraction Quality: Mild Resting Tone Palpated: Relaxed Resting Time: Adequate  Interpretation (Fetal Testing) Nonstress Test Interpretation: Reactive Comments: Dr. Judeth Cornfield reviewed tracing.

## 2023-11-28 ENCOUNTER — Ambulatory Visit: Payer: Medicaid Other | Attending: Obstetrics | Admitting: *Deleted

## 2023-11-28 ENCOUNTER — Ambulatory Visit (INDEPENDENT_AMBULATORY_CARE_PROVIDER_SITE_OTHER): Payer: Medicaid Other | Admitting: Family Medicine

## 2023-11-28 ENCOUNTER — Other Ambulatory Visit: Payer: Self-pay

## 2023-11-28 VITALS — BP 123/60

## 2023-11-28 VITALS — BP 102/67 | HR 71 | Wt 185.0 lb

## 2023-11-28 DIAGNOSIS — O0993 Supervision of high risk pregnancy, unspecified, third trimester: Secondary | ICD-10-CM

## 2023-11-28 DIAGNOSIS — Z3A35 35 weeks gestation of pregnancy: Secondary | ICD-10-CM

## 2023-11-28 DIAGNOSIS — O365990 Maternal care for other known or suspected poor fetal growth, unspecified trimester, not applicable or unspecified: Secondary | ICD-10-CM

## 2023-11-28 DIAGNOSIS — O09299 Supervision of pregnancy with other poor reproductive or obstetric history, unspecified trimester: Secondary | ICD-10-CM

## 2023-11-28 DIAGNOSIS — O365930 Maternal care for other known or suspected poor fetal growth, third trimester, not applicable or unspecified: Secondary | ICD-10-CM

## 2023-11-28 DIAGNOSIS — Z3490 Encounter for supervision of normal pregnancy, unspecified, unspecified trimester: Secondary | ICD-10-CM

## 2023-11-28 DIAGNOSIS — O09293 Supervision of pregnancy with other poor reproductive or obstetric history, third trimester: Secondary | ICD-10-CM

## 2023-11-28 DIAGNOSIS — G5603 Carpal tunnel syndrome, bilateral upper limbs: Secondary | ICD-10-CM

## 2023-11-28 NOTE — Patient Instructions (Signed)
It was good seeing you today!  For your carpal tunnel symptoms I recommend getting some wrist braces and you can wear these at night.  I have attached some information on carpal tunnel syndrome.  Let me know if you need anything.  Have a great day!

## 2023-11-28 NOTE — Progress Notes (Signed)
   PRENATAL VISIT NOTE  Subjective:  Madeline Mccormick is a 27 y.o. G4P1021 at [redacted]w[redacted]d being seen today for ongoing prenatal care.  She is currently monitored for the following issues for this high-risk pregnancy and has Iron deficiency anemia; Supervision of other normal pregnancy, antepartum; History of pre-eclampsia in prior pregnancy, currently pregnant; and Fetal growth restriction antepartum on their problem list.  Patient reports no bleeding, no contractions, no cramping, and no leaking.  Contractions: Not present. Vag. Bleeding: None.  Movement: Present. Denies leaking of fluid.   The following portions of the patient's history were reviewed and updated as appropriate: allergies, current medications, past family history, past medical history, past social history, past surgical history and problem list.   Objective:   Vitals:   11/28/23 0842  BP: 102/67  Pulse: 71  Weight: 185 lb (83.9 kg)    Fetal Status: Fetal Heart Rate (bpm): 138   Movement: Present     General:  Alert, oriented and cooperative. Patient is in no acute distress.  Skin: Skin is warm and dry. No rash noted.   Cardiovascular: Normal heart rate noted  Respiratory: Normal respiratory effort, no problems with respiration noted  Abdomen: Soft, gravid, appropriate for gestational age.  Pain/Pressure: Present     Pelvic: Cervical exam deferred        Extremities: Normal range of motion.  Edema: Trace  Mental Status: Normal mood and affect. Normal behavior. Normal judgment and thought content.   Assessment and Plan:  Pregnancy: G4P1021 at [redacted]w[redacted]d 1. Encounter for supervision of low-risk pregnancy, antepartum FHR and BP appropriate today, no concerns at this time.  2. History of pre-eclampsia in prior pregnancy, currently pregnant Blood pressure appropriate today.  Patient will continue to monitor.  3. Fetal growth restriction antepartum Most recent scan showing 10th percentile Continue monitoring per MFM  4. [redacted] weeks  gestation of pregnancy  5. Bilateral carpal tunnel syndrome Symptoms consistent with carpal tunnel.  Discussed wrist braces and OTC meds.  Could consider gabapentin or injections if symptoms worsen or do not improve  Preterm labor symptoms and general obstetric precautions including but not limited to vaginal bleeding, contractions, leaking of fluid and fetal movement were reviewed in detail with the patient. Please refer to After Visit Summary for other counseling recommendations.   No follow-ups on file.  Future Appointments  Date Time Provider Department Center  11/28/2023  9:45 AM [facility]  12/08/2023  7:30 AM [facility] US5 [facility]  01/04/2024  2:00 PM Arfeen, Phillips Grout, MD [facility] None    Celedonio Savage, MD

## 2023-11-28 NOTE — Procedures (Signed)
Madeline Mccormick 1996/12/19 [redacted]w[redacted]d  Fetus A Non-Stress Test Interpretation for 11/28/23 (NST only)  Indication: IUGR  Fetal Heart Rate A Mode: External Baseline Rate (A): 140 bpm Variability: Moderate Accelerations: 15 x 15 Decelerations: None Multiple birth?: No  Uterine Activity Mode: Palpation, Toco Contraction Frequency (min): UI Contraction Quality: Mild Resting Tone Palpated: Relaxed Resting Time: Adequate  Interpretation (Fetal Testing) Nonstress Test Interpretation: Reactive Comments: Dr. Grace Bushy reviewed tracing.

## 2023-12-08 ENCOUNTER — Other Ambulatory Visit: Payer: Self-pay

## 2023-12-08 ENCOUNTER — Ambulatory Visit: Payer: Medicaid Other | Attending: Obstetrics

## 2023-12-08 VITALS — BP 105/58 | HR 58

## 2023-12-08 DIAGNOSIS — Z3A36 36 weeks gestation of pregnancy: Secondary | ICD-10-CM

## 2023-12-08 DIAGNOSIS — O09293 Supervision of pregnancy with other poor reproductive or obstetric history, third trimester: Secondary | ICD-10-CM

## 2023-12-08 DIAGNOSIS — O365990 Maternal care for other known or suspected poor fetal growth, unspecified trimester, not applicable or unspecified: Secondary | ICD-10-CM | POA: Diagnosis present

## 2023-12-08 DIAGNOSIS — O365930 Maternal care for other known or suspected poor fetal growth, third trimester, not applicable or unspecified: Secondary | ICD-10-CM | POA: Diagnosis not present

## 2023-12-08 DIAGNOSIS — Z3480 Encounter for supervision of other normal pregnancy, unspecified trimester: Secondary | ICD-10-CM | POA: Diagnosis present

## 2023-12-08 DIAGNOSIS — O35BXX0 Maternal care for other (suspected) fetal abnormality and damage, fetal cardiac anomalies, not applicable or unspecified: Secondary | ICD-10-CM

## 2023-12-11 ENCOUNTER — Other Ambulatory Visit (HOSPITAL_COMMUNITY)
Admission: RE | Admit: 2023-12-11 | Discharge: 2023-12-11 | Disposition: A | Discharge status: Home / Self Care | Payer: Medicaid Other | Source: Ambulatory Visit | Attending: Obstetrics & Gynecology | Admitting: Obstetrics & Gynecology

## 2023-12-11 ENCOUNTER — Ambulatory Visit: Payer: Medicaid Other | Admitting: Obstetrics & Gynecology

## 2023-12-11 ENCOUNTER — Other Ambulatory Visit: Payer: Self-pay

## 2023-12-11 VITALS — BP 116/68 | HR 73 | Wt 189.9 lb

## 2023-12-11 DIAGNOSIS — Z3480 Encounter for supervision of other normal pregnancy, unspecified trimester: Secondary | ICD-10-CM | POA: Diagnosis present

## 2023-12-11 DIAGNOSIS — O09293 Supervision of pregnancy with other poor reproductive or obstetric history, third trimester: Secondary | ICD-10-CM

## 2023-12-11 DIAGNOSIS — Z3A37 37 weeks gestation of pregnancy: Secondary | ICD-10-CM

## 2023-12-11 DIAGNOSIS — O368390 Maternal care for abnormalities of the fetal heart rate or rhythm, unspecified trimester, not applicable or unspecified: Secondary | ICD-10-CM

## 2023-12-11 DIAGNOSIS — O09299 Supervision of pregnancy with other poor reproductive or obstetric history, unspecified trimester: Secondary | ICD-10-CM

## 2023-12-11 NOTE — Progress Notes (Deleted)
   PRENATAL VISIT NOTE  Subjective:  Madeline Mccormick is a 27 y.o. G4P1021 at [redacted]w[redacted]d being seen today for ongoing prenatal care.  She is currently monitored for the following issues for this {Blank single:19197::"high-risk","low-risk"} pregnancy and has Iron deficiency anemia; Supervision of other normal pregnancy, antepartum; and History of pre-eclampsia in prior pregnancy, currently pregnant on their problem list.  Patient reports {sx:[id number]}.  Contractions: Not present. Vag. Bleeding: None.  Movement: Present. Denies leaking of fluid.   The following portions of the patient's history were reviewed and updated as appropriate: allergies, current medications, past family history, past medical history, past social history, past surgical history and problem list.   Objective:   Vitals:   12/11/23 1053  BP: 116/68  Pulse: 73  Weight: 189 lb 14.4 oz (86.1 kg)    Fetal Status:     Movement: Present     General:  Alert, oriented and cooperative. Patient is in no acute distress.  Skin: Skin is warm and dry. No rash noted.   Cardiovascular: Normal heart rate noted  Respiratory: Normal respiratory effort, no problems with respiration noted  Abdomen: Soft, gravid, appropriate for gestational age.  Pain/Pressure: Present (lower pressure)     Pelvic: {Blank single:19197::"Cervical exam performed in the presence of a chaperone","Cervical exam deferred"}        Extremities: Normal range of motion.  Edema: Trace  Mental Status: Normal mood and affect. Normal behavior. Normal judgment and thought content.   Assessment and Plan:  Pregnancy: G4P1021 at [redacted]w[redacted]d 1. Low baseline fetal heart rate (Primary) *** - Fetal nonstress test  2. History of pre-eclampsia in prior pregnancy, currently pregnant ***  3. [redacted] weeks gestation of pregnancy *** - Cervicovaginal ancillary only - Culture, beta strep (group b only)  4. Supervision of other normal pregnancy, antepartum *** - Cervicovaginal ancillary  only - Culture, beta strep (group b only)  {Blank single:19197::"Term","Preterm"} labor symptoms and general obstetric precautions including but not limited to vaginal bleeding, contractions, leaking of fluid and fetal movement were reviewed in detail with the patient. Please refer to After Visit Summary for other counseling recommendations.   No follow-ups on file.  Future Appointments  Date Time Provider Department Center  12/25/2023  8:15 AM Warden Fillers, MD [facility] [place]  01/04/2024  2:00 PM Arfeen, Phillips Grout, MD [facility] None    Jaynie Collins, MD

## 2023-12-11 NOTE — Patient Instructions (Signed)

## 2023-12-11 NOTE — Progress Notes (Signed)
PRENATAL VISIT NOTE  Subjective:  Madeline Mccormick is a 27 y.o. G4P1021 at [redacted]w[redacted]d being seen today for ongoing prenatal care.  She is currently monitored for the following issues for this low-risk pregnancy and has Iron deficiency anemia; Supervision of other normal pregnancy, antepartum; and History of pre-eclampsia in prior pregnancy, currently pregnant on their problem list.  Patient reports no complaints.  Contractions: Not present. Vag. Bleeding: None.  Movement: Present. Denies leaking of fluid.   The following portions of the patient's history were reviewed and updated as appropriate: allergies, current medications, past family history, past medical history, past social history, past surgical history and problem list.   Objective:   Vitals:   12/11/23 1053  BP: 116/68  Pulse: 73  Weight: 189 lb 14.4 oz (86.1 kg)    Fetal Status: Fetal Heart Rate (bpm): 108   Movement: Present     General:  Alert, oriented and cooperative. Patient is in no acute distress.  Skin: Skin is warm and dry. No rash noted.   Cardiovascular: Normal heart rate noted  Respiratory: Normal respiratory effort, no problems with respiration noted  Abdomen: Soft, gravid, appropriate for gestational age.  Pain/Pressure: Present (lower pressure)     Pelvic: Cervical exam deferred        Extremities: Normal range of motion.  Edema: Trace  Mental Status: Normal mood and affect. Normal behavior. Normal judgment and thought content.   Imaging: [place] MFM OB FOLLOW UP Result Date: 12/08/2023 ----------------------------------------------------------------------  OBSTETRICS REPORT                       (Signed Final 12/08/2023 08:26 am) ---------------------------------------------------------------------- Patient Info  ID #:       [id number]                          D.O.B.:  1996/10/03 (27 yrs)(F)  Name:       Madeline Mccormick                    Visit Date: 12/08/2023 07:38 am  ---------------------------------------------------------------------- Performed By  Attending:        Ma Rings MD         Ref. Address:     [address]                                                             [place], [place]                                                             [address]  Performed By:     Anabel Halon          [facility]  Referred By:      [facility] ---------------------------------------------------------------------- Orders  #  Description                           Code        Ordered By  1  [place] MFM OB FOLLOW UP                   [phone]    YU FANG  2  [place] MFM FETAL BPP WO NON               [id number]    YU FANG     STRESS ----------------------------------------------------------------------  #  Order #                     Accession #                Episode #  1  [id number]                   [id number]                 [id number]  2  [id number]                   [id number]                 [id number] ---------------------------------------------------------------------- Indications  Poor fetal growth, third trimester             O36.5930  Poor obstetric history: Previous preeclampsia  O09.299  Echogenic intracardiac focus of the heart      O35.8XX0  (EIF)  [redacted] weeks gestation of pregnancy                Z3A.36  Encounter for other antenatal screening        Z36.2  follow-up ---------------------------------------------------------------------- Fetal Evaluation  Num Of Fetuses:         1  Fetal Heart Rate(bpm):  148  Cardiac Activity:       Observed  Presentation:           Cephalic  Placenta:               Anterior  P. Cord Insertion:      Previously seen  Amniotic Fluid  AFI FV:      Within normal limits  AFI Sum(cm)      %Tile       Largest Pocket(cm)  16.84           63          8.32  RUQ(cm)       RLQ(cm)       LUQ(cm)        LLQ(cm)  8.32          4.97          2.01           1.54 ---------------------------------------------------------------------- Biophysical Evaluation  Amniotic F.V:   Pocket => 2 cm             F. Tone:        Observed  F. Movement:    Observed                   Score:          8/8  F. Breathing:   Observed ---------------------------------------------------------------------- Biometry  BPD:      95.5  mm     G. Age:  39w 0d         98  %    CI:        82.94   %    70 - 86                                                          FL/HC:      18.9   %    20.8 - 22.6  HC:      330.7  mm     G. Age:  37w 5d         48  %    HC/AC:      1.03        0.92 - 1.05  AC:      321.6  mm     G. Age:  36w 1d         48  %    FL/BPD:     65.4   %    71 - 87  FL:       62.5  mm     G. Age:  32w 3d        < 1  %    FL/AC:      19.4   %    20 - 24  Est. FW:    2715  gm           6 lb     28  % ---------------------------------------------------------------------- OB History  Gravidity:    4         Term:   1        Prem:   0        SAB:   1  TOP:          1       Ectopic:  0        Living: 1 ---------------------------------------------------------------------- Gestational Age  LMP:           60w 4d        Date:  10/10/22                 EDD:   07/17/23  U/S Today:     36w 2d  EDD:   01/03/24  Best:          36w 4d     Det. ByMarcella Dubs         EDD:   01/01/24                                      (05/09/23) ---------------------------------------------------------------------- Targeted Anatomy  Central Nervous System  Calvarium/Cranial V.:  Appears normal         Cereb./Vermis:          Previously seen  Cavum:                 Previously seen        [place]:         Previously seen  Lateral Ventricles:    Previously seen        Midline Falx:           Previously seen  Choroid  Plexus:        Previously seen  Spine  Cervical:              Previously seen        Sacral:                 Previously seen  Thoracic:              Previously seen        Shape/Curvature:        Previously seen  Lumbar:                Previously seen  Head/Neck  Lips:                  Previously seen        Profile:                Previously seen  Ear Position/Size:     Previously seen        Orbits/Eyes:            Previously seen  Neck:                  Previously seen        Mandible:               Previously seen  Nuchal Fold:           Previously seen        Maxilla:                Previously seen  Nasal Bone:            Previously seen  Thorax  4 Chamber View:        Previously seen        Interventr. Septum:     Previously seen  Cardiac Activity:      Observed               Cardiac Axis:           Previously een  Cardiac Situs:         Previously seen        Diaphragm:              Appears normal  Rt Outflow Tract:      Previously seen        3 Vessel View:  Previously seen  Lt Outflow Tract:      Previously seen        3 V Trachea View:       Previously seen  Aortic Arch:           Previously seen        IVC:                    Previously seen  Ductal Arch:           Previously seen        Crossing:               Previously seen  SVC:                   Previously seen  Abdomen  Ventral Wall:          Previously seen        Rt Kidney:              Appears normal  Cord Insertion:        Previously seen        Bladder:                Appears normal  Situs:                 Previously seen        Adrenals:               Previously seen  Stomach:               Appears normal         Bowel:                  Previously seen  Lt Kidney:             Appears normal         Spleen:                 Previously seen  Extremities  Lt Humerus:            Previously seen        Lt Femur:               Previously seen  Rt Humerus:            Previously seen        Rt Femur:               Previously seen  Lt Forearm:             Previously seen        Lt Lower Leg:           Previously seen  Rt Forearm:            Previously seen        Rt Lower Leg:           Previously seen  Lt Hand:               Previously seen        Lt Foot:                Previously seen  Rt Hand:               Previously seen        Rt Foot:                Previously seen  Other  Umbilical Cord:        Previously seen  Comment:     Fetal anatomic survey completed previously. ---------------------------------------------------------------------- Cervix Uterus Adnexa  Cervix  Not visualized (advanced GA >24wks) ---------------------------------------------------------------------- Comments  This patient was seen for a follow up exam as IUGR was  noted earlier in her pregnancy.  She denies any problems  since her last exam.  She was informed that the fetal growth and amniotic fluid  level appears appropriate for her gestational age.  A BPP performed today was 8 out of 8.  As the fetus is no longer growth restricted, she will not need  any additional further testing.  She should be delivered by her due date.  No further exams were scheduled in our office. ----------------------------------------------------------------------                   Ma Rings, MD Electronically Signed Final Report   12/08/2023 08:26 am ----------------------------------------------------------------------   [place] MFM FETAL BPP WO NON STRESS Result Date: 12/08/2023 ----------------------------------------------------------------------  OBSTETRICS REPORT                       (Signed Final 12/08/2023 08:26 am) ---------------------------------------------------------------------- Patient Info  ID #:       [id number]                          D.O.B.:  Apr 27, 1996 (27 yrs)(F)  Name:       Madeline Buddenhagen                    Visit Date: 12/08/2023 07:38 am ---------------------------------------------------------------------- Performed By  Attending:        Ma Rings MD         Ref. Address:      [address]                                                             [place], [place]                                                             [address]  Performed By:     Anabel Halon          Location[facility]                                     [facility]  Referred By:      [facility] ---------------------------------------------------------------------- Orders  #  Description                           Code        Ordered By  1  [place] MFM OB FOLLOW UP                   [phone]    Rosana Hoes  2  [place] MFM FETAL BPP WO NON               [id number]    YU FANG     STRESS ----------------------------------------------------------------------  #  Order #                     Accession #                Episode #  1  [id number]                   [id number]                 [id number]  2  [id number]                   [id number]                 [id number] ---------------------------------------------------------------------- Indications  Poor fetal growth, third trimester             O36.5930  Poor obstetric history: Previous preeclampsia  O09.299  Echogenic intracardiac focus of the heart      O35.8XX0  (EIF)  [redacted] weeks gestation of pregnancy                Z3A.36  Encounter for other antenatal screening        Z36.2  follow-up ---------------------------------------------------------------------- Fetal Evaluation  Num Of Fetuses:         1  Fetal Heart Rate(bpm):  148  Cardiac Activity:       Observed  Presentation:           Cephalic  Placenta:               Anterior  P. Cord Insertion:      Previously seen  Amniotic Fluid  AFI FV:      Within normal limits  AFI Sum(cm)     %Tile       Largest Pocket(cm)  16.84           63          8.32  RUQ(cm)       RLQ(cm)       LUQ(cm)        LLQ(cm)  8.32          4.97           2.01           1.54 ---------------------------------------------------------------------- Biophysical Evaluation  Amniotic F.V:   Pocket => 2 cm             F. Tone:        Observed  F. Movement:    Observed                   Score:  8/8  F. Breathing:   Observed ---------------------------------------------------------------------- Biometry  BPD:      95.5  mm     G. Age:  39w 0d         98  %    CI:        82.94   %    70 - 86                                                          FL/HC:      18.9   %    20.8 - 22.6  HC:      330.7  mm     G. Age:  37w 5d         48  %    HC/AC:      1.03        0.92 - 1.05  AC:      321.6  mm     G. Age:  36w 1d         48  %    FL/BPD:     65.4   %    71 - 87  FL:       62.5  mm     G. Age:  32w 3d        < 1  %    FL/AC:      19.4   %    20 - 24  Est. FW:    2715  gm           6 lb     28  % ---------------------------------------------------------------------- OB History  Gravidity:    4         Term:   1        Prem:   0        SAB:   1  TOP:          1       Ectopic:  0        Living: 1 ---------------------------------------------------------------------- Gestational Age  LMP:           60w 4d        Date:  10/10/22                 EDD:   07/17/23  U/S Today:     36w 2d                                        EDD:   01/03/24  Best:          36w 4d     Det. ByMarcella Dubs         EDD:   01/01/24                                      (05/09/23) ---------------------------------------------------------------------- Targeted Anatomy  Central Nervous System  Calvarium/Cranial V.:  Appears normal         Cereb./Vermis:          Previously seen  Cavum:  Previously seen        [place] [employer] [place] [employer]:         Previously seen  Lateral Ventricles:    Previously seen        Midline Falx:           Previously seen  Choroid Plexus:        Previously seen  Spine  Cervical:              Previously seen        Sacral:                 Previously seen  Thoracic:               Previously seen        Shape/Curvature:        Previously seen  Lumbar:                Previously seen  Head/Neck  Lips:                  Previously seen        Profile:                Previously seen  Ear Position/Size:     Previously seen        Orbits/Eyes:            Previously seen  Neck:                  Previously seen        Mandible:               Previously seen  Nuchal Fold:           Previously seen        Maxilla:                Previously seen  Nasal Bone:            Previously seen  Thorax  4 Chamber View:        Previously seen        Interventr. Septum:     Previously seen  Cardiac Activity:      Observed               Cardiac Axis:           Previously een  Cardiac Situs:         Previously seen        Diaphragm:              Appears normal  Rt Outflow Tract:      Previously seen        3 Vessel View:          Previously seen  Lt Outflow Tract:      Previously seen        3 V Trachea View:       Previously seen  Aortic Arch:           Previously seen        IVC:                    Previously seen  Ductal Arch:           Previously seen        Crossing:               Previously seen  SVC:  Previously seen  Abdomen  Ventral Wall:          Previously seen        Rt Kidney:              Appears normal  Cord Insertion:        Previously seen        Bladder:                Appears normal  Situs:                 Previously seen        Adrenals:               Previously seen  Stomach:               Appears normal         Bowel:                  Previously seen  Lt Kidney:             Appears normal         Spleen:                 Previously seen  Extremities  Lt Humerus:            Previously seen        Lt Femur:               Previously seen  Rt Humerus:            Previously seen        Rt Femur:               Previously seen  Lt Forearm:            Previously seen        Lt Lower Leg:           Previously seen  Rt Forearm:            Previously seen        Rt Lower Leg:            Previously seen  Lt Hand:               Previously seen        Lt Foot:                Previously seen  Rt Hand:               Previously seen        Rt Foot:                Previously seen  Other  Umbilical Cord:        Previously seen  Comment:     Fetal anatomic survey completed previously. ---------------------------------------------------------------------- Cervix Uterus Adnexa  Cervix  Not visualized (advanced GA >24wks) ---------------------------------------------------------------------- Comments  This patient was seen for a follow up exam as IUGR was  noted earlier in her pregnancy.  She denies any problems  since her last exam.  She was informed that the fetal growth and amniotic fluid  level appears appropriate for her gestational age.  A BPP performed today was 8 out of 8.  As the fetus is no longer growth restricted, she will not need  any additional further testing.  She should be delivered by her due date.  No further exams were scheduled in our office. ----------------------------------------------------------------------  Ma Rings, MD Electronically Signed Final Report   12/08/2023 08:26 am ----------------------------------------------------------------------   [place] MFM FETAL BPP WO NON STRESS Result Date: 11/14/2023 ----------------------------------------------------------------------  OBSTETRICS REPORT                       (Signed Final 11/14/2023 02:25 pm) ---------------------------------------------------------------------- Patient Info  ID #:       [id number]                          D.O.B.:  1996-12-16 (27 yrs)(F)  Name:       Madeline Mccormick                    Visit Date: 11/14/2023 12:17 pm ---------------------------------------------------------------------- Performed By  Attending:        Ma Rings MD         Ref. Address:     [address]                                                             [place], [place]                                                              [address]  Performed By:     Dennis Bast RDMS      [facility]  Referred By:      [facility] ---------------------------------------------------------------------- Orders  #  Description                           Code        Ordered By  1  [place] MFM FETAL BPP WO NON               [phone]  BURK SCHAIBLE     STRESS  2  [place] MFM OB FOLLOW UP                   [id number]    BURK SCHAIBLE  3  [place] MFM UA CORD DOPPLER                76820.02    [facility] ----------------------------------------------------------------------  #  Order #                     Accession #                Episode #  1  [id number]                   [id number]                 [id number]  2  [id number]                   [id number]                 [id number]  3  [id number]                   [id number]                 [id number] ---------------------------------------------------------------------- Indications  Poor fetal growth, third trimester             O36.5930  Poor obstetric history: Previous preeclampsia  O09.299  Echogenic intracardiac focus of the heart      O35.8XX0  (EIF)  Encounter for other antenatal screening        Z36.2  follow-up  [redacted] weeks gestation of pregnancy                Z3A.33 ---------------------------------------------------------------------- Vital Signs  BP:          111/60 ---------------------------------------------------------------------- Fetal Evaluation  Num Of Fetuses:         1  Fetal Heart Rate(bpm):  135  Cardiac Activity:       Observed  Presentation:           Cephalic  Placenta:               Anterior  P. Cord Insertion:      Previously seen  Amniotic Fluid  AFI FV:      Within normal limits  AFI Sum(cm)     %Tile       Largest Pocket(cm)   17.49           64          5.61  RUQ(cm)       RLQ(cm)       LUQ(cm)        LLQ(cm)  5.61          3.9           4.5            3.48 ---------------------------------------------------------------------- Biophysical Evaluation  Amniotic F.V:   Within normal limits       F. Tone:        Observed  F. Movement:    Observed                   Score:          8/8  F. Breathing:   Observed ---------------------------------------------------------------------- Biometry  BPD:      86.3  mm  G. Age:  34w 6d         87  %    CI:        81.32   %    70 - 86                                                          FL/HC:      19.2   %    19.9 - 21.5  HC:      302.1  mm     G. Age:  33w 4d         23  %    HC/AC:      1.09        0.96 - 1.11  AC:      278.1  mm     G. Age:  31w 6d         17  %    FL/BPD:     67.1   %    71 - 87  FL:       57.9  mm     G. Age:  30w 2d        < 1  %    FL/AC:      20.8   %    20 - 24  LV:        4.1  mm  Est. FW:    1838  gm      4 lb 1 oz     10  % ---------------------------------------------------------------------- OB History  Gravidity:    4         Term:   1        Prem:   0        SAB:   1  TOP:          1       Ectopic:  0        Living: 1 ---------------------------------------------------------------------- Gestational Age  LMP:           57w 1d        Date:  10/10/22                 EDD:   07/17/23  U/S Today:     32w 5d                                        EDD:   01/04/24  Best:          33w 1d     Det. ByMarcella Dubs         EDD:   01/01/24                                      (05/09/23) ---------------------------------------------------------------------- Anatomy  Cranium:               Previously seen        Aortic Arch:            Previously seen  Cavum:                 Previously  seen        Ductal Arch:            Previously seen  Ventricles:            Appears normal         Diaphragm:              Appears normal  Choroid Plexus:        Previously seen        Stomach:                 Appears normal, left                                                                        sided  Cerebellum:            Previously seen        Abdomen:                Previously seen  Posterior Fossa:       Previously seen        Abdominal Wall:         Previously seen  Face:                  Orbits and profile     Cord Vessels:           Previously seen                         previously seen  Lips:                  Previously seen        Kidneys:                Appear normal  Thoracic:              Previously seen        Bladder:                Appears normal  Heart:                 Appears normal         Spine:                  Previously seen                         (4CH, axis, and                         situs)  RVOT:                  Previously seen        Upper Extremities:      Previously seen  LVOT:                  Previously seen        Lower Extremities:      Previously seen ---------------------------------------------------------------------- Doppler - Fetal Vessels  Umbilical Artery   S/D     %tile      RI    %tile  PSV    ADFV    RDFV                                                     (cm/s)   3.29       84     0.7       87                     45.72      No      No ---------------------------------------------------------------------- Cervix Uterus Adnexa  Cervix  Not visualized (advanced GA >24wks)  Uterus  No abnormality visualized.  Right Ovary  Not visualized.  Left Ovary  Not visualized.  Cul De Sac  No free fluid seen.  Adnexa  No abnormality visualized ---------------------------------------------------------------------- Comments  This patient was seen due to IUGR.  She denies any  problems since her last exam and reports feeling vigorous  fetal movements throughout the day.  On today's exam, the EFW of 4 pounds 1 ounces measures  at the 10th  percentile for her gestational age indicating  borderline IUGR.  The total AFI was 17.49 cm (within normal  limits).  A BPP performed today was 8 out of 8.  Doppler studies of the umbilical arteries showed a normal  S/D ratio of 3.29 .  There were no signs of absent or reversed  end-diastolic flow.  Due to borderline IUGR, she should continue weekly NSTs in  your office for the next 2 weeks.  She will return to our office in 3 weeks for another growth  scan and BPP. ----------------------------------------------------------------------                   Ma Rings, MD Electronically Signed Final Report   11/14/2023 02:25 pm ----------------------------------------------------------------------   [place] MFM OB FOLLOW UP Result Date: 11/14/2023 ----------------------------------------------------------------------  OBSTETRICS REPORT                       (Signed Final 11/14/2023 02:25 pm) ---------------------------------------------------------------------- Patient Info  ID #:       [id number]                          D.O.B.:  1996-02-09 (27 yrs)(F)  Name:       Madeline Mccormick                    Visit Date: 11/14/2023 12:17 pm ---------------------------------------------------------------------- Performed By  Attending:        Ma Rings MD         Ref. Address:     [address]                                                             [place], [place]  [address]  Performed By:     Dennis Bast RDMS      [facility]  Referred By:      [facility] ---------------------------------------------------------------------- Orders  #  Description                           Code        Ordered By  1  [place] MFM FETAL BPP WO NON               76819.01    BURK SCHAIBLE     STRESS  2  [place] MFM OB FOLLOW UP                    76816.01    BURK SCHAIBLE  3  [place] MFM UA CORD DOPPLER                76820.02    [facility] ----------------------------------------------------------------------  #  Order #                     Accession #                Episode #  1  [id number]                   [id number]                 [id number]  2  [id number]                   [id number]                 [id number]  3  [id number]                   [id number]                 [id number] ---------------------------------------------------------------------- Indications  Poor fetal growth, third trimester             O36.5930  Poor obstetric history: Previous preeclampsia  O09.299  Echogenic intracardiac focus of the heart      O35.8XX0  (EIF)  Encounter for other antenatal screening  Z36.2  follow-up  [redacted] weeks gestation of pregnancy                Z3A.33 ---------------------------------------------------------------------- Vital Signs  BP:          111/60 ---------------------------------------------------------------------- Fetal Evaluation  Num Of Fetuses:         1  Fetal Heart Rate(bpm):  135  Cardiac Activity:       Observed  Presentation:           Cephalic  Placenta:               Anterior  P. Cord Insertion:      Previously seen  Amniotic Fluid  AFI FV:      Within normal limits  AFI Sum(cm)     %Tile       Largest Pocket(cm)  17.49           64          5.61  RUQ(cm)       RLQ(cm)       LUQ(cm)        LLQ(cm)  5.61          3.9           4.5            3.48 ---------------------------------------------------------------------- Biophysical Evaluation  Amniotic F.V:   Within normal limits       F. Tone:        Observed  F. Movement:    Observed                   Score:          8/8  F. Breathing:   Observed ---------------------------------------------------------------------- Biometry  BPD:      86.3  mm     G. Age:  34w 6d         87  %    CI:        81.32   %    70 - 86                                                          FL/HC:      19.2   %     19.9 - 21.5  HC:      302.1  mm     G. Age:  33w 4d         23  %    HC/AC:      1.09        0.96 - 1.11  AC:      278.1  mm     G. Age:  31w 6d         17  %    FL/BPD:     67.1   %    71 - 87  FL:       57.9  mm     G. Age:  30w 2d        < 1  %    FL/AC:      20.8   %    20 - 24  LV:        4.1  mm  Est. FW:    1838  gm      4 lb 1 oz  10  % ---------------------------------------------------------------------- OB History  Gravidity:    4         Term:   1        Prem:   0        SAB:   1  TOP:          1       Ectopic:  0        Living: 1 ---------------------------------------------------------------------- Gestational Age  LMP:           57w 1d        Date:  10/10/22                 EDD:   07/17/23  U/S Today:     32w 5d                                        EDD:   01/04/24  Best:          33w 1d     Det. ByMarcella Dubs         EDD:   01/01/24                                      (05/09/23) ---------------------------------------------------------------------- Anatomy  Cranium:               Previously seen        Aortic Arch:            Previously seen  Cavum:                 Previously seen        Ductal Arch:            Previously seen  Ventricles:            Appears normal         Diaphragm:              Appears normal  Choroid Plexus:        Previously seen        Stomach:                Appears normal, left                                                                        sided  Cerebellum:            Previously seen        Abdomen:                Previously seen  Posterior Fossa:       Previously seen        Abdominal Wall:         Previously seen  Face:                  Orbits and profile     Cord Vessels:           Previously seen  previously seen  Lips:                  Previously seen        Kidneys:                Appear normal  Thoracic:              Previously seen        Bladder:                Appears normal  Heart:                 Appears normal          Spine:                  Previously seen                         (4CH, axis, and                         situs)  RVOT:                  Previously seen        Upper Extremities:      Previously seen  LVOT:                  Previously seen        Lower Extremities:      Previously seen ---------------------------------------------------------------------- Doppler - Fetal Vessels  Umbilical Artery   S/D     %tile      RI    %tile                      PSV    ADFV    RDFV                                                     (cm/s)   3.29       84     0.7       87                     45.72      No      No ---------------------------------------------------------------------- Cervix Uterus Adnexa  Cervix  Not visualized (advanced GA >24wks)  Uterus  No abnormality visualized.  Right Ovary  Not visualized.  Left Ovary  Not visualized.  Cul De Sac  No free fluid seen.  Adnexa  No abnormality visualized ---------------------------------------------------------------------- Comments  This patient was seen due to IUGR.  She denies any  problems since her last exam and reports feeling vigorous  fetal movements throughout the day.  On today's exam, the EFW of 4 pounds 1 ounces measures  at the 10th  percentile for her gestational age indicating  borderline IUGR.  The total AFI was 17.49 cm (within normal limits).  A BPP performed today was 8 out of 8.  Doppler studies of the umbilical arteries showed a normal  S/D ratio of 3.29 .  There were no signs of absent or reversed  end-diastolic flow.  Due to borderline IUGR, she should continue weekly NSTs in  your office for the next 2 weeks.  She will  return to our office in 3 weeks for another growth  scan and BPP. ----------------------------------------------------------------------                   Ma Rings, MD Electronically Signed Final Report   11/14/2023 02:25 pm ----------------------------------------------------------------------   [place] MFM UA CORD DOPPLER Result Date:  11/14/2023 ----------------------------------------------------------------------  OBSTETRICS REPORT                       (Signed Final 11/14/2023 02:25 pm) ---------------------------------------------------------------------- Patient Info  ID #:       [id number]                          D.O.B.:  [birth year] (27 yrs)(F)  Name:       Madeline Mccormick                    Visit Date: 11/14/2023 12:17 pm ---------------------------------------------------------------------- Performed By  Attending:        Ma Rings MD         Ref. Address:     [address]                                                             [place], [place]                                                             [address]  Performed By:     Dennis Bast RDMS      [facility]  Referred By:      [facility] ---------------------------------------------------------------------- Orders  #  Description                           Code  Ordered By  1  [place] MFM FETAL BPP WO NON               76819.01    BURK SCHAIBLE     STRESS  2  [place] MFM OB FOLLOW UP                   76816.01    BURK SCHAIBLE  3  [place] MFM UA CORD DOPPLER                76820.02    [facility] ----------------------------------------------------------------------  #  Order #                     Accession #                Episode #  1  [id number]                   [id number]                 [id number]  2  [id number]                   [id number]                 [id number]  3  [id number]                   [id number]                 [id number] ---------------------------------------------------------------------- Indications  Poor fetal growth, third trimester             O36.5930  Poor obstetric history: Previous preeclampsia   O09.299  Echogenic intracardiac focus of the heart      O35.8XX0  (EIF)  Encounter for other antenatal screening        Z36.2  follow-up  [redacted] weeks gestation of pregnancy                Z3A.33 ---------------------------------------------------------------------- Vital Signs  BP:          111/60 ---------------------------------------------------------------------- Fetal Evaluation  Num Of Fetuses:         1  Fetal Heart Rate(bpm):  135  Cardiac Activity:       Observed  Presentation:           Cephalic  Placenta:               Anterior  P. Cord Insertion:      Previously seen  Amniotic Fluid  AFI FV:      Within normal limits  AFI Sum(cm)     %Tile       Largest Pocket(cm)  17.49           64          5.61  RUQ(cm)       RLQ(cm)       LUQ(cm)        LLQ(cm)  5.61          3.9           4.5            3.48 ---------------------------------------------------------------------- Biophysical Evaluation  Amniotic F.V:   Within normal limits       F. Tone:        Observed  F. Movement:    Observed                   Score:  8/8  F. Breathing:   Observed ---------------------------------------------------------------------- Biometry  BPD:      86.3  mm     G. Age:  34w 6d         87  %    CI:        81.32   %    70 - 86                                                          FL/HC:      19.2   %    19.9 - 21.5  HC:      302.1  mm     G. Age:  33w 4d         23  %    HC/AC:      1.09        0.96 - 1.11  AC:      278.1  mm     G. Age:  31w 6d         17  %    FL/BPD:     67.1   %    71 - 87  FL:       57.9  mm     G. Age:  30w 2d        < 1  %    FL/AC:      20.8   %    20 - 24  LV:        4.1  mm  Est. FW:    1838  gm      4 lb 1 oz     10  % ---------------------------------------------------------------------- OB History  Gravidity:    4         Term:   1        Prem:   0        SAB:   1  TOP:          1       Ectopic:  0        Living: 1 ----------------------------------------------------------------------  Gestational Age  LMP:           57w 1d        Date:  10/10/22                 EDD:   07/17/23  U/S Today:     32w 5d                                        EDD:   01/04/24  Best:          33w 1d     Det. ByMarcella Dubs         EDD:   01/01/24                                      (05/09/23) ---------------------------------------------------------------------- Anatomy  Cranium:               Previously seen        Aortic Arch:  Previously seen  Cavum:                 Previously seen        Ductal Arch:            Previously seen  Ventricles:            Appears normal         Diaphragm:              Appears normal  Choroid Plexus:        Previously seen        Stomach:                Appears normal, left                                                                        sided  Cerebellum:            Previously seen        Abdomen:                Previously seen  Posterior Fossa:       Previously seen        Abdominal Wall:         Previously seen  Face:                  Orbits and profile     Cord Vessels:           Previously seen                         previously seen  Lips:                  Previously seen        Kidneys:                Appear normal  Thoracic:              Previously seen        Bladder:                Appears normal  Heart:                 Appears normal         Spine:                  Previously seen                         (4CH, axis, and                         situs)  RVOT:                  Previously seen        Upper Extremities:      Previously seen  LVOT:                  Previously seen        Lower Extremities:      Previously seen ---------------------------------------------------------------------- Doppler - Fetal Vessels  Umbilical Artery  S/D     %tile      RI    %tile                      PSV    ADFV    RDFV                                                     (cm/s)   3.29       84     0.7       87                     45.72      No      No  ---------------------------------------------------------------------- Cervix Uterus Adnexa  Cervix  Not visualized (advanced GA >24wks)  Uterus  No abnormality visualized.  Right Ovary  Not visualized.  Left Ovary  Not visualized.  Cul De Sac  No free fluid seen.  Adnexa  No abnormality visualized ---------------------------------------------------------------------- Comments  This patient was seen due to IUGR.  She denies any  problems since her last exam and reports feeling vigorous  fetal movements throughout the day.  On today's exam, the EFW of 4 pounds 1 ounces measures  at the 10th  percentile for her gestational age indicating  borderline IUGR.  The total AFI was 17.49 cm (within normal limits).  A BPP performed today was 8 out of 8.  Doppler studies of the umbilical arteries showed a normal  S/D ratio of 3.29 .  There were no signs of absent or reversed  end-diastolic flow.  Due to borderline IUGR, she should continue weekly NSTs in  your office for the next 2 weeks.  She will return to our office in 3 weeks for another growth  scan and BPP. ----------------------------------------------------------------------                   Ma Rings, MD Electronically Signed Final Report   11/14/2023 02:25 pm ----------------------------------------------------------------------    Assessment and Plan:  Pregnancy: [id number] at [redacted]w[redacted]d 1. Low baseline fetal heart rate (Primary) Auscultated FHR with doppler 105-110s, with audible accelerations to 120s.  Given low baseline, will obtain NST, will follow up results and manage accordingly.  Of note, recently had BPP 8/8/ on 12/08/23, resolved IUGR (EFW 28%). - Fetal nonstress test performed today  in office was reviewed and was found to be reactive with a baseline of 120s.  Patient reassured, will continue NSTs as needed.   2. History of pre-eclampsia in prior pregnancy, currently pregnant Stable BP, on ASA.  3. [redacted] weeks gestation of pregnancy 4. Supervision of  other normal pregnancy, antepartum Cultures done today, will follow up results and manage accordingly. - Cervicovaginal ancillary only - Culture, beta strep (group b only) Term labor symptoms and general obstetric precautions including but not limited to vaginal bleeding, contractions, leaking of fluid and fetal movement were reviewed in detail with the patient. Please refer to After Visit Summary for other counseling recommendations.   Return in about 1 week (around 12/18/2023) for OFFICE OB VISIT (MD or APP).  Future Appointments  Date Time Provider Department Center  12/25/2023  8:15 AM Warden Fillers, MD [facility]  01/04/2024  2:00 PM Arfeen, Phillips Grout, MD [facility] None    Jaynie Collins, MD

## 2023-12-12 LAB — CERVICOVAGINAL ANCILLARY ONLY
Chlamydia: NEGATIVE
Comment: NEGATIVE
Comment: NORMAL
Neisseria Gonorrhea: NEGATIVE

## 2023-12-14 IMAGING — US US EXTREM LOW VENOUS*R*
2 series · 14 of 24 positions shown · non-contrast
Comparison: None.

CLINICAL DATA: 25-year-old female with RIGHT LOWER extremity pain
and bruising.

EXAM:
RIGHT LOWER EXTREMITY VENOUS DOPPLER ULTRASOUND
TECHNIQUE: Gray-scale sonography with compression, as well as color and duplex
ultrasound, were performed to evaluate the deep venous system(s)
from the level of the common femoral vein through the popliteal and
proximal calf veins.

[Series 1: us extrem low venous*right* · 11 of 42 slices shown (1 of 2)]
[im 1/42]
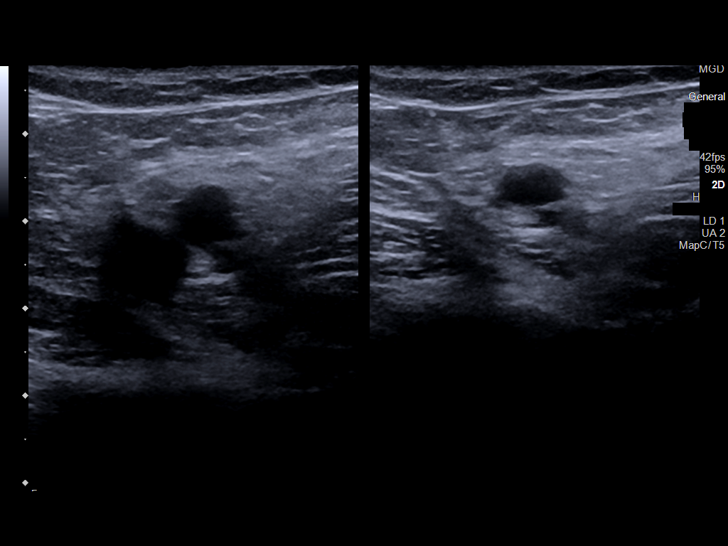
[im 5/42]
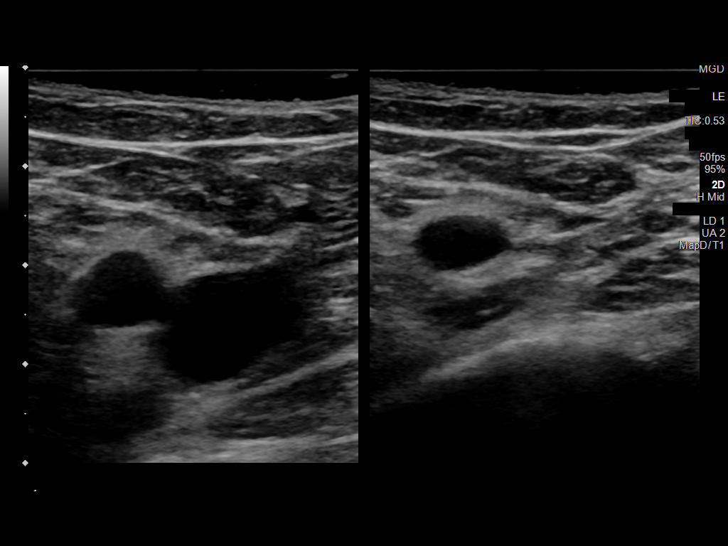
[im 10/42]
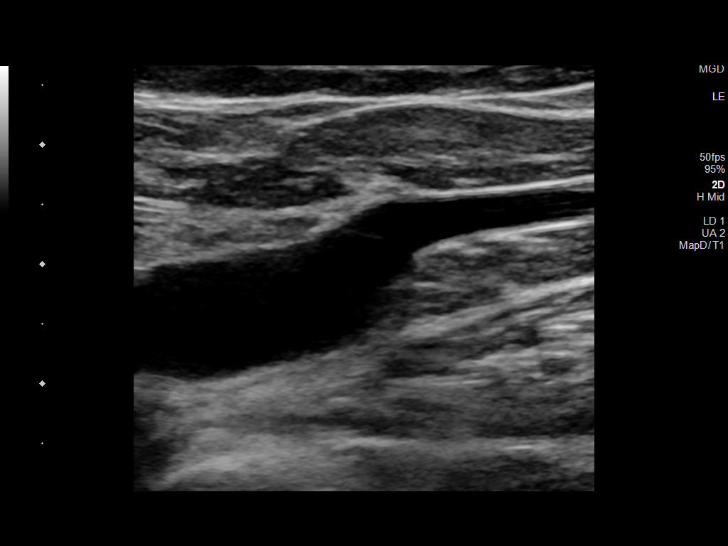
[im 14/42]
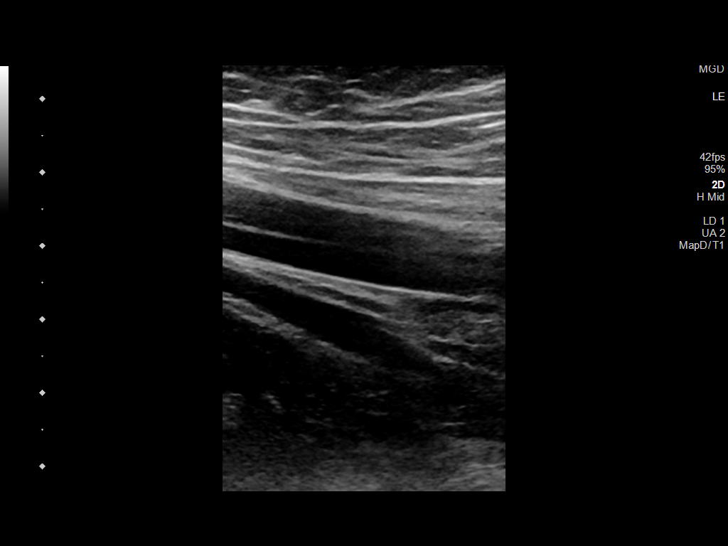
[im 16/42]
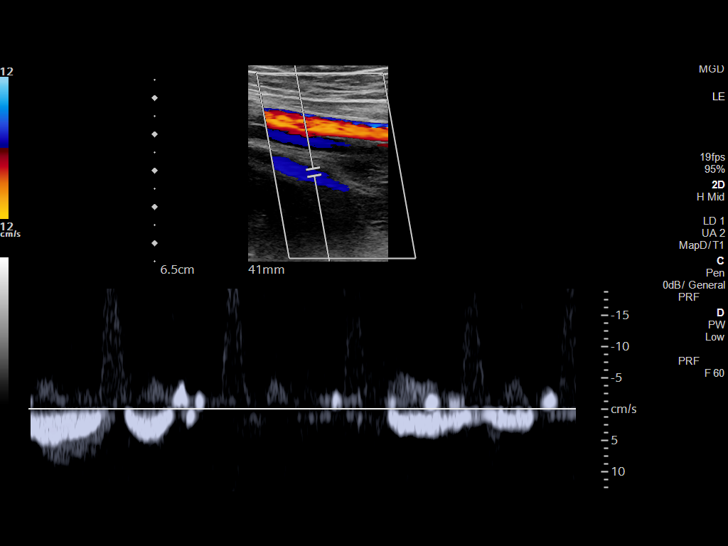
[im 21/42]
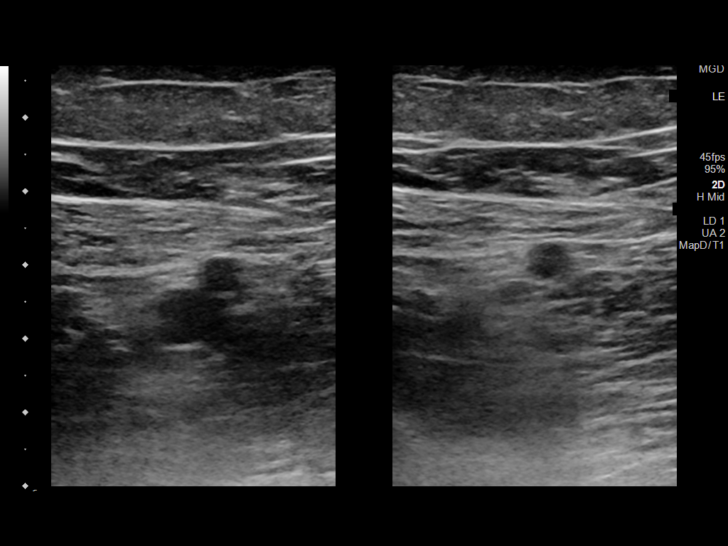
[im 26/42]
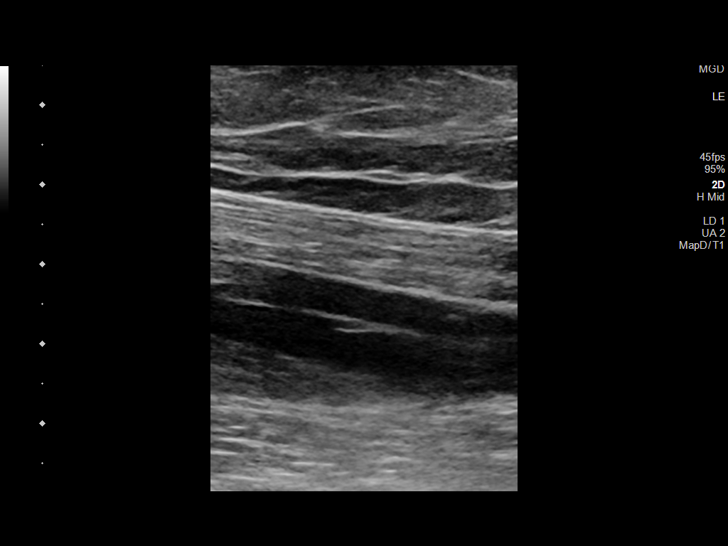
[im 28/42]
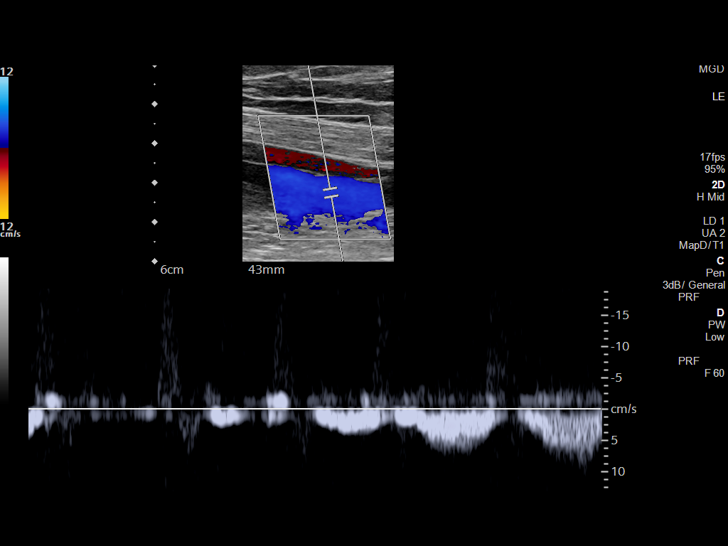
[im 32/42]
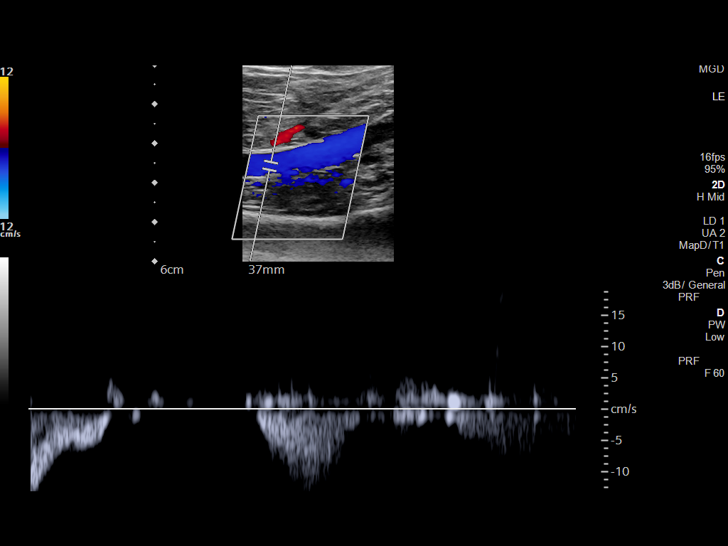
[im 37/42]
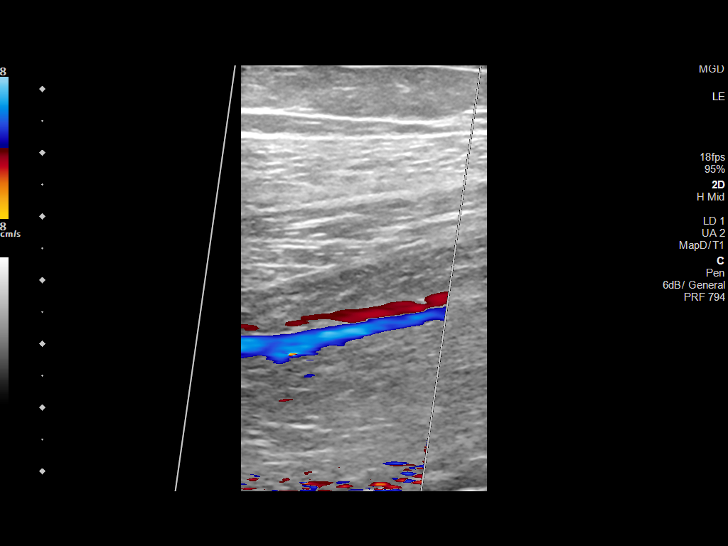
[im 42/42]
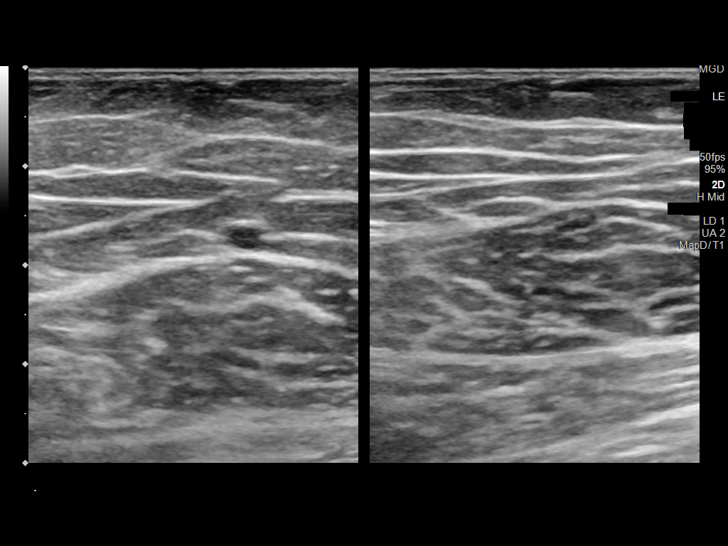

[Series 2: us extrem low venous*right* · 3 of 12 slices shown (2 of 2)]
[im 1/12]
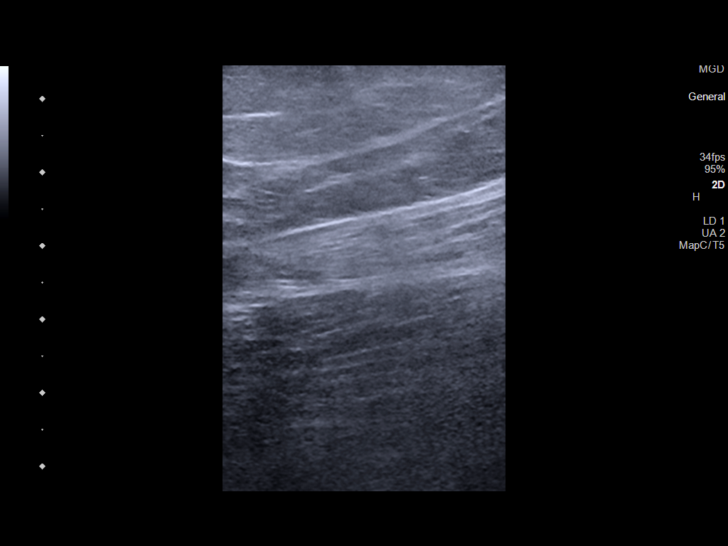
[im 6/12]
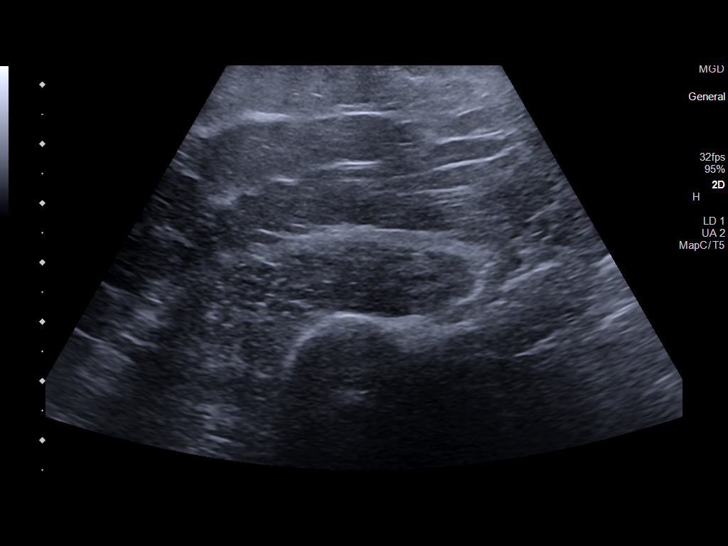
[im 12/12]
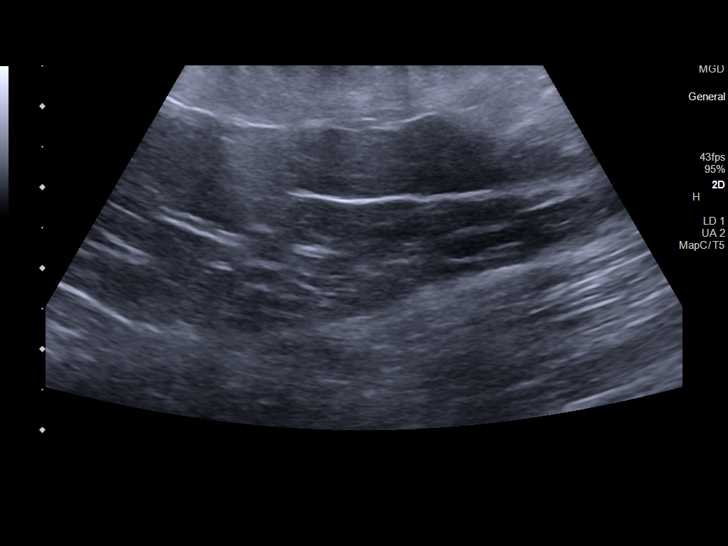

[14 of 24 positions shown; findings below may reference images not displayed]

FINDINGS: VENOUS

There is no evidence of DVT within the RIGHT common femoral,
superficial femoral, popliteal veins, or visualized calf veins.
Visualized portions of profunda femoral vein and great saphenous
vein unremarkable.

Limited views of the contralateral common femoral vein are
unremarkable.

OTHER

No focal sonographic abnormalities are noted in the areas of
patient's bruising.

Limitations: none
IMPRESSION: 1. No evidence of RIGHT LOWER extremity DVT.
2. No sonographic abnormalities in the areas of patient's bruising.

## 2023-12-15 LAB — CULTURE, BETA STREP (GROUP B ONLY): Strep Gp B Culture: NEGATIVE

## 2023-12-20 NOTE — L&D Delivery Note (Signed)
 OB/GYN Faculty Practice Delivery Note  Madeline Mccormick is a 28 y.o. [id number] s/p SVD at [redacted]w[redacted]d. She was admitted for spontaneous onset of labor.   ROM: 1h 59m with mild meconium stained fluid GBS Status: Negative/-- (12/23 1258) Maximum Maternal Temperature: 97.25F   Labor Progress: Initial SVE: 3/70/-1. She then progressed to complete.   Delivery Date/Time: 01/01/24 0313 Delivery: Called to room and patient was complete and pushing. Head delivered ROA with compound right hand presentation. No nuchal present. Shoulder and body delivered in usual fashion. Infant with spontaneous cry, placed on mother's abdomen, dried and stimulated. Cord clamped x 2 after 1-minute delay, and cut by grandmother of baby. Cord blood drawn. Placenta delivered spontaneously with gentle cord traction. Fundus firm with massage and Pitocin . Labia, perineum, vagina, and cervix inspected inspected with a second degree perineal laceration noted, which was repaired in the usual fashion.  Baby Weight: pending  Placenta: Sent to L&D Complications: None Lacerations: Second degree perineal laceration, repaired EBL: 404 mL Analgesia: Epidural   Infant:  APGAR (1 MIN): 9  APGAR (5 MINS): 9  Alain Sor, MD OB Fellow, Faculty Practice [facility]

## 2023-12-25 ENCOUNTER — Telehealth: Payer: Self-pay | Admitting: Family Medicine

## 2023-12-25 ENCOUNTER — Encounter: Payer: Medicaid Other | Admitting: Obstetrics and Gynecology

## 2023-12-25 NOTE — Telephone Encounter (Signed)
 Called to let patient know about her appt being rescheduled due to our 2 hour delay

## 2023-12-28 ENCOUNTER — Other Ambulatory Visit: Payer: Self-pay

## 2023-12-28 ENCOUNTER — Ambulatory Visit (INDEPENDENT_AMBULATORY_CARE_PROVIDER_SITE_OTHER): Payer: Medicaid Other | Admitting: Obstetrics & Gynecology

## 2023-12-28 VITALS — BP 116/77 | HR 72 | Wt 191.0 lb

## 2023-12-28 DIAGNOSIS — Z3480 Encounter for supervision of other normal pregnancy, unspecified trimester: Secondary | ICD-10-CM

## 2023-12-28 DIAGNOSIS — O09293 Supervision of pregnancy with other poor reproductive or obstetric history, third trimester: Secondary | ICD-10-CM

## 2023-12-28 DIAGNOSIS — Z3A39 39 weeks gestation of pregnancy: Secondary | ICD-10-CM

## 2023-12-28 DIAGNOSIS — O09299 Supervision of pregnancy with other poor reproductive or obstetric history, unspecified trimester: Secondary | ICD-10-CM

## 2023-12-28 NOTE — Progress Notes (Signed)
   PRENATAL VISIT NOTE  Subjective:  Madeline Mccormick is a 28 y.o. G4P1021 at [redacted]w[redacted]d being seen today for ongoing prenatal care.  She is currently monitored for the following issues for this low-risk pregnancy and has Iron  deficiency anemia; Supervision of other normal pregnancy, antepartum; and History of pre-eclampsia in prior pregnancy, currently pregnant on their problem list.  Patient reports no complaints.  Contractions: Not present. Vag. Bleeding: None.  Movement: Present. Denies leaking of fluid.   The following portions of the patient's history were reviewed and updated as appropriate: allergies, current medications, past family history, past medical history, past social history, past surgical history and problem list.   Objective:   Vitals:   12/28/23 1325  BP: 116/77  Pulse: 72  Weight: 191 lb (86.6 kg)    Fetal Status: Fetal Heart Rate (bpm): 125   Movement: Present  Presentation: Vertex  General:  Alert, oriented and cooperative. Patient is in no acute distress.  Skin: Skin is warm and dry. No rash noted.   Cardiovascular: Normal heart rate noted  Respiratory: Normal respiratory effort, no problems with respiration noted  Abdomen: Soft, gravid, appropriate for gestational age.  Pain/Pressure: Present     Pelvic: Cervical exam performed in the presence of a chaperone Dilation: 2 Effacement (%): 50 Station: Ballotable  Extremities: Normal range of motion.  Edema: None  Mental Status: Normal mood and affect. Normal behavior. Normal judgment and thought content.   Assessment and Plan:  Pregnancy: G4P1021 at [redacted]w[redacted]d 1. History of pre-eclampsia in prior pregnancy, currently pregnant (Primary) Nl BP  2. Supervision of other normal pregnancy, antepartum IOL 41 weeks prn  Term labor symptoms and general obstetric precautions including but not limited to vaginal bleeding, contractions, leaking of fluid and fetal movement were reviewed in detail with the patient. Please refer to  After Visit Summary for other counseling recommendations.   Return in about 1 week (around 01/04/2024).  Future Appointments  Date Time Provider Department Center  01/04/2024  2:00 PM Arfeen, Leni DASEN, MD [facility] None    Lynwood Solomons, MD

## 2023-12-31 ENCOUNTER — Inpatient Hospital Stay (HOSPITAL_COMMUNITY)
Admission: AD | Admit: 2023-12-31 | Discharge: 2024-01-03 | DRG: 807 | Disposition: A | Discharge status: Home / Self Care | Payer: Medicaid Other | Attending: Obstetrics and Gynecology | Admitting: Obstetrics and Gynecology

## 2023-12-31 ENCOUNTER — Encounter (HOSPITAL_COMMUNITY): Payer: Self-pay | Admitting: Obstetrics and Gynecology

## 2023-12-31 DIAGNOSIS — Z7982 Long term (current) use of aspirin: Secondary | ICD-10-CM | POA: Diagnosis not present

## 2023-12-31 DIAGNOSIS — Z3A40 40 weeks gestation of pregnancy: Secondary | ICD-10-CM | POA: Diagnosis not present

## 2023-12-31 DIAGNOSIS — O479 False labor, unspecified: Secondary | ICD-10-CM

## 2023-12-31 DIAGNOSIS — O326XX0 Maternal care for compound presentation, not applicable or unspecified: Secondary | ICD-10-CM | POA: Diagnosis present

## 2023-12-31 DIAGNOSIS — O365930 Maternal care for other known or suspected poor fetal growth, third trimester, not applicable or unspecified: Secondary | ICD-10-CM | POA: Diagnosis present

## 2023-12-31 DIAGNOSIS — Z833 Family history of diabetes mellitus: Secondary | ICD-10-CM | POA: Diagnosis not present

## 2023-12-31 DIAGNOSIS — O99214 Obesity complicating childbirth: Secondary | ICD-10-CM | POA: Diagnosis present

## 2023-12-31 DIAGNOSIS — D509 Iron deficiency anemia, unspecified: Secondary | ICD-10-CM | POA: Diagnosis present

## 2023-12-31 DIAGNOSIS — Z3493 Encounter for supervision of normal pregnancy, unspecified, third trimester: Secondary | ICD-10-CM

## 2023-12-31 DIAGNOSIS — Z87891 Personal history of nicotine dependence: Secondary | ICD-10-CM | POA: Diagnosis not present

## 2023-12-31 DIAGNOSIS — O09299 Supervision of pregnancy with other poor reproductive or obstetric history, unspecified trimester: Secondary | ICD-10-CM

## 2023-12-31 DIAGNOSIS — O134 Gestational [pregnancy-induced] hypertension without significant proteinuria, complicating childbirth: Secondary | ICD-10-CM | POA: Diagnosis present

## 2023-12-31 DIAGNOSIS — Z8249 Family history of ischemic heart disease and other diseases of the circulatory system: Secondary | ICD-10-CM

## 2023-12-31 DIAGNOSIS — O26893 Other specified pregnancy related conditions, third trimester: Secondary | ICD-10-CM | POA: Diagnosis present

## 2023-12-31 DIAGNOSIS — O9902 Anemia complicating childbirth: Secondary | ICD-10-CM | POA: Diagnosis present

## 2023-12-31 DIAGNOSIS — Z3480 Encounter for supervision of other normal pregnancy, unspecified trimester: Secondary | ICD-10-CM

## 2023-12-31 DIAGNOSIS — Z3A39 39 weeks gestation of pregnancy: Principal | ICD-10-CM

## 2023-12-31 DIAGNOSIS — O480 Post-term pregnancy: Secondary | ICD-10-CM | POA: Diagnosis not present

## 2023-12-31 LAB — CBC
HCT: 36.8 % (ref 36.0–46.0)
Hemoglobin: 12.2 g/dL (ref 12.0–15.0)
MCH: 29.5 pg (ref 26.0–34.0)
MCHC: 33.2 g/dL (ref 30.0–36.0)
MCV: 88.9 fL (ref 80.0–100.0)
Platelets: 309 10*3/uL (ref 150–400)
RBC: 4.14 MIL/uL (ref 3.87–5.11)
RDW: 12.2 % (ref 11.5–15.5)
WBC: 9.4 10*3/uL (ref 4.0–10.5)
nRBC: 0 % (ref 0.0–0.2)

## 2023-12-31 LAB — RUPTURE OF MEMBRANE (ROM)PLUS: Rom Plus: NEGATIVE

## 2023-12-31 MED ORDER — LACTATED RINGERS IV SOLN
500.0000 mL | Freq: Once | INTRAVENOUS | Status: AC
  Administered 2024-01-01: 500 mL via INTRAVENOUS

## 2023-12-31 MED ORDER — PHENYLEPHRINE 80 MCG/ML (10ML) SYRINGE FOR IV PUSH (FOR BLOOD PRESSURE SUPPORT)
80.0000 ug | PREFILLED_SYRINGE | INTRAVENOUS | Status: DC | PRN
  Filled 2023-12-31: qty 10

## 2023-12-31 MED ORDER — OXYTOCIN BOLUS FROM INFUSION
333.0000 mL | Freq: Once | INTRAVENOUS | Status: AC
  Administered 2024-01-01: 333 mL via INTRAVENOUS

## 2023-12-31 MED ORDER — OXYCODONE-ACETAMINOPHEN 5-325 MG PO TABS: 2.0000 | ORAL_TABLET | ORAL | Status: DC | PRN

## 2023-12-31 MED ORDER — PHENYLEPHRINE 80 MCG/ML (10ML) SYRINGE FOR IV PUSH (FOR BLOOD PRESSURE SUPPORT)
80.0000 ug | PREFILLED_SYRINGE | INTRAVENOUS | Status: AC | PRN
  Administered 2024-01-01 (×3): 80 ug via INTRAVENOUS
  Filled 2023-12-31: qty 10

## 2023-12-31 MED ORDER — LACTATED RINGERS IV SOLN
500.0000 mL | INTRAVENOUS | Status: DC | PRN
  Administered 2024-01-01 (×2): 500 mL via INTRAVENOUS

## 2023-12-31 MED ORDER — DIPHENHYDRAMINE HCL 50 MG/ML IJ SOLN: 12.5000 mg | INTRAMUSCULAR | Status: DC | PRN

## 2023-12-31 MED ORDER — ONDANSETRON HCL 4 MG/2ML IJ SOLN
4.0000 mg | Freq: Four times a day (QID) | INTRAMUSCULAR | Status: DC | PRN
  Administered 2024-01-01: 4 mg via INTRAVENOUS
  Filled 2023-12-31: qty 2

## 2023-12-31 MED ORDER — LIDOCAINE HCL (PF) 1 % IJ SOLN: 30.0000 mL | INTRAMUSCULAR | Status: DC | PRN

## 2023-12-31 MED ORDER — LACTATED RINGERS IV SOLN: INTRAVENOUS | Status: DC

## 2023-12-31 MED ORDER — OXYTOCIN-SODIUM CHLORIDE 30-0.9 UT/500ML-% IV SOLN
2.5000 [IU]/h | INTRAVENOUS | Status: DC
  Administered 2024-01-01: 2.5 [IU]/h via INTRAVENOUS
  Filled 2023-12-31: qty 500

## 2023-12-31 MED ORDER — OXYCODONE-ACETAMINOPHEN 5-325 MG PO TABS: 1.0000 | ORAL_TABLET | ORAL | Status: DC | PRN

## 2023-12-31 MED ORDER — FENTANYL-BUPIVACAINE-NACL 0.5-0.125-0.9 MG/250ML-% EP SOLN
12.0000 mL/h | EPIDURAL | Status: DC | PRN
  Administered 2024-01-01: 12 mL/h via EPIDURAL
  Filled 2023-12-31: qty 250

## 2023-12-31 MED ORDER — SOD CITRATE-CITRIC ACID 500-334 MG/5ML PO SOLN: 30.0000 mL | ORAL | Status: DC | PRN

## 2023-12-31 MED ORDER — FENTANYL CITRATE (PF) 100 MCG/2ML IJ SOLN: 50.0000 ug | INTRAMUSCULAR | Status: DC | PRN

## 2023-12-31 MED ORDER — ACETAMINOPHEN 325 MG PO TABS: 650.0000 mg | ORAL_TABLET | ORAL | Status: DC | PRN

## 2023-12-31 MED ORDER — EPHEDRINE 5 MG/ML INJ: 10.0000 mg | INTRAVENOUS | Status: DC | PRN

## 2023-12-31 NOTE — MAU Provider Note (Addendum)
  S: Madeline Mccormick is a 28 y.o. [phone] at [redacted]w[redacted]d  who presents to MAU today complaining contractions q 9 minutes since 1700. She denies vaginal bleeding. She endorses LOF; ferning and ROM plus negative. She reports normal fetal movement.    O: BP 120/73 (BP Location: Right Arm)   Pulse 65   Temp (!) 97.5 F (36.4 C) (Oral)   Resp 17   Ht 5' 4 (1.626 m)   Wt 88 kg   LMP  (Exact Date)   SpO2 97%   BMI 33.30 kg/m  GENERAL: Well-developed, well-nourished female in no acute distress.  HEAD: Normocephalic, atraumatic.  CHEST: Normal effort of breathing, regular heart rate ABDOMEN: Soft, nontender, gravid  Cervical exam:  Dilation: 3 Effacement (%): 70 Cervical Position: Anterior Station: -1 Presentation: Vertex Exam by:: Wendall Lunger RN   Fetal Monitoring: Baseline: 136 Variability: moderate Accelerations: present Decelerations: absent Contractions: q55min   A: SIUP at [redacted]w[redacted]d  False labor  P: Discharge home Labor and kick count precautions provided Continue routine prenatal care as scheduled  Loyola Fish, MD 12/31/2023 10:36 PM

## 2023-12-31 NOTE — MAU Note (Signed)
.  Madeline Mccormick is a 28 y.o. at [redacted]w[redacted]d here in MAU reporting: contractions with rule out SROM. Contractions became stronger today at 5pm. Possible SROM at 0900-fern slide collected. Denies complications with pregnancy reports pre-ecl with first. GBS -  Denies HSV  Onset of complaint: 0900 RO SROM, Contr. 1700 Pain score: 5 Vitals:   12/31/23 2116  BP: 120/73  Pulse: 65  Resp: 17  Temp: (!) 97.5 F (36.4 C)  SpO2: 98%     FHT:156bpm Lab orders placed from triage: mau labor

## 2024-01-01 ENCOUNTER — Other Ambulatory Visit: Payer: Self-pay

## 2024-01-01 ENCOUNTER — Inpatient Hospital Stay (HOSPITAL_COMMUNITY): Payer: Medicaid Other | Admitting: Anesthesiology

## 2024-01-01 ENCOUNTER — Encounter (HOSPITAL_COMMUNITY): Payer: Self-pay | Admitting: Obstetrics and Gynecology

## 2024-01-01 DIAGNOSIS — Z3A40 40 weeks gestation of pregnancy: Secondary | ICD-10-CM

## 2024-01-01 DIAGNOSIS — O480 Post-term pregnancy: Secondary | ICD-10-CM

## 2024-01-01 DIAGNOSIS — O365930 Maternal care for other known or suspected poor fetal growth, third trimester, not applicable or unspecified: Secondary | ICD-10-CM

## 2024-01-01 DIAGNOSIS — O326XX0 Maternal care for compound presentation, not applicable or unspecified: Secondary | ICD-10-CM

## 2024-01-01 DIAGNOSIS — O134 Gestational [pregnancy-induced] hypertension without significant proteinuria, complicating childbirth: Secondary | ICD-10-CM

## 2024-01-01 LAB — COMPREHENSIVE METABOLIC PANEL
ALT: 6 U/L (ref 0–44)
AST: 15 U/L (ref 15–41)
Albumin: 2.7 g/dL — ABNORMAL LOW (ref 3.5–5.0)
Alkaline Phosphatase: 153 U/L — ABNORMAL HIGH (ref 38–126)
Anion gap: 9 (ref 5–15)
BUN: 6 mg/dL (ref 6–20)
CO2: 20 mmol/L — ABNORMAL LOW (ref 22–32)
Calcium: 9 mg/dL (ref 8.9–10.3)
Chloride: 107 mmol/L (ref 98–111)
Creatinine, Ser: 0.48 mg/dL (ref 0.44–1.00)
GFR, Estimated: >60 mL/min (ref >60)
Glucose, Bld: 95 mg/dL (ref 70–99)
Potassium: 3.6 mmol/L (ref 3.5–5.1)
Sodium: 136 mmol/L (ref 135–145)
Total Bilirubin: 0.3 mg/dL (ref 0.0–1.2)
Total Protein: 6.3 g/dL — ABNORMAL LOW (ref 6.5–8.1)

## 2024-01-01 LAB — POCT FERN TEST

## 2024-01-01 LAB — RPR: RPR Ser Ql: NONREACTIVE

## 2024-01-01 MED ORDER — BENZOCAINE-MENTHOL 20-0.5 % EX AERO
1.0000 | INHALATION_SPRAY | CUTANEOUS | Status: DC | PRN
  Filled 2024-01-01: qty 56

## 2024-01-01 MED ORDER — LIDOCAINE HCL (PF) 1 % IJ SOLN
INTRAMUSCULAR | Status: DC | PRN
  Administered 2024-01-01: 5 mL via EPIDURAL
  Administered 2024-01-01: 4 mL via EPIDURAL

## 2024-01-01 MED ORDER — ONDANSETRON HCL 4 MG PO TABS: 4.0000 mg | ORAL_TABLET | ORAL | Status: DC | PRN

## 2024-01-01 MED ORDER — SODIUM CHLORIDE 0.9% FLUSH: 3.0000 mL | Freq: Two times a day (BID) | INTRAVENOUS | Status: DC

## 2024-01-01 MED ORDER — TRANEXAMIC ACID-NACL 1000-0.7 MG/100ML-% IV SOLN
INTRAVENOUS | Status: AC
  Filled 2024-01-01: qty 100

## 2024-01-01 MED ORDER — SODIUM CHLORIDE 0.9 % IV SOLN: 250.0000 mL | INTRAVENOUS | Status: DC | PRN

## 2024-01-01 MED ORDER — ACETAMINOPHEN 325 MG PO TABS
650.0000 mg | ORAL_TABLET | ORAL | Status: DC | PRN
  Administered 2024-01-01 – 2024-01-02 (×3): 650 mg via ORAL
  Filled 2024-01-01 (×4): qty 2

## 2024-01-01 MED ORDER — PHENYLEPHRINE 80 MCG/ML (10ML) SYRINGE FOR IV PUSH (FOR BLOOD PRESSURE SUPPORT): 80.0000 ug | PREFILLED_SYRINGE | INTRAVENOUS | Status: DC | PRN

## 2024-01-01 MED ORDER — SIMETHICONE 80 MG PO CHEW: 80.0000 mg | CHEWABLE_TABLET | ORAL | Status: DC | PRN

## 2024-01-01 MED ORDER — ALBUMIN HUMAN 5 % IV SOLN
12.5000 g | Freq: Once | INTRAVENOUS | Status: AC
  Administered 2024-01-01: 12.5 g via INTRAVENOUS
  Filled 2024-01-01: qty 250

## 2024-01-01 MED ORDER — SENNOSIDES-DOCUSATE SODIUM 8.6-50 MG PO TABS
2.0000 | ORAL_TABLET | ORAL | Status: DC
  Filled 2024-01-01 (×2): qty 2

## 2024-01-01 MED ORDER — SODIUM CHLORIDE 0.9% FLUSH: 3.0000 mL | INTRAVENOUS | Status: DC | PRN

## 2024-01-01 MED ORDER — IBUPROFEN 800 MG PO TABS
800.0000 mg | ORAL_TABLET | Freq: Three times a day (TID) | ORAL | Status: DC
  Administered 2024-01-01 – 2024-01-03 (×8): 800 mg via ORAL
  Filled 2024-01-01 (×8): qty 1

## 2024-01-01 MED ORDER — PRENATAL MULTIVITAMIN CH
1.0000 | ORAL_TABLET | Freq: Every day | ORAL | Status: DC
  Administered 2024-01-01 – 2024-01-03 (×3): 1 via ORAL
  Filled 2024-01-01 (×3): qty 1

## 2024-01-01 MED ORDER — COCONUT OIL OIL: 1.0000 | TOPICAL_OIL | Status: DC | PRN

## 2024-01-01 MED ORDER — DIBUCAINE (PERIANAL) 1 % EX OINT: 1.0000 | TOPICAL_OINTMENT | CUTANEOUS | Status: DC | PRN

## 2024-01-01 MED ORDER — TRANEXAMIC ACID-NACL 1000-0.7 MG/100ML-% IV SOLN
1000.0000 mg | Freq: Once | INTRAVENOUS | Status: AC
  Administered 2024-01-01: 1000 mg via INTRAVENOUS

## 2024-01-01 MED ORDER — ONDANSETRON HCL 4 MG/2ML IJ SOLN: 4.0000 mg | INTRAMUSCULAR | Status: DC | PRN

## 2024-01-01 MED ORDER — LACTATED RINGERS IV SOLN: 500.0000 mL | Freq: Once | INTRAVENOUS | Status: DC

## 2024-01-01 MED ORDER — DIPHENHYDRAMINE HCL 25 MG PO CAPS: 25.0000 mg | ORAL_CAPSULE | Freq: Four times a day (QID) | ORAL | Status: DC | PRN

## 2024-01-01 MED ORDER — WITCH HAZEL-GLYCERIN EX PADS: 1.0000 | MEDICATED_PAD | CUTANEOUS | Status: DC | PRN

## 2024-01-01 MED ORDER — EPHEDRINE 5 MG/ML INJ: 10.0000 mg | INTRAVENOUS | Status: DC | PRN

## 2024-01-01 NOTE — Anesthesia Postprocedure Evaluation (Signed)
 Anesthesia Post Note  Patient: Tomara Rosengrant  Procedure(s) Performed: AN AD HOC LABOR EPIDURAL     Patient location during evaluation: Mother Baby Anesthesia Type: Epidural Level of consciousness: awake and alert Pain management: pain level controlled Vital Signs Assessment: post-procedure vital signs reviewed and stable Respiratory status: spontaneous breathing, nonlabored ventilation and respiratory function stable Cardiovascular status: stable Postop Assessment: no headache, no backache and epidural receding Anesthetic complications: no   No notable events documented.  Last Vitals:  Vitals:   01/01/24 0623 01/01/24 1000  BP: (!) 103/52 (!) 90/49  Pulse: 70 66  Resp: 18 16  Temp: 36.7 C 36.7 C  SpO2: 99% 99%    Last Pain:  Vitals:   01/01/24 1209  TempSrc:   PainSc: 3    Pain Goal:                   Karena Kinker

## 2024-01-01 NOTE — Anesthesia Procedure Notes (Signed)
 Epidural Patient location during procedure: OB Start time: 01/01/2024 12:14 AM End time: 01/01/2024 12:16 AM  Staffing Anesthesiologist: Lucious Debby BRAVO, MD Performed: anesthesiologist   Preanesthetic Checklist Completed: patient identified, IV checked, risks and benefits discussed, monitors and equipment checked, pre-op evaluation and timeout performed  Epidural Patient position: sitting Prep: DuraPrep Patient monitoring: continuous pulse ox and blood pressure Approach: midline Location: L3-L4 Injection technique: LOR saline  Needle:  Needle type: Tuohy  Needle gauge: 17 G Needle length: 9 cm Needle insertion depth: 5 cm Catheter size: 19 Gauge Catheter at skin depth: 9 cm Test dose: negative and Other (1% lidocaine )  Assessment Events: blood not aspirated and no cerebrospinal fluid  Additional Notes Patient identified. Risks including, but not limited to, bleeding, infection, nerve damage, paralysis, inadequate analgesia, blood pressure changes, nausea, vomiting, allergic reaction, postpartum back pain, itching, and headache were discussed. Patient expressed understanding and wished to proceed. Sterile prep and drape, including hand hygiene, mask, and sterile gloves were used. The patient was positioned and the spine was prepped. The skin was anesthetized with lidocaine . No paraesthesia or other complication noted. The patient did not experience any signs of intravascular injection such as tinnitus or metallic taste in mouth, nor signs of intrathecal spread such as rapid motor block. Please see nursing notes for vital signs. The patient tolerated the procedure well.   Debby Lucious, MDReason for block:procedure for pain

## 2024-01-01 NOTE — Discharge Summary (Signed)
 Postpartum Discharge Summary    Patient Name: Madeline Mccormick DOB: May 03, 1996 MRN: [id number]  Date of admission: 12/31/2023 Delivery date:01/01/2024 Delivering provider: Melanie Spires Date of discharge: 01/03/2024  Admitting diagnosis: Normal labor [O80, Z37.9] Intrauterine pregnancy: [redacted]w[redacted]d     Secondary diagnosis:  Principal Problem:   Normal labor Active Problems:   Iron  deficiency anemia   Supervision of other normal pregnancy, antepartum   History of pre-eclampsia in prior pregnancy, currently pregnant  Additional problems: None    Discharge diagnosis: Term Pregnancy Delivered and Gestational Hypertension                                              Post partum procedures: None Augmentation: N/A Complications: None  Hospital course: Onset of Labor With Vaginal Delivery      28 y.o. yo O1H0865 at [redacted]w[redacted]d was admitted in Active Labor on 12/31/2023. Labor course was complicated by none  Membrane Rupture Time/Date: 1:36 AM,01/01/2024  Delivery Method:Vaginal, Spontaneous Operative Delivery:N/A Episiotomy: None Lacerations:  2nd degree;Perineal Patient had an uncomplicated postpartum course.  She is ambulating, tolerating a regular diet, passing flatus, and urinating well. Patient is discharged home in stable condition on 01/03/24.  Newborn Data: Birth date:01/01/2024 Birth time:3:13 AM Gender:Female Living status:Living Apgars:9 ,9  Weight:3430 g  Magnesium  Sulfate received: No BMZ received: No Rhophylac:N/A MMR:N/A T-DaP:Given prenatally Flu: No RSV Vaccine received: No Transfusion:No  Immunizations received: Immunization History  Administered Date(s) Administered   Influenza,inj,Quad PF,6+ Mos 10/09/2013, 04/24/2019, 10/17/2019   Tdap 07/31/2019, 10/02/2023    Physical exam  Vitals:   01/02/24 1419 01/02/24 1511 01/02/24 2026 01/03/24 0510  BP: 130/75 107/63 101/63 (!) 96/53  Pulse: 76 60 64 60  Resp: 18  18 18   Temp: 97.8 F (36.6 C)  98.1 F (36.7 C)  98.1 F (36.7 C)  TempSrc: Oral  Oral Oral  SpO2: 99%  100% 100%  Weight:      Height:       General: alert, cooperative, and no distress Lochia: appropriate Uterine Fundus: firm Incision: N/A DVT Evaluation: No evidence of DVT seen on physical exam. Labs: Lab Results  Component Value Date   WBC 9.4 12/31/2023   HGB 12.2 12/31/2023   HCT 36.8 12/31/2023   MCV 88.9 12/31/2023   PLT 309 12/31/2023      Latest Ref Rng & Units 12/31/2023   11:27 PM  CMP  Glucose 70 - 99 mg/dL 95   BUN 6 - 20 mg/dL 6   Creatinine 7.84 - 6.96 mg/dL 2.95   Sodium 284 - 132 mmol/L 136   Potassium 3.5 - 5.1 mmol/L 3.6   Chloride 98 - 111 mmol/L 107   CO2 22 - 32 mmol/L 20   Calcium 8.9 - 10.3 mg/dL 9.0   Total Protein 6.5 - 8.1 g/dL 6.3   Total Bilirubin 0.0 - 1.2 mg/dL 0.3   Alkaline Phos 38 - 126 U/L 153   AST 15 - 41 U/L 15   ALT 0 - 44 U/L 6    Edinburgh Score:    01/01/2024    3:42 PM  Edinburgh Postnatal Depression Scale Screening Tool  I have been able to laugh and see the funny side of things. 0  I have looked forward with enjoyment to things. 1  I have blamed myself unnecessarily when things went wrong. 2  I have  been anxious or worried for no good reason. 2  I have felt scared or panicky for no good reason. 1  Things have been getting on top of me. 2  I have been so unhappy that I have had difficulty sleeping. 0  I have felt sad or miserable. 2  I have been so unhappy that I have been crying. 2  The thought of harming myself has occurred to me. 0  Edinburgh Postnatal Depression Scale Total 12   No data recorded  After visit meds:  Allergies as of 01/03/2024   No Known Allergies      Medication List     TAKE these medications    acetaminophen  325 MG tablet Commonly known as: Tylenol  Take 2 tablets (650 mg total) by mouth every 4 (four) hours as needed (for pain scale < 4).   aspirin  EC 81 MG tablet Take 1 tablet (81 mg total) by mouth daily. Start after [redacted] weeks  gestation   hydrocortisone  2.5 % rectal cream Commonly known as: ANUSOL -HC Place rectally 2 (two) times daily.   ibuprofen  800 MG tablet Commonly known as: ADVIL  Take 1 tablet (800 mg total) by mouth every 8 (eight) hours.   prenatal vitamin w/FE, FA 27-1 MG Tabs tablet Take 1 tablet by mouth daily at 12 noon.         Discharge home in stable condition Infant Feeding: Breast Infant Disposition:rooming in Discharge instruction: per After Visit Summary and Postpartum booklet. Activity: Advance as tolerated. Pelvic rest for 6 weeks.  Diet: routine diet Future Appointments: Future Appointments  Date Time Provider Department Center  01/04/2024  2:00 PM Arfeen, Bronson Canny, MD [facility] None  02/01/2024  4:15 PM Granville Layer, MD [facility]   Follow up Visit:  Follow-up Information     Center for [facility] Follow up.   Specialty: Obstetrics and Gynecology Contact information: [address] [place]  [address] [phone]               Message sent to [facility] 01/01/24  Please schedule this patient for a In person postpartum visit in 4 weeks with the following provider: Any provider. Additional Postpartum F/U: None   Low risk pregnancy complicated by:  hx pre-e Delivery mode:  Vaginal, Spontaneous Anticipated Birth Control:  Unsure, considering IUD    01/03/2024 Ferdie Housekeeper, MD

## 2024-01-01 NOTE — Lactation Note (Signed)
 This note was copied from a baby's chart. Lactation Consultation Note  Patient Name: Madeline Mccormick Date: 01/01/2024 Age:30 hours Reason for consult: Initial assessment;1st time breastfeeding;Term;Breastfeeding assistance  P2- [name] reports that it hurts when infant nurses on the breast and she doesn't know if she is latching him incorrectly. Infant last ate 2 hrs ago, so LC recommended attempting a feeding. [name] agreed. [name] positioned infant on the right breast in the football hold. [name] had infant belly to belly and nose to nipple. Infant latched after 2 attempts. LC noted chomping from infant immediately. Infant demonstrated no rhythmical sucking at all at this time. LC informed [name] that this may be why her nipples are hurting when infant latches. LC offered [name] coconut oil to hel and [name] agreed. Coconut oil was provided to [name]. Infant nursed for 10 minutes with some stimulation to continue.[name] denies any questions or concerns at this time.  LC reviewed feeding infant on cue 8-12x in 24 hrs, not allowing infant to go over 3 hrs without a feeding, CDC milk storage guidelines and LC services handout. LC encouraged [name] to call for further assistance as needed.  Maternal Data Has patient been taught Hand Expression?: Yes Does the patient have breastfeeding experience prior to this delivery?: Yes How long did the patient breastfeed?: less than a day and then decided to switch to formula  Feeding Mother's Current Feeding Choice: Breast Milk  LATCH Score Latch: Repeated attempts needed to sustain latch, nipple held in mouth throughout feeding, stimulation needed to elicit sucking reflex.  Audible Swallowing: A few with stimulation  Type of Nipple: Everted at rest and after stimulation  Comfort (Breast/Nipple): Soft / non-tender  Hold (Positioning): Assistance needed to correctly position infant at breast and maintain latch.  LATCH Score: 7   Lactation Tools Discussed/Used Tools:  Coconut oil Pump Education: Milk Storage  Interventions Interventions: Breast feeding basics reviewed;Assisted with latch;Hand express;Breast compression;Adjust position;Support pillows;Position options;Coconut oil;Education;LC Services brochure  Discharge Discharge Education: Warning signs for feeding baby;Engorgement and breast care Pump: DEBP;Personal  Consult Status Consult Status: Follow-up Date: 01/02/24 Follow-up type: In-patient    Recardo Hoit BS, IBCLC 01/01/2024, 6:27 PM

## 2024-01-01 NOTE — Anesthesia Preprocedure Evaluation (Signed)
 Anesthesia Evaluation  Patient identified by MRN, date of birth, ID band Patient awake    Reviewed: Allergy & Precautions, NPO status , Patient's Chart, lab work & pertinent test results  History of Anesthesia Complications Negative for: history of anesthetic complications  Airway Mallampati: II   Neck ROM: Full    Dental   Pulmonary former smoker   Pulmonary exam normal        Cardiovascular negative cardio ROS Normal cardiovascular exam     Neuro/Psych  PSYCHIATRIC DISORDERS Anxiety Depression    negative neurological ROS     GI/Hepatic negative GI ROS, Neg liver ROS,,,  Endo/Other   Obesity   Renal/GU negative Renal ROS     Musculoskeletal negative musculoskeletal ROS (+)    Abdominal   Peds  (+) ADHD Hematology negative hematology ROS (+)  Plt 309k    Anesthesia Other Findings   Reproductive/Obstetrics (+) Pregnancy  Pre-E with prior pregnancy                              Anesthesia Physical Anesthesia Plan  ASA: 2  Anesthesia Plan: Epidural   Post-op Pain Management: Minimal or no pain anticipated   Induction:   PONV Risk Score and Plan: 2 and Treatment may vary due to age or medical condition  Airway Management Planned: Natural Airway  Additional Equipment: None  Intra-op Plan:   Post-operative Plan:   Informed Consent: I have reviewed the patients History and Physical, chart, labs and discussed the procedure including the risks, benefits and alternatives for the proposed anesthesia with the patient or authorized representative who has indicated his/her understanding and acceptance.       Plan Discussed with: Anesthesiologist  Anesthesia Plan Comments: (Labs reviewed. Platelets acceptable, patient not taking any blood thinning medications. Per RN, FHR tracing reported to be stable enough for sitting procedure. Risks and benefits discussed with patient,  including PDPH, backache, epidural hematoma, failed epidural, blood pressure changes, allergic reaction, and nerve injury. Patient expressed understanding and wished to proceed.)       Anesthesia Quick Evaluation

## 2024-01-01 NOTE — Lactation Note (Signed)
 This note was copied from a baby's chart. Lactation Consultation Note  Patient Name: Madeline Mccormick Unijb'd Date: 01/01/2024 Age:30 hours   P2- LC attempted to see [name] for a consult, but the tech was preparing for infant's bath at this time. LC informed family that LC would come back at another time.  Recardo Hoit BS, IBCLC 01/01/2024, 4:38 PM

## 2024-01-01 NOTE — H&P (Addendum)
 OBSTETRIC ADMISSION HISTORY AND PHYSICAL  Madeline Mccormick is a 28 y.o. female [phone] with IUP at [redacted]w[redacted]d by LMP presenting for SOL. She reports +FMs, No LOF, no VB, no blurry vision, headaches or peripheral edema, and RUQ pain.  She plans on breast feeding. She is undecided on postpartum birth control. She received her prenatal care at  [facility]    Dating: By US  --->  Estimated Date of Delivery: 01/01/24  Sono:    @[redacted]w[redacted]d , CWD, normal anatomy, cephalic presentation, anterior lie, 2715g, 28% EFW   Prenatal History/Complications:  Hx of preE in prior pregnancy; Fetal growth restriction antepartum/IUGR  Past Medical History: Past Medical History:  Diagnosis Date   ADHD    previously took Vivant - stopped 01/2019   Allergy    Anxiety    Depression, controlled 05/04/2014   5/15  03/2019 Now pregnant - takes Lexapro      History of pre-eclampsia 10/21/2019   LGSIL on Pap smear of cervix 06/23/2022   [ ]  rpt pap and hpv august/september 2024     8/28 colpo (adequate): cin 1 on biopsy. 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/15 chronic      Past Surgical History: Past Surgical History:  Procedure Laterality Date   BIOPSY  08/26/2022   Procedure: BIOPSY;  Surgeon: Shaaron Lamar HERO, MD;  [facility];  Service: Endoscopy;;   COLONOSCOPY WITH PROPOFOL  N/A 08/26/2022   Procedure: COLONOSCOPY WITH PROPOFOL ;  Surgeon: Shaaron Lamar HERO, MD;  [facility];  Service: Endoscopy;  Laterality: N/A;  11:15am   COLPOSCOPY  08/15/2022   DENTAL SURGERY     ESOPHAGOGASTRODUODENOSCOPY (EGD) WITH PROPOFOL  N/A 08/26/2022   Procedure: ESOPHAGOGASTRODUODENOSCOPY (EGD) WITH PROPOFOL ;  Surgeon: Shaaron Lamar HERO, MD;  Location[facility];  Service: Endoscopy;  Laterality: N/A;   THERAPEUTIC ABORTION      Obstetrical History: OB History     Gravida  4   Para   1   Term  1   Preterm      AB  2   Living  1      SAB  1   IAB  1   Ectopic      Multiple  0   Live Births  1           Social History Social History   Socioeconomic History   Marital status: Single    Spouse name: Not on file   Number of children: Not on file   Years of education: Not on file   Highest education level: Not on file  Occupational History    Comment: unemployed  Tobacco Use   Smoking status: Former    Types: E-cigarettes    Quit date: 04/2023    Years since quitting: 0.7   Smokeless tobacco: Never   Tobacco comments:    Vapes  Vaping Use   Vaping status: Former   Quit date: 04/19/2023  Substance and Sexual Activity   Alcohol use: No    Alcohol/week: 0.0 standard drinks of alcohol   Drug use: No   Sexual activity: Not Currently    Birth control/protection: None  Other Topics Concern   Not on file  Social History Narrative   Not on file   Social Drivers of Health   Financial Resource Strain: Low Risk  (  08/25/2023)   Received from [facility]   Overall Financial Resource Strain (CARDIA)    Difficulty of Paying Living Expenses: Not hard at all  Food Insecurity: No Food Insecurity (01/01/2024)   Hunger Vital Sign    Worried About Running Out of Food in the Last Year: Never true    Ran Out of Food in the Last Year: Never true  Transportation Needs: No Transportation Needs (01/01/2024)   PRAPARE - Administrator, Civil Service (Medical): No    Lack of Transportation (Non-Medical): No  Physical Activity: Sufficiently Active (08/25/2023)   Received from [facility]   Exercise Vital Sign    Days of Exercise per Week: 5 days    Minutes of Exercise per Session: 30 min  Stress: No Stress Concern Present (08/25/2023)   Received from [facility] - Occupational Stress Questionnaire    Feeling of Stress : Only a little  Social Connections: Unknown (08/25/2023)   Received from [facility]   Social Connection and Isolation Panel [NHANES]    Frequency of Communication with Friends and Family: More than three times a week    Frequency of Social Gatherings with Friends and Family: More than three times a week    Attends Religious Services: Patient declined    Database Administrator or Organizations: Patient declined    Attends Engineer, Structural: Patient declined    Marital Status: Never married    Family History: Family History  Problem Relation Age of Onset   Depression Mother    Diabetes Mother    Thyroid  disease Mother        radiation therapy   Other Mother        gastroparesis with stimulator   Cancer Maternal Grandmother    Arthritis Maternal Grandmother    Stroke Maternal Grandmother    Sudden death Maternal Grandmother    Heart disease Maternal Grandfather    Colon cancer Neg Hx    Colon polyps Neg Hx    Celiac disease Neg Hx    Hypertension Neg Hx     Allergies: No Known Allergies  Medications Prior to Admission  Medication Sig Dispense Refill Last Dose/Taking   aspirin  EC 81 MG tablet Take 1 tablet (81 mg total) by mouth daily. Start after [redacted] weeks gestation 30 tablet 9 12/30/2023   prenatal vitamin w/FE, FA (PRENATAL 1 + 1) 27-1 MG TABS tablet Take 1 tablet by mouth daily at 12 noon.   12/30/2023   hydrocortisone  (ANUSOL -HC) 2.5 % rectal cream Place rectally 2 (two) times daily. (Patient not taking: Reported on 11/28/2023) 30 g 2      Review of Systems   All systems reviewed and negative except as stated in HPI  Blood pressure 104/61, pulse 78, temperature (!) 97.5 F (36.4 C), temperature source Oral, resp. rate 17, height 5' 4 (1.626 m), weight 88 kg, SpO2 100%. General appearance: alert, cooperative, appears stated age, and mild distress Lungs: clear to auscultation bilaterally Heart: regular rate and rhythm Abdomen: soft, non-tender; bowel sounds normal Extremities: Homans sign is negative, no sign of DVT DTR's  present Presentation: cephalic Fetal monitoringBaseline: 130 bpm, Variability: Good {> 6 bpm), Accelerations: Reactive, and Decelerations: Absent Uterine activityFrequency: Every 2-4 minutes and Duration: 60-90 seconds Dilation: 6.5 Effacement (%): 80, 90 Station: 0 Exam by:: Darryle Kendall, RN   Prenatal labs: ABO, Rh: --/--/A POS (01/12 2327) Antibody: NEG (01/12 2327) Rubella:  2.18 (06/25 1629) RPR: Non Reactive (10/14 0821)  HBsAg: Negative (06/25 1629)  HIV: Non Reactive (10/14 0821)  GBS: Negative/-- (12/23 1258)    Lab Results  Component Value Date   GBS Negative 12/11/2023   GTT: normal Genetic screening:  Negative Anatomy US : WNL except EIF  Immunization History  Administered Date(s) Administered   Influenza,inj,Quad PF,6+ Mos 10/09/2013, 04/24/2019, 10/17/2019   Tdap 07/31/2019, 10/02/2023    Prenatal Transfer Tool  Maternal Diabetes: No Genetic Screening: Normal Maternal Ultrasounds/Referrals: IUGR Fetal Ultrasounds or other Referrals:  Referred to Materal Fetal Medicine  Maternal Substance Abuse:  No Significant Maternal Medications:  None Significant Maternal Lab Results: Group B Strep negative Number of Prenatal Visits:greater than 3 verified prenatal visits Maternal Vaccinations:TDap and Covid Other Comments:  None   Results for orders placed or performed during the hospital encounter of 12/31/23 (from the past 24 hours)  Rupture of Membrane (ROM) Plus   Collection Time: 12/31/23  9:53 PM  Result Value Ref Range   Rom Plus NEGATIVE   POCT fern test   Collection Time: 12/31/23 10:01 PM  Result Value Ref Range   POCT Fern Test     Negative    CBC   Collection Time: 12/31/23 11:27 PM  Result Value Ref Range   WBC 9.4 4.0 - 10.5 K/uL   RBC 4.14 3.87 - 5.11 MIL/uL   Hemoglobin 12.2 12.0 - 15.0 g/dL   HCT 63.1 63.9 - 53.9 %   MCV 88.9 80.0 - 100.0 fL   MCH 29.5 26.0 - 34.0 pg   MCHC 33.2 30.0 - 36.0 g/dL   RDW 87.7 88.4 - 84.4 %   Platelets 309  150 - 400 K/uL   nRBC 0.0 0.0 - 0.2 %  Comprehensive metabolic panel   Collection Time: 12/31/23 11:27 PM  Result Value Ref Range   Sodium 136 135 - 145 mmol/L   Potassium 3.6 3.5 - 5.1 mmol/L   Chloride 107 98 - 111 mmol/L   CO2 20 (L) 22 - 32 mmol/L   Glucose, Bld 95 70 - 99 mg/dL   BUN 6 6 - 20 mg/dL   Creatinine, Ser 9.51 0.44 - 1.00 mg/dL   Calcium 9.0 8.9 - 89.6 mg/dL   Total Protein 6.3 (L) 6.5 - 8.1 g/dL   Albumin  2.7 (L) 3.5 - 5.0 g/dL   AST 15 15 - 41 U/L   ALT 6 0 - 44 U/L   Alkaline Phosphatase 153 (H) 38 - 126 U/L   Total Bilirubin 0.3 0.0 - 1.2 mg/dL   GFR, Estimated >39 >39 mL/min   Anion gap 9 5 - 15  Type and screen [facility]   Collection Time: 12/31/23 11:27 PM  Result Value Ref Range   ABO/RH(D) A POS    Antibody Screen NEG    Sample Expiration      01/03/2024,2359 Performed at [facility], [address] [place], [place] [address]     Patient Active Problem List   Diagnosis Date Noted   Normal labor 12/31/2023   History of pre-eclampsia in prior pregnancy, currently pregnant 08/02/2023   Supervision of other normal pregnancy, antepartum 06/06/2023   Iron  deficiency anemia 02/18/2022    Assessment/Plan:  Reylene Jablon is a 28 y.o. G4P1021 at [redacted]w[redacted]d here for SOL.  #Labor: Expectant management #Pain: S/p epidural #FWB: Category I #GBS status:  negative #Feeding: Breastmilk  #Reproductive Life planning: Undecided #Circ:  yes, consent discussion below   [employer],  Medical Student 01/01/2024, 2:16 AM  I was personally present and performed or re-performed the history, physical exam and medical decision making activities of this service and have verified that the service and findings are accurately documented in the student's note.  Lauraine Norse, DO                  01/01/2024, 2:16 AM   Attestation of Supervision of Resident:  I confirm that I have verified the information documented in the resident's note and that I  have also personally reperformed the history, physical exam and all medical decision making activities.  I have verified that all services and findings are accurately documented in this resident's note; and I agree with management and plan as outlined in the documentation. I have also made any necessary editorial changes.  Alain Sor, MD OB Fellow, Faculty Practice [facility], Center for [facility]

## 2024-01-02 LAB — TYPE AND SCREEN

## 2024-01-02 LAB — BIRTH TISSUE RECOVERY COLLECTION (PLACENTA DONATION)

## 2024-01-02 NOTE — Progress Notes (Signed)
 POSTPARTUM PROGRESS NOTE  Post Partum Day : 1  Subjective:  Madeline Mccormick is a 28 y.o. [id number] s/p SVD at [redacted]w[redacted]d.  She reports she is doing well. No acute events overnight. She denies any problems with ambulating, voiding or po intake. Denies nausea or vomiting.  Pain is well controlled.  Lochia is normal and present.  Objective: Blood pressure 103/60, pulse 60, temperature 98 F (36.7 C), resp. rate 18, height 5' 4 (1.626 m), weight 88 kg, SpO2 97%, unknown if currently breastfeeding.  Physical Exam:  General: alert, cooperative and no distress Chest: no respiratory distress, CTA bilat. Heart:regular rate and rhythm Abdomen: soft, nontender, nondistended Uterine Fundus: firm, appropriately tender DVT Evaluation: No calf swelling or tenderness Extremities: no edema Skin: warm, dry  Recent Labs    12/31/23 2327  HGB 12.2  HCT 36.8    Assessment/Plan: Madeline Mccormick is a 28 y.o. H5E7977 s/p SVD at [redacted]w[redacted]d   PPD#1 - Doing well  Routine postpartum care Infant likely discharge on 01/03/24, circ held due to poor feeding and weight gain Contraception: unsure but considering IUD Feeding: breast and bottle Dispo: Plan for discharge : Anticipate discharge on 01/03/24.   LOS: 2 days   Jerilynn Buddle, MD Faculty attending 01/02/2024, 10:24 AM

## 2024-01-02 NOTE — Social Work (Signed)
 CSW received consult for hx of Anxiety and Depression.  CSW met with [name] to offer support and complete assessment. CSW entered the room and observed [name] in the recliner holding the infant and MGM at bedside. CSW introduced self, CSW role and reason for visit, [name] was agreeable and allowed MGM to remain in the room. CSW inquired about how [name] was feeling, [name] reported feeling tired. CSW inquired but [name] MH hx, [name] reported it was around 2015 when she was diagnosed with Anxiety and Depression. [name] reported she took medication in the past but none currently. [name] reported she does go to see her psychiatrist on Thursday. CSW inquired about [name] mood during pregnancy, [name] reported a down mood' but started it was manageable, and she did not feel she needed to seek help. CSW inquired abut her mood over the past 7 days, [name] reported being emotional due to hormones. CSW assessed for safety, [name] denied any SI or HI. CSW provided education regarding the baby blues period vs. perinatal mood disorders, discussed treatment and gave resources for mental health follow up if concerns arise.  CSW recommends self-evaluation during the postpartum time period using the New Mom Checklist from Postpartum Progress and encouraged [name] to contact a medical professional if symptoms are noted at any time. [name] identified her mom, grandma and first child's father as her supports.  CSW provided review of Sudden Infant Death Syndrome (SIDS) precautions.  [name] reported she has all necessary items for infant including a bassinet and car seat. CSW identifies no further need for intervention and no barriers to discharge at this time.  Daysi Boggan, LCSWA Clinical Social Worker [phone]

## 2024-01-03 ENCOUNTER — Encounter: Payer: Self-pay | Admitting: Family

## 2024-01-03 ENCOUNTER — Other Ambulatory Visit (HOSPITAL_COMMUNITY): Payer: Self-pay

## 2024-01-03 MED ORDER — IBUPROFEN 800 MG PO TABS
800.0000 mg | ORAL_TABLET | Freq: Three times a day (TID) | ORAL | 0 refills | Status: AC
  Filled 2024-01-03: qty 30, 10d supply, fill #0

## 2024-01-03 MED ORDER — ACETAMINOPHEN 325 MG PO TABS
650.0000 mg | ORAL_TABLET | ORAL | 0 refills | Status: AC | PRN
  Filled 2024-01-03: qty 60, 5d supply, fill #0

## 2024-01-04 ENCOUNTER — Telehealth (HOSPITAL_COMMUNITY): Payer: Medicaid Other | Admitting: Psychiatry

## 2024-01-04 ENCOUNTER — Encounter: Payer: Medicaid Other | Admitting: Obstetrics and Gynecology

## 2024-01-04 ENCOUNTER — Encounter (HOSPITAL_COMMUNITY): Payer: Self-pay | Admitting: Psychiatry

## 2024-01-04 ENCOUNTER — Ambulatory Visit (HOSPITAL_COMMUNITY): Payer: Self-pay

## 2024-01-04 VITALS — Wt 194.0 lb

## 2024-01-04 DIAGNOSIS — F331 Major depressive disorder, recurrent, moderate: Secondary | ICD-10-CM | POA: Diagnosis not present

## 2024-01-04 DIAGNOSIS — F419 Anxiety disorder, unspecified: Secondary | ICD-10-CM | POA: Diagnosis not present

## 2024-01-04 DIAGNOSIS — F902 Attention-deficit hyperactivity disorder, combined type: Secondary | ICD-10-CM | POA: Diagnosis not present

## 2024-01-04 MED ORDER — ESCITALOPRAM OXALATE 5 MG PO TABS: 5.0000 mg | ORAL_TABLET | Freq: Every day | ORAL | 0 refills | Status: DC

## 2024-01-04 NOTE — Progress Notes (Signed)
[facility] MD Virtual Progress Note   Patient Location: Hospital Provider Location: Office  I connect with patient by video and verified that I am speaking with correct person by using two identifiers. I discussed the limitations of evaluation and management by telemedicine and the availability of in person appointments. I also discussed with the patient that there may be a patient responsible charge related to this service. The patient expressed understanding and agreed to proceed.  Madeline Mccormick [id number] 28 y.o.  01/04/2024 12:07 PM  History of Present Illness:  Patient is evaluated by video session.  She is in the hospital and delivered the baby boy on January 13.  She is going to be discharged later today.  She admitted a lot of anxiety and nervousness but also very excited about the baby.  She admitted some postpartum blues started as feel nervous, anxious and having crying spells.  She is sleeping okay.  She has good support from her mother who was present with the patient.  Patient reported baby is fine and doing well.  She reported sleep is on and off.  She denies any suicidal thoughts or homicidal thoughts but a lot of worries, nervousness, feeling overwhelmed.  She had a support from her mother and her children's father.  She is a 20-year-old who is with the his father.  Patient denies any drinking or using any illegal substances.  She feels sometimes hopeless but no agitation, aggression, violence.  She used to take Lexapro, Wellbutrin and Vyvanse.  She like to go back on Lexapro for now at least to help with anxiety.  Her plan is to breast-feed for now.  She is thinking to go back to work in few weeks as like to stay with the newborn for now.  She appears tired and exhausted.  She has no tremors, shakes or any EPS.  She admitted her attention concentration is fair but able to manage.  She denies any major panic attack.  Past Psychiatric History: H/O ADHD diagnosed by PCP  but no formal psychological testing. H/O anxiety and depression. Good response with Vyvanse, Lexapro and Abilify. Meds d/c due to pregnancy. Tried BuSpar and Lamictal. Wellbutrin did not work. No h/o suicidal attempt, inpatient treatment, hallucination, OCD, PTSD or panic attacks.    Outpatient Encounter Medications as of 01/04/2024  Medication Sig   acetaminophen (TYLENOL) 325 MG tablet Take 2 tablets (650 mg total) by mouth every 4 (four) hours as needed (for pain scale < 4).   aspirin EC 81 MG tablet Take 1 tablet (81 mg total) by mouth daily. Start after [redacted] weeks gestation   hydrocortisone (ANUSOL-HC) 2.5 % rectal cream Place rectally 2 (two) times daily. (Patient not taking: Reported on 11/28/2023)   ibuprofen (ADVIL) 800 MG tablet Take 1 tablet (800 mg total) by mouth every 8 (eight) hours.   prenatal vitamin w/FE, FA (PRENATAL 1 + 1) 27-1 MG TABS tablet Take 1 tablet by mouth daily at 12 noon.   No facility-administered encounter medications on file as of 01/04/2024.    Recent Results (from the past 2160 hours)  Cervicovaginal ancillary only     Status: None   Collection Time: 12/11/23 11:02 AM  Result Value Ref Range   Neisseria Gonorrhea Negative    Chlamydia Negative    Comment Normal Reference Ranger Chlamydia - Negative    Comment      Normal Reference Range Neisseria Gonorrhea - Negative  Culture, beta strep (group b only)  Status: None   Collection Time: 12/11/23 12:58 PM   Specimen: Vaginal/Rectal; Genital   VR  Result Value Ref Range   Strep Gp B Culture Negative Negative    Comment: Centers for Disease Control and Prevention (CDC) and American Congress of Obstetricians and Gynecologists (ACOG) guidelines for prevention of perinatal group B streptococcal (GBS) disease specify co-collection of a vaginal and rectal swab specimen to maximize sensitivity of GBS detection. Per the CDC and ACOG, swabbing both the lower vagina and rectum substantially increases the yield  of detection compared with sampling the vagina alone. Penicillin G, ampicillin, or cefazolin are indicated for intrapartum prophylaxis of perinatal GBS colonization. Reflex susceptibility testing should be performed prior to use of clindamycin only on GBS isolates from penicillin-allergic women who are considered a high risk for anaphylaxis. Treatment with vancomycin without additional testing is warranted if resistance to clindamycin is noted.   Rupture of Membrane (ROM) Plus     Status: None   Collection Time: 12/31/23  9:53 PM  Result Value Ref Range   Rom Plus NEGATIVE     Comment: Performed at [facility], [address] [place], [place] [address]  POCT fern test     Status: Normal   Collection Time: 12/31/23 10:01 PM  Result Value Ref Range   POCT Fern Test     Negative    CBC     Status: None   Collection Time: 12/31/23 11:27 PM  Result Value Ref Range   WBC 9.4 4.0 - 10.5 K/uL   RBC 4.14 3.87 - 5.11 MIL/uL   Hemoglobin 12.2 12.0 - 15.0 g/dL   HCT 69.6 29.5 - 28.4 %   MCV 88.9 80.0 - 100.0 fL   MCH 29.5 26.0 - 34.0 pg   MCHC 33.2 30.0 - 36.0 g/dL   RDW 13.2 44.0 - 10.2 %   Platelets 309 150 - 400 K/uL   nRBC 0.0 0.0 - 0.2 %    Comment: Performed at [facility], [address], [place], [place] [address]  Type and screen [facility]     Status: None   Collection Time: 12/31/23 11:27 PM  Result Value Ref Range   ABO/RH(D) A POS    Antibody Screen NEG    Sample Expiration      01/03/2024,2359 Performed at [facility], [address], [place], [place] [address]   RPR     Status: None   Collection Time: 12/31/23 11:27 PM  Result Value Ref Range   RPR Ser Ql NON REACTIVE NON REACTIVE    Comment: Performed at [facility], [address], [place], [place] [address]  Comprehensive metabolic panel     Status: Abnormal   Collection Time: 12/31/23 11:27 PM  Result Value Ref Range   Sodium 136 135 - 145 mmol/L   Potassium 3.6  3.5 - 5.1 mmol/L   Chloride 107 98 - 111 mmol/L   CO2 20 (L) 22 - 32 mmol/L   Glucose, Bld 95 70 - 99 mg/dL    Comment: Glucose reference range applies only to samples taken after fasting for at least 8 hours.   BUN 6 6 - 20 mg/dL   Creatinine, Ser 9.56 0.44 - 1.00 mg/dL   Calcium 9.0 8.9 - 38.7 mg/dL   Total Protein 6.3 (L) 6.5 - 8.1 g/dL   Albumin 2.7 (L) 3.5 - 5.0 g/dL   AST 15 15 - 41 U/L   ALT  6 0 - 44 U/L   Alkaline Phosphatase 153 (H) 38 - 126 U/L   Total Bilirubin 0.3 0.0 - 1.2 mg/dL   GFR, Estimated >01 >02 mL/min    Comment: (NOTE) Calculated using the CKD-EPI Creatinine Equation (2021)    Anion gap 9 5 - 15    Comment: Performed at [facility], [address] [place], [place] [address]  Collect bld for placenta donatation     Status: None   Collection Time: 01/02/24  6:26 AM  Result Value Ref Range   Placenta donation bld collect Collected by Laboratory     Comment: Performed at [facility], [address] [place], [place] [address]     Psychiatric Specialty Exam: Physical Exam  Review of Systems  Psychiatric/Behavioral:  Positive for dysphoric mood and sleep disturbance.     Weight 194 lb (88 kg), unknown if currently breastfeeding.There is no height or weight on file to calculate BMI.  General Appearance:  tired   Eye Contact:  Fair  Speech:  Slow  Volume:  Decreased  Mood:  Anxious and Dysphoric  Affect:  Congruent  Thought Process:  Descriptions of Associations: Intact  Orientation:  Full (Time, Place, and Person)  Thought Content:  Rumination  Suicidal Thoughts:  No  Homicidal Thoughts:  No  Memory:  Immediate;   Good Recent;   Good Remote;   Good  Judgement:  Intact  Insight:  Present  Psychomotor Activity:  Decreased  Concentration:  Concentration: Fair and Attention Span: Good  Recall:  Good  Fund of Knowledge:  Good  Language:  Good  Akathisia:  No  Handed:  Right  AIMS (if indicated):     Assets:  Communication  Skills Desire for Improvement Housing Resilience Social Support  ADL's:  Intact  Cognition:  WNL  Sleep:  fair     Assessment/Plan: MDD (major depressive disorder), recurrent episode, moderate (HCC) - Plan: escitalopram (LEXAPRO) 5 MG tablet  Anxiety - Plan: escitalopram (LEXAPRO) 5 MG tablet  Attention deficit hyperactivity disorder (ADHD), combined type - Plan: escitalopram (LEXAPRO) 5 MG tablet  Discussed anxiety and nervousness.  Patient recently had a baby boy 2 days ago.  I review notes, blood work results and collateral information from other providers.  Patient admitted feeling overwhelmed and having postpartum blues but she had a support from her mother and children's father.  I discussed with the patient restarting the Lexapro.  After reviewing low risk of transfer from breast-feeding, she agreed to restart low-dose.  I also encouraged to discuss with OB/GYN and we will starting Lexapro 5 mg daily.  She used to take 20 mg in the past along with Wellbutrin and Vyvanse.  We talk about differing other medication at this time.  I encouraged to call us back if she notices worsening of symptoms or having any side effects or any time having suicidal thoughts.  Follow-up in 4 to 6 weeks.   Follow Up Instructions:     I discussed the assessment and treatment plan with the patient. The patient was provided an opportunity to ask questions and all were answered. The patient agreed with the plan and demonstrated an understanding of the instructions.   The patient was advised to call back or seek an in-person evaluation if the symptoms worsen or if the condition fails to improve as anticipated.    Collaboration of Care: Other provider involved in patient's care AEB notes are available in epic to review  Patient/Guardian was  advised Release of Information must be obtained prior to any record release in order to collaborate their care with an outside provider. Patient/Guardian was advised if  they have not already done so to contact the registration department to sign all necessary forms in order for [place] to release information regarding their care.   Consent: Patient/Guardian gives verbal consent for treatment and assignment of benefits for services provided during this visit. Patient/Guardian expressed understanding and agreed to proceed.     I provided 22 minutes of non face to face time during this encounter.  Note: This document was prepared by [employer] voice dictation technology and any errors that results from this process are unintentional.    Cleotis Nipper, MD 01/04/2024

## 2024-01-04 NOTE — Lactation Note (Signed)
This note was copied from a baby's chart. Lactation Consultation Note  Patient Name: Madeline Mccormick [id number] Date: 01/04/2024 Age:30 hours  Mom is formula feeding.  Maternal Data    Feeding    LATCH Score                    Lactation Tools Discussed/Used    Interventions    Discharge    Consult Status Consult Status: Complete    [name] G 01/04/2024, 5:34 AM

## 2024-01-08 ENCOUNTER — Inpatient Hospital Stay (HOSPITAL_COMMUNITY)
Admission: AD | Admit: 2024-01-08 | Discharge: 2024-01-08 | Disposition: A | Discharge status: Home / Self Care | Payer: Medicaid Other | Attending: Obstetrics and Gynecology | Admitting: Obstetrics and Gynecology

## 2024-01-08 ENCOUNTER — Other Ambulatory Visit: Payer: Self-pay

## 2024-01-08 ENCOUNTER — Encounter: Payer: Self-pay | Admitting: Obstetrics & Gynecology

## 2024-01-08 ENCOUNTER — Encounter (HOSPITAL_COMMUNITY): Payer: Self-pay | Admitting: Obstetrics and Gynecology

## 2024-01-08 DIAGNOSIS — O165 Unspecified maternal hypertension, complicating the puerperium: Secondary | ICD-10-CM | POA: Insufficient documentation

## 2024-01-08 DIAGNOSIS — R32 Unspecified urinary incontinence: Secondary | ICD-10-CM | POA: Diagnosis not present

## 2024-01-08 DIAGNOSIS — O9089 Other complications of the puerperium, not elsewhere classified: Secondary | ICD-10-CM | POA: Diagnosis not present

## 2024-01-08 LAB — CBC WITH DIFFERENTIAL/PLATELET
Abs Immature Granulocytes: 0.06 10*3/uL (ref 0.00–0.07)
Basophils Absolute: 0 10*3/uL (ref 0.0–0.1)
Basophils Relative: 1 %
Eosinophils Absolute: 0.3 10*3/uL (ref 0.0–0.5)
Eosinophils Relative: 5 %
HCT: 31.7 % — ABNORMAL LOW (ref 36.0–46.0)
Hemoglobin: 10.3 g/dL — ABNORMAL LOW (ref 12.0–15.0)
Immature Granulocytes: 1 %
Lymphocytes Relative: 31 %
Lymphs Abs: 1.9 10*3/uL (ref 0.7–4.0)
MCH: 29.2 pg (ref 26.0–34.0)
MCHC: 32.5 g/dL (ref 30.0–36.0)
MCV: 89.8 fL (ref 80.0–100.0)
Monocytes Absolute: 0.6 10*3/uL (ref 0.1–1.0)
Monocytes Relative: 9 %
Neutro Abs: 3.3 10*3/uL (ref 1.7–7.7)
Neutrophils Relative %: 53 %
Platelets: 322 10*3/uL (ref 150–400)
RBC: 3.53 MIL/uL — ABNORMAL LOW (ref 3.87–5.11)
RDW: 12.5 % (ref 11.5–15.5)
WBC: 6.1 10*3/uL (ref 4.0–10.5)
nRBC: 0 % (ref 0.0–0.2)

## 2024-01-08 LAB — COMPREHENSIVE METABOLIC PANEL
ALT: 7 U/L (ref 0–44)
AST: 13 U/L — ABNORMAL LOW (ref 15–41)
Albumin: 2.8 g/dL — ABNORMAL LOW (ref 3.5–5.0)
Alkaline Phosphatase: 100 U/L (ref 38–126)
Anion gap: 7 (ref 5–15)
BUN: 10 mg/dL (ref 6–20)
CO2: 23 mmol/L (ref 22–32)
Calcium: 8.3 mg/dL — ABNORMAL LOW (ref 8.9–10.3)
Chloride: 108 mmol/L (ref 98–111)
Creatinine, Ser: 0.69 mg/dL (ref 0.44–1.00)
GFR, Estimated: >60 mL/min (ref >60)
Glucose, Bld: 81 mg/dL (ref 70–99)
Potassium: 3.6 mmol/L (ref 3.5–5.1)
Sodium: 138 mmol/L (ref 135–145)
Total Bilirubin: 0.5 mg/dL (ref 0.0–1.2)
Total Protein: 6 g/dL — ABNORMAL LOW (ref 6.5–8.1)

## 2024-01-08 MED ORDER — ACETAMINOPHEN-CAFFEINE 500-65 MG PO TABS
2.0000 | ORAL_TABLET | Freq: Once | ORAL | Status: AC
Start: 2024-01-08 — End: 2024-01-08
  Administered 2024-01-08: 2 via ORAL
  Filled 2024-01-08: qty 2

## 2024-01-08 MED ORDER — NIFEDIPINE ER OSMOTIC RELEASE 30 MG PO TB24: 30.0000 mg | ORAL_TABLET | Freq: Every day | ORAL | 1 refills | Status: DC

## 2024-01-08 MED ORDER — FUROSEMIDE 20 MG PO TABS
20.0000 mg | ORAL_TABLET | Freq: Once | ORAL | Status: AC
Start: 2024-01-08 — End: 2024-01-08
  Administered 2024-01-08: 20 mg via ORAL
  Filled 2024-01-08: qty 1

## 2024-01-08 MED ORDER — NIFEDIPINE 10 MG PO CAPS
30.0000 mg | ORAL_CAPSULE | Freq: Once | ORAL | Status: AC
  Administered 2024-01-08: 30 mg via ORAL
  Filled 2024-01-08: qty 3

## 2024-01-08 MED ORDER — CYCLOBENZAPRINE HCL 5 MG PO TABS: 5.0000 mg | ORAL_TABLET | Freq: Three times a day (TID) | ORAL | 0 refills | Status: DC | PRN

## 2024-01-08 MED ORDER — FUROSEMIDE 20 MG PO TABS: 20.0000 mg | ORAL_TABLET | Freq: Every day | ORAL | 0 refills | Status: DC

## 2024-01-08 NOTE — MAU Provider Note (Signed)
None     S [name] is a 28 y.o. [phone] postpartum female who presents to MAU today with complaint of headache which prompted her to check her blood pressure. She called the OB office and they said to try Tylenol Tension for relief and it only helped a little, so she came in. She reports a history of PP Pre-E with a previous birth and that required magnesium. Reports epigastric pain yesterday. S/P NSVB 01/01/24.    Receives care at [facility]. Prenatal records reviewed.  Pertinent items noted in HPI and remainder of comprehensive ROS otherwise negative.   O BP (!) 97/51   Pulse 90   Temp 98.1 F (36.7 C) (Oral)   Resp 18   Ht 5\' 4"  (1.626 m)   Wt 82.6 kg   SpO2 100%   BMI 31.26 kg/m  Physical Exam Vitals reviewed.  Constitutional:      Appearance: Normal appearance.  HENT:     Head: Normocephalic.  Cardiovascular:     Rate and Rhythm: Normal rate.     Pulses: Normal pulses.  Pulmonary:     Effort: Pulmonary effort is normal.  Skin:    General: Skin is warm and dry.     Capillary Refill: Capillary refill takes less than 2 seconds.  Neurological:     Mental Status: She is alert and oriented to person, place, and time.  Psychiatric:        Mood and Affect: Mood normal.        Behavior: Behavior normal.        Thought Content: Thought content normal.        Judgment: Judgment normal.      MDM: Reviewed records Pateint history Labs: CBC, CMP, PC ratio Consulted Dr. Berton Mccormick  MAU Course:  A Postpartum hypertension - Plan: Discharge patient  Urinary incontinence, unspecified type - Plan: Ambulatory referral to Physical Therapy, Discharge patient  Medical screening exam complete  P - Initiated procardia 30XL daily, patient to begin 01/09/24. Sent to preferred pharmacy. - Prescription for lasix for 5 days sent to pharmacy, to begin 01/09/24. - Sent message to [facility] MCW to schedule for BP check. - Referral to PFT for patient complaint of urinary incontinence.    Discharge from MAU in stable condition with routine precautions Follow up at [facility]  as scheduled for blood pressure check and return to MAU as needed.   Allergies as of 01/08/2024   No Active Allergies      Medication List     TAKE these medications    acetaminophen 325 MG tablet Commonly known as: Tylenol Take 2 tablets (650 mg total) by mouth every 4 (four) hours as needed (for pain scale < 4).   aspirin EC 81 MG tablet Take 1 tablet (81 mg total) by mouth daily. Start after [redacted] weeks gestation   cyclobenzaprine 5 MG tablet Commonly known as: FLEXERIL Take 1 tablet (5 mg total) by mouth 3 (three) times daily as needed (headache pain).   escitalopram 5 MG tablet Commonly known as: Lexapro Take 1 tablet (5 mg total) by mouth daily.   furosemide 20 MG tablet Commonly known as: Lasix Take 1 tablet (20 mg total) by mouth daily for 5 days.   hydrocortisone 2.5 % rectal cream Commonly known as: ANUSOL-HC Place rectally 2 (two) times daily.   ibuprofen 800 MG tablet Commonly known as: ADVIL Take 1 tablet (800 mg total) by mouth every 8 (eight) hours.   NIFEdipine 30 MG  24 hr tablet Commonly known as: Procardia XL Take 1 tablet (30 mg total) by mouth daily.   prenatal vitamin w/FE, FA 27-1 MG Tabs tablet Take 1 tablet by mouth daily at 12 noon.        Madeline Snowball, MSN, CNM 01/09/2024 12:49 PM  Certified Nurse Midwife, [facility]

## 2024-01-08 NOTE — MAU Note (Signed)
Madeline Mccormick is a 28 y.o. at Unknown here in MAU reporting: she has been checking BP at all day, called OB office and instructed to be evaluated in MAU.  Endorses HA & visual disturbances, denies epigastric pain.  States had epigastric pain yesterday.  Reports took Tylenol approximately 3 hours, some relief noted. S/P NVD 01/01/2024  LMP: NA Onset of complaint: today Pain score: 3 Vitals:   01/08/24 1651  BP: (!) 151/73  Pulse: (!) 51  Resp: 18  Temp: 98.1 F (36.7 C)  SpO2: 100%     FHT: NA  Lab orders placed from triage: None

## 2024-01-09 ENCOUNTER — Inpatient Hospital Stay (HOSPITAL_COMMUNITY): Payer: Medicaid Other

## 2024-01-09 ENCOUNTER — Inpatient Hospital Stay (HOSPITAL_COMMUNITY)
Admission: RE | Admit: 2024-01-09 | Payer: Medicaid Other | Source: Home / Self Care | Admitting: Obstetrics and Gynecology

## 2024-01-15 ENCOUNTER — Ambulatory Visit: Payer: Medicaid Other

## 2024-01-16 ENCOUNTER — Telehealth (HOSPITAL_COMMUNITY): Payer: Self-pay

## 2024-01-16 DIAGNOSIS — Z1331 Encounter for screening for depression: Secondary | ICD-10-CM

## 2024-01-16 NOTE — Telephone Encounter (Signed)
01/16/2024, 1501  Name: Madeline Mccormick MRN: [id number] DOB: [birth year]  Reason for Call:  Transition of Care Hospital Discharge Call  Contact Status: Patient Contact Status: Complete  Language assistant needed:          Follow-Up Questions: Do You Have Any Concerns About Your Health As You Heal From Delivery?: No (Patient reports that she is feeling much better. She states that she recently missed her BP check appointment with her provider. She states she plans to reschedule. She reports her BP has been running 110/70 and that her headaches are gone.) Do You Have Any Concerns About Your Infants Health?: No  Edinburgh Postnatal Depression Scale:  In the Past 7 Days: I have been able to laugh and see the funny side of things.: As much as I always could I have looked forward with enjoyment to things.: Rather less than I used to I have blamed myself unnecessarily when things went wrong.: Yes, some of the time I have been anxious or worried for no good reason.: Yes, sometimes I have felt scared or panicky for no good reason.: No, not much Things have been getting on top of me.: No, most of the time I have coped quite well I have been so unhappy that I have had difficulty sleeping.: Not very often I have felt sad or miserable.: Not very often I have been so unhappy that I have been crying.: Only occasionally The thought of harming myself has occurred to me.: Never Inocente Salles Postnatal Depression Scale Total: (!) 10  RN told patient about Maternal Mental Health Resources ([facility], National Maternal Mental Health Hotline, Postpartum Support International, National Suicide and Crisis Lifeline). Also told patient about [facility] support group offerings. Will e-mail these resources to patient as well.  Patient states that she recently saw her psychiatrist and started lexapro. She consents to a referral for counseling. Referral placed for integrated behavioral health.    PHQ2-9  Depression Scale:     Discharge Follow-up: Edinburgh score requires follow up?: Yes Provider notified of Edinburgh score?: Yes Have you already been referred for a counseling appointment?: No Patient was advised of the following resources:: Breastfeeding Support Group, Support Group  Post-discharge interventions: Reviewed Newborn Safe Sleep Practices Maternal Mental Health Resources provided Placed IBH referral in patient's chart  Signature  Signe Colt

## 2024-01-23 NOTE — BH Specialist Note (Signed)
Integrated Behavioral Health via Telemedicine Visit  01/30/2024 Madeline Mccormick [id number]  Number of Integrated Behavioral Health Clinician visits: 1- Initial Visit  Session Start time: 0815   Session End time: 0855  Total time in minutes: 40   Referring Provider: Shonna Chock, MD Patient/Family location: [facility] Provider location: [facility] for [facility]  All persons participating in visit: Patient Madeline Mccormick and [facility] Frederico Gerling   Types of Service: Individual psychotherapy and Video visit  I connected with Madeline Mccormick and/or Madeline Mccormick's  n/a  via  Telephone or [employer]  (Video is Surveyor, mining) and verified that I am speaking with the correct person using two identifiers. Discussed confidentiality: Yes   I discussed the limitations of telemedicine and the availability of in person appointments.  Discussed there is a possibility of technology failure and discussed alternative modes of communication if that failure occurs.  I discussed that engaging in this telemedicine visit, they consent to the provision of behavioral healthcare and the services will be billed under their insurance.  Patient and/or legal guardian expressed understanding and consented to Telemedicine visit: Yes   Presenting Concerns: Patient and/or family reports the following symptoms/concerns: Anxiety increasing; attributes to worry over 4yo experiencing autism (nonverbal; 4yo's father with brain injury); sadness over feeling alone at times (FOB is not around; pt's mother is her greatest support, but has complicated health issues). Pt worries about finances; may need to find childcare. Pt would like to find out if a rash she has is related to anxiety or something else.  Duration of problem: Increased stress postpartum; Severity of problem:  moderately severe  Patient and/or Family's Strengths/Protective Factors: Social  connections, Concrete supports in place (healthy food, safe environments, etc.), and Sense of purpose  Goals Addressed: Patient will:  Reduce symptoms of: anxiety, depression, and stress   Increase knowledge and/or ability of: healthy habits and stress reduction   Demonstrate ability to: Increase healthy adjustment to current life circumstances, Increase adequate support systems for patient/family, and Increase motivation to adhere to plan of care  Progress towards Goals: Ongoing  Interventions: Interventions utilized:  Psychoeducation and/or Health Education, Link to [employer], and Supportive Reflection Standardized Assessments completed: GAD-7 and PHQ 9  Patient and/or Family Response: Patient agrees with treatment plan.   Assessment: Patient currently experiencing Major depressive disorder,recurrent, moderate; Generalized anxiety disorder; ADHD (all as previously diagnosed).   Patient may benefit from psychoeducation and brief therapeutic interventions regarding coping with symptoms of depression, anxiety, life stress .  Plan: Follow up with behavioral health clinician on : Two weeks Behavioral recommendations:  -Continue prioritizing healthy self-care (regular meals, adequate rest; allowing practical help from supportive friends and family) until at least postpartum medical appointment -Consider new mom support group as needed at either www.postpartum.net or www.[facility].com  -Read through information on After Visit Summary; use as needed and discussed  Referral(s): Integrated Art gallery manager (In Clinic) and [employer] Resources:  Childcare; New mom support  I discussed the assessment and treatment plan with the patient and/or parent/guardian. They were provided an opportunity to ask questions and all were answered. They agreed with the plan and demonstrated an understanding of the instructions.   They were advised to call back or seek an in-person  evaluation if the symptoms worsen or if the condition fails to improve as anticipated.  Rae Lips, LCSW     01/30/2024    8:36 AM 06/13/2023    5:01 PM  08/15/2022    3:44 PM 06/16/2022    3:54 PM 10/01/2020    4:43 PM  Depression screen PHQ 2/9  Decreased Interest 1 2 1 1 1   Down, Depressed, Hopeless 1 2 1 1 1   PHQ - 2 Score 2 4 2 2 2   Altered sleeping 3 2 1 3 1   Tired, decreased energy 3 3 2 2 2   Change in appetite 0 1 0 3 1  Feeling bad or failure about yourself  1 0 0 1 0  Trouble concentrating 3 0 0 1 2  Moving slowly or fidgety/restless 0 0 0 0 0  Suicidal thoughts 0 0 0 0 0  PHQ-9 Score 12 10 5 12 8       01/30/2024    8:39 AM 06/13/2023    5:01 PM 08/15/2022    3:45 PM 06/16/2022    3:54 PM  GAD 7 : Generalized Anxiety Score  Nervous, Anxious, on Edge 3 0 0 0  Control/stop worrying 1 0 0 0  Worry too much - different things 1 0 0 1  Trouble relaxing 3 0 1 1  Restless 1 0 1 1  Easily annoyed or irritable 0 1 1 0  Afraid - awful might happen 1 0 0 1  Total GAD 7 Score 10 1 3  4

## 2024-01-30 ENCOUNTER — Ambulatory Visit (INDEPENDENT_AMBULATORY_CARE_PROVIDER_SITE_OTHER): Payer: Medicaid Other | Admitting: Clinical

## 2024-01-30 DIAGNOSIS — F331 Major depressive disorder, recurrent, moderate: Secondary | ICD-10-CM | POA: Diagnosis not present

## 2024-01-30 DIAGNOSIS — F411 Generalized anxiety disorder: Secondary | ICD-10-CM

## 2024-01-30 DIAGNOSIS — F909 Attention-deficit hyperactivity disorder, unspecified type: Secondary | ICD-10-CM

## 2024-01-30 NOTE — Patient Instructions (Signed)
Center for [facility] Healthcare at [facility] for Women [address] [place], [place] [address] [phone] (main office) [phone] (Jadira Nierman's office)  [place] Child Counsellor  (Childcare options, Early childcare development, etc.) DietDisorder.cz  [employer] Child Care Facility Search Engine  https://ncchildcare.http://cook.com/  New Parent Support Groups www.postpartum.net www.conehealthybaby.com                  [employer] Autism Resources  [employer] Schools: Autism Resources http://www1.SouvenirBaseball.es.htm  Partnership for Children [facility] https://www.[place]children.org/families/developmental-needs/  [employer] Centers PreviewDomains.se  [employer]oms On Main https://triadmomsonmain.com/my-blog/special-needs-resources/  Autism Society https://www.autismsociety-[place].org/find-help/  Embrace Autism embrace-autism.com

## 2024-02-01 ENCOUNTER — Other Ambulatory Visit (HOSPITAL_COMMUNITY)
Admission: RE | Admit: 2024-02-01 | Discharge: 2024-02-01 | Disposition: A | Discharge status: Home / Self Care | Payer: Medicaid Other | Source: Ambulatory Visit | Attending: Family Medicine | Admitting: Family Medicine

## 2024-02-01 ENCOUNTER — Ambulatory Visit (INDEPENDENT_AMBULATORY_CARE_PROVIDER_SITE_OTHER): Payer: Medicaid Other | Admitting: Family Medicine

## 2024-02-01 ENCOUNTER — Other Ambulatory Visit: Payer: Self-pay

## 2024-02-01 ENCOUNTER — Telehealth (HOSPITAL_COMMUNITY): Payer: Medicaid Other | Admitting: Psychiatry

## 2024-02-01 ENCOUNTER — Encounter: Payer: Self-pay | Admitting: Family Medicine

## 2024-02-01 VITALS — BP 113/87 | HR 57 | Wt 172.9 lb

## 2024-02-01 DIAGNOSIS — N898 Other specified noninflammatory disorders of vagina: Secondary | ICD-10-CM | POA: Diagnosis not present

## 2024-02-01 DIAGNOSIS — R87612 Low grade squamous intraepithelial lesion on cytologic smear of cervix (LGSIL): Secondary | ICD-10-CM

## 2024-02-01 NOTE — BH Specialist Note (Unsigned)
 Integrated Behavioral Health via Telemedicine Visit  02/13/2024 Madeline Mccormick [id number]  Number of Integrated Behavioral Health Clinician visits: 2- Second Visit  Session Start time: [id number]   Session End time: 1003  Total time in minutes: 14   Referring Provider: Shonna Chock, MD Patient/Family location: Home [facility] Provider location: Center for [facility] Healthcare at [facility] Women  All persons participating in visit: Patient Madeline Mccormick and [facility] Justyn Boyson   Types of Service: Individual psychotherapy and Video visit  I connected with Madeline Mccormick and/or Madeline Mccormick's  n/a  via  Telephone or [employer]  (Video is Surveyor, mining) and verified that I am speaking with the correct person using two identifiers. Discussed confidentiality: Yes   I discussed the limitations of telemedicine and the availability of in person appointments.  Discussed there is a possibility of technology failure and discussed alternative modes of communication if that failure occurs.  I discussed that engaging in this telemedicine visit, they consent to the provision of behavioral healthcare and the services will be billed under their insurance.  Patient and/or legal guardian expressed understanding and consented to Telemedicine visit: Yes   Presenting Concerns: Patient and/or family reports the following symptoms/concerns: Reduced symptoms of depression and anxiety after increasing Lexapro, with no noticeable negative side effects; pt and baby have an improved sleep routine, and older son has started ABA therapy; no specific concern today.  Duration of problem: Ongoing; Severity of problem: mild  Patient and/or Family's Strengths/Protective Factors: Social connections, Concrete supports in place (healthy food, safe environments, etc.), Sense of purpose, and Physical Health (exercise, healthy diet, medication compliance, etc.)  Goals Addressed: Patient  will:  Maintain reduced symptoms of: anxiety, depression, and stress    Demonstrate ability to: Increase healthy adjustment to current life circumstances  Progress towards Goals: Achieved  Interventions: Interventions utilized:  Medication Monitoring and Supportive Reflection Standardized Assessments completed: GAD-7 and PHQ 9  Patient and/or Family Response: Patient agrees with treatment plan.   Assessment: Patient currently experiencing Major depressive disorder, recurrent, mild; Generalized anxiety disorder; ADHD .   Patient may benefit from continued therapeutic intervention .  Plan: Follow up with behavioral health clinician on : Call Delayla Hoffmaster at [phone], as needed. Behavioral recommendations:  -Continue taking Lexapro as prescribed; continue plan to attend upcoming psychiatry appointment on 04/05/24 -Continue prioritizing healthy self-care daily (positive outlook; sleep when baby sleeps, healthy meals, accept help from supportive people in life) -Continue to consider new mom support group and childcare resources (on After Visit Summary) as needed in the future Referral(s): Integrated Art gallery manager (In Clinic) and [employer]:  new mom support; childcare  I discussed the assessment and treatment plan with the patient and/or parent/guardian. They were provided an opportunity to ask questions and all were answered. They agreed with the plan and demonstrated an understanding of the instructions.   They were advised to call back or seek an in-person evaluation if the symptoms worsen or if the condition fails to improve as anticipated.  Rae Lips, LCSW     02/13/2024    9:53 AM 01/30/2024    8:36 AM 06/13/2023    5:01 PM 08/15/2022    3:44 PM 06/16/2022    3:54 PM  Depression screen PHQ 2/9  Decreased Interest 1 1 2 1 1   Down, Depressed, Hopeless 0 1 2 1 1   PHQ - 2 Score 1 2 4 2 2   Altered sleeping 0 3 2 1 3   Tired, decreased  energy 1 3 3 2 2    Change in appetite 0 0 1 0 3  Feeling bad or failure about yourself  0 1 0 0 1  Trouble concentrating 1 3 0 0 1  Moving slowly or fidgety/restless 0 0 0 0 0  Suicidal thoughts 0 0 0 0 0  PHQ-9 Score 3 12 10 5 12       02/13/2024    9:58 AM 01/30/2024    8:39 AM 06/13/2023    5:01 PM 08/15/2022    3:45 PM  GAD 7 : Generalized Anxiety Score  Nervous, Anxious, on Edge 1 3 0 0  Control/stop worrying 0 1 0 0  Worry too much - different things 0 1 0 0  Trouble relaxing 1 3 0 1  Restless 1 1 0 1  Easily annoyed or irritable 1 0 1 1  Afraid - awful might happen 1 1 0 0  Total GAD 7 Score 5 10 1  3

## 2024-02-01 NOTE — Progress Notes (Signed)
Post Partum Visit Note  Madeline Mccormick is a 28 y.o. [phone] female who presents for a postpartum visit. She is 4 weeks 3 days postpartum following a normal spontaneous vaginal delivery.  I have fully reviewed the prenatal and intrapartum course. The delivery was at 40 gestational weeks.  Anesthesia: epidural. Postpartum course has been WNL. Baby is doing well. Baby is feeding by bottle - Similac Sensitive RS. Bleeding staining only. Bowel function is normal. Bladder function is normal. Patient is not sexually active. Contraception method is none. Postpartum depression screening: positive.   Upstream - 02/01/24 1626       Pregnancy Intention Screening   Does the patient want to become pregnant in the next year? No    Does the patient's partner want to become pregnant in the next year? No    Would the patient like to discuss contraceptive options today? Yes      Contraception Wrap Up   Current Method Abstinence            The pregnancy intention screening data noted above was reviewed. Potential methods of contraception were discussed. The patient elected to proceed with No data recorded.   Edinburgh Postnatal Depression Scale - 02/01/24 1629       Edinburgh Postnatal Depression Scale:  In the Past 7 Days   I have been able to laugh and see the funny side of things. 1    I have looked forward with enjoyment to things. 3    I have blamed myself unnecessarily when things went wrong. 2    I have been anxious or worried for no good reason. 0    I have felt scared or panicky for no good reason. 0    Things have been getting on top of me. 2    I have been so unhappy that I have had difficulty sleeping. 0    I have felt sad or miserable. 1    I have been so unhappy that I have been crying. 1    The thought of harming myself has occurred to me. 0    Edinburgh Postnatal Depression Scale Total 10             Health Maintenance Due  Topic Date Due   COVID-19 Vaccine (1 - 2024-25  season) Never done    The following portions of the patient's history were reviewed and updated as appropriate: allergies, current medications, past family history, past medical history, past social history, past surgical history, and problem list.  Review of Systems Pertinent items are noted in HPI.  Objective:  BP 113/87   Pulse (!) 57   Wt 172 lb 14.4 oz (78.4 kg)   LMP  (Exact Date)   Breastfeeding No   BMI 29.68 kg/m   Chaperone present for exam  General:  alert, cooperative, and appears stated age   Breasts:  not indicated  Lungs: Normal effort  Heart:  regular rate and rhythm  Abdomen: soft, non-tender; bowel sounds normal; no masses,  no organomegaly   GU exam:  normal       Assessment:   Postpartum exam.   Plan:   Essential components of care per ACOG recommendations:  1.  Mood and well being: Patient with positive depression screening today. Reviewed local resources for support.  - Patient tobacco use? No.   - hx of drug use? No.    2. Infant care and feeding:  -Patient currently breastmilk feeding? No.  -Social determinants of  health (SDOH) reviewed in EPIC. No concerns The following needs were identified limited support  3. Sexuality, contraception and birth spacing - Patient does not want a pregnancy in the next year.  Desired family size is 2 children.  - Reviewed reproductive life planning. Reviewed contraceptive methods based on pt preferences and effectiveness.  Patient desired Abstinence today.   Considering Copper IUD, will let [place] know if desires. - Discussed birth spacing of 18 months  4. Sleep and fatigue -Encouraged family/partner/community support of 4 hrs of uninterrupted sleep to help with mood and fatigue  5. Physical Recovery  - Discussed patients delivery and complications. She describes her labor as mixed. - Patient had a Vaginal, no problems at delivery. Patient had a 2nd degree laceration. Perineal healing reviewed. Patient expressed  understanding - Patient has urinary incontinence? No. - Patient is safe to resume physical and sexual activity  6.  Health Maintenance - HM due items addressed Yes - Last pap smear  Diagnosis  Date Value Ref Range Status  06/16/2022 - Low grade squamous intraepithelial lesion (LSIL) (A)  Final   Pap smear done at today's visit.  -Breast Cancer screening indicated? No.   7. Chronic Disease/Pregnancy Condition follow up: Hypertension Vaginal irritation Check wet prep and treat accordingly. - PCP follow up  [name], MD Center for [facility], [facility]

## 2024-02-02 ENCOUNTER — Encounter (HOSPITAL_COMMUNITY): Payer: Self-pay | Admitting: Psychiatry

## 2024-02-02 ENCOUNTER — Telehealth (HOSPITAL_COMMUNITY): Payer: Medicaid Other | Admitting: Psychiatry

## 2024-02-02 VITALS — Wt 172.0 lb

## 2024-02-02 DIAGNOSIS — F419 Anxiety disorder, unspecified: Secondary | ICD-10-CM

## 2024-02-02 DIAGNOSIS — F902 Attention-deficit hyperactivity disorder, combined type: Secondary | ICD-10-CM

## 2024-02-02 DIAGNOSIS — F331 Major depressive disorder, recurrent, moderate: Secondary | ICD-10-CM | POA: Diagnosis not present

## 2024-02-02 MED ORDER — ESCITALOPRAM OXALATE 10 MG PO TABS: 10.0000 mg | ORAL_TABLET | Freq: Every day | ORAL | 1 refills | Status: DC

## 2024-02-02 NOTE — Progress Notes (Signed)
[facility] MD Virtual Progress Note   Patient Location: Home Provider Location: Home Office  I connect with patient by video and verified that I am speaking with correct person by using two identifiers. I discussed the limitations of evaluation and management by telemedicine and the availability of in person appointments. I also discussed with the patient that there may be a patient responsible charge related to this service. The patient expressed understanding and agreed to proceed.  Madeline Mccormick [id number] 28 y.o.  02/02/2024 11:45 AM  History of Present Illness:  Patient is evaluated by video session.  She is with her newborn baby who is 27 weeks old.  She admitted there are times when she has some sadness and anxiety but denies any suicidal thoughts.  She believes she is going through postpartum blues.  She had a good support from her family members.  She recently had a visit with OB/GYN.  She had dry skin and rash and she was told probably eczema and given hydrocortisone cream which she has not started yet.  She was also told to change her soap.  Patient is pleased that she is off from hypertension medication as her blood pressure is much better.  She denies any irritability, anger, mania, psychosis.  She is struggled with chronic fatigue, sadness, some time energy level.  Sleep is fair.  She is a 28-year-old and now 33 weeks old.  Patient denies any hallucination or any suicidal thoughts.  She has no tremors, shakes.  She struggles with attention and concentration and used to take medicine for ADHD.  Currently she is not working.  Past Psychiatric History: H/O ADHD diagnosed by PCP but no formal psychological testing. H/O anxiety and depression. Good response with Vyvanse, Lexapro and Abilify. Meds d/c due to pregnancy. Tried BuSpar and Lamictal. Wellbutrin did not work. No h/o suicidal attempt, inpatient treatment, hallucination, OCD, PTSD or panic attacks.    Outpatient  Encounter Medications as of 02/02/2024  Medication Sig   acetaminophen (TYLENOL) 325 MG tablet Take 2 tablets (650 mg total) by mouth every 4 (four) hours as needed (for pain scale < 4).   escitalopram (LEXAPRO) 5 MG tablet Take 1 tablet (5 mg total) by mouth daily.   ibuprofen (ADVIL) 800 MG tablet Take 1 tablet (800 mg total) by mouth every 8 (eight) hours.   No facility-administered encounter medications on file as of 02/02/2024.    Recent Results (from the past 2160 hours)  Cervicovaginal ancillary only     Status: None   Collection Time: 12/11/23 11:02 AM  Result Value Ref Range   Neisseria Gonorrhea Negative    Chlamydia Negative    Comment Normal Reference Ranger Chlamydia - Negative    Comment      Normal Reference Range Neisseria Gonorrhea - Negative  Culture, beta strep (group b only)     Status: None   Collection Time: 12/11/23 12:58 PM   Specimen: Vaginal/Rectal; Genital   VR  Result Value Ref Range   Strep Gp B Culture Negative Negative    Comment: Centers for Disease Control and Prevention (CDC) and American Congress of Obstetricians and Gynecologists (ACOG) guidelines for prevention of perinatal group B streptococcal (GBS) disease specify co-collection of a vaginal and rectal swab specimen to maximize sensitivity of GBS detection. Per the CDC and ACOG, swabbing both the lower vagina and rectum substantially increases the yield of detection compared with sampling the vagina alone. Penicillin G, ampicillin, or cefazolin are indicated for intrapartum prophylaxis of  perinatal GBS colonization. Reflex susceptibility testing should be performed prior to use of clindamycin only on GBS isolates from penicillin-allergic women who are considered a high risk for anaphylaxis. Treatment with vancomycin without additional testing is warranted if resistance to clindamycin is noted.   Rupture of Membrane (ROM) Plus     Status: None   Collection Time: 12/31/23  9:53 PM  Result  Value Ref Range   Rom Plus NEGATIVE     Comment: Performed at [facility], [address], [place], [place] [address]  POCT fern test     Status: Normal   Collection Time: 12/31/23 10:01 PM  Result Value Ref Range   POCT Fern Test     Negative    CBC     Status: None   Collection Time: 12/31/23 11:27 PM  Result Value Ref Range   WBC 9.4 4.0 - 10.5 K/uL   RBC 4.14 3.87 - 5.11 MIL/uL   Hemoglobin 12.2 12.0 - 15.0 g/dL   HCT 19.1 47.8 - 29.5 %   MCV 88.9 80.0 - 100.0 fL   MCH 29.5 26.0 - 34.0 pg   MCHC 33.2 30.0 - 36.0 g/dL   RDW 62.1 30.8 - 65.7 %   Platelets 309 150 - 400 K/uL   nRBC 0.0 0.0 - 0.2 %    Comment: Performed at [facility], [address], [place], [place] [address]  Type and screen [facility]     Status: None   Collection Time: 12/31/23 11:27 PM  Result Value Ref Range   ABO/RH(D) A POS    Antibody Screen NEG    Sample Expiration      01/03/2024,2359 Performed at [facility], [address], [place], [place] [address]   RPR     Status: None   Collection Time: 12/31/23 11:27 PM  Result Value Ref Range   RPR Ser Ql NON REACTIVE NON REACTIVE    Comment: Performed at [facility], [address], [place], [place] [address]  Comprehensive metabolic panel     Status: Abnormal   Collection Time: 12/31/23 11:27 PM  Result Value Ref Range   Sodium 136 135 - 145 mmol/L   Potassium 3.6 3.5 - 5.1 mmol/L   Chloride 107 98 - 111 mmol/L   CO2 20 (L) 22 - 32 mmol/L   Glucose, Bld 95 70 - 99 mg/dL    Comment: Glucose reference range applies only to samples taken after fasting for at least 8 hours.   BUN 6 6 - 20 mg/dL   Creatinine, Ser 4.01 0.44 - 1.00 mg/dL   Calcium 9.0 8.9 - 02.7 mg/dL   Total Protein 6.3 (L) 6.5 - 8.1 g/dL   Albumin 2.7 (L) 3.5 - 5.0 g/dL   AST 15 15 - 41 U/L   ALT 6 0 - 44 U/L   Alkaline Phosphatase 153 (H) 38 - 126 U/L   Total Bilirubin 0.3 0.0 - 1.2 mg/dL   GFR, Estimated >25 >36 mL/min    Comment:  (NOTE) Calculated using the CKD-EPI Creatinine Equation (2021)    Anion gap 9 5 - 15    Comment: Performed at [facility], [address], [place], [place] [address]  Collect bld for placenta donatation     Status: None   Collection Time: 01/02/24  6:26 AM  Result Value Ref Range   Placenta donation bld collect Collected by Laboratory     Comment: Performed  at [facility], [address], [place], [place] [address]  Comprehensive metabolic panel     Status: Abnormal   Collection Time: 01/08/24  5:35 PM  Result Value Ref Range   Sodium 138 135 - 145 mmol/L   Potassium 3.6 3.5 - 5.1 mmol/L   Chloride 108 98 - 111 mmol/L   CO2 23 22 - 32 mmol/L   Glucose, Bld 81 70 - 99 mg/dL    Comment: Glucose reference range applies only to samples taken after fasting for at least 8 hours.   BUN 10 6 - 20 mg/dL   Creatinine, Ser 6.04 0.44 - 1.00 mg/dL   Calcium 8.3 (L) 8.9 - 10.3 mg/dL   Total Protein 6.0 (L) 6.5 - 8.1 g/dL   Albumin 2.8 (L) 3.5 - 5.0 g/dL   AST 13 (L) 15 - 41 U/L   ALT 7 0 - 44 U/L   Alkaline Phosphatase 100 38 - 126 U/L   Total Bilirubin 0.5 0.0 - 1.2 mg/dL   GFR, Estimated >54 >09 mL/min    Comment: (NOTE) Calculated using the CKD-EPI Creatinine Equation (2021)    Anion gap 7 5 - 15    Comment: Performed at [facility], [address], [place], [place] [address]  CBC with Differential/Platelet     Status: Abnormal   Collection Time: 01/08/24  5:35 PM  Result Value Ref Range   WBC 6.1 4.0 - 10.5 K/uL   RBC 3.53 (L) 3.87 - 5.11 MIL/uL   Hemoglobin 10.3 (L) 12.0 - 15.0 g/dL   HCT 47.8 (L) 29.5 - 62.1 %   MCV 89.8 80.0 - 100.0 fL   MCH 29.2 26.0 - 34.0 pg   MCHC 32.5 30.0 - 36.0 g/dL   RDW 30.8 65.7 - 84.6 %   Platelets 322 150 - 400 K/uL   nRBC 0.0 0.0 - 0.2 %   Neutrophils Relative % 53 %   Neutro Abs 3.3 1.7 - 7.7 K/uL   Lymphocytes Relative 31 %   Lymphs Abs 1.9 0.7 - 4.0 K/uL   Monocytes Relative 9 %   Monocytes Absolute 0.6 0.1 - 1.0 K/uL    Eosinophils Relative 5 %   Eosinophils Absolute 0.3 0.0 - 0.5 K/uL   Basophils Relative 1 %   Basophils Absolute 0.0 0.0 - 0.1 K/uL   Immature Granulocytes 1 %   Abs Immature Granulocytes 0.06 0.00 - 0.07 K/uL    Comment: Performed at [facility], [address], [place], [place] [address]     Psychiatric Specialty Exam: Physical Exam  Review of Systems  Weight 172 lb (78 kg), not currently breastfeeding.There is no height or weight on file to calculate BMI.  General Appearance: Casual  Eye Contact:  Good  Speech:  Normal Rate  Volume:  Normal  Mood:  Anxious and Dysphoric  Affect:  Appropriate  Thought Process:  Goal Directed  Orientation:  Full (Time, Place, and Person)  Thought Content:  Logical  Suicidal Thoughts:  No  Homicidal Thoughts:  No  Memory:  Immediate;   Good Recent;   Good Remote;   Good  Judgement:  Intact  Insight:  Present  Psychomotor Activity:  Decreased  Concentration:  Concentration: Good and Attention Span: Good  Recall:  Good  Fund of Knowledge:  Good  Language:  Good  Akathisia:  No  Handed:  Right  AIMS (if indicated):     Assets:  Communication Skills Desire for Improvement Housing  Resilience Social Support Transportation  ADL's:  Intact  Cognition:  WNL  Sleep:  fair     Assessment/Plan: MDD (major depressive disorder), recurrent episode, moderate (HCC) - Plan: escitalopram (LEXAPRO) 10 MG tablet  Anxiety - Plan: escitalopram (LEXAPRO) 10 MG tablet  Attention deficit hyperactivity disorder (ADHD), combined type - Plan: escitalopram (LEXAPRO) 10 MG tablet  I reviewed blood work results.  Chronic anxiety, dysphoria and possible postpartum blues.  Patient is not breast-feeding.  She is taking Lexapro 5 mg.  She used to take Lexapro 20 mg along with Wellbutrin and Vyvanse.  So far no major concerns from the medication.  I recommend to increase Lexapro to 10 mg daily.  Will consider adding ADHD medication in the future if  patient started working and patient agreed with the plan.  Patient has a good support from her family and children's father.  Recommended to call us back if she is any question or any concern.  Follow-up in 2 months.   Follow Up Instructions:     I discussed the assessment and treatment plan with the patient. The patient was provided an opportunity to ask questions and all were answered. The patient agreed with the plan and demonstrated an understanding of the instructions.   The patient was advised to call back or seek an in-person evaluation if the symptoms worsen or if the condition fails to improve as anticipated.    Collaboration of Care: Other provider involved in patient's care AEB notes are available in epic to review  Patient/Guardian was advised Release of Information must be obtained prior to any record release in order to collaborate their care with an outside provider. Patient/Guardian was advised if they have not already done so to contact the registration department to sign all necessary forms in order for [place] to release information regarding their care.   Consent: Patient/Guardian gives verbal consent for treatment and assignment of benefits for services provided during this visit. Patient/Guardian expressed understanding and agreed to proceed.     I provided 23 minutes of non face to face time during this encounter.  Note: This document was prepared by [employer] voice dictation technology and any errors that results from this process are unintentional.    Cleotis Nipper, MD 02/02/2024

## 2024-02-05 LAB — CERVICOVAGINAL ANCILLARY ONLY
Bacterial Vaginitis (gardnerella): NEGATIVE
Candida Glabrata: NEGATIVE
Candida Vaginitis: NEGATIVE
Comment: NEGATIVE
Comment: NEGATIVE
Comment: NEGATIVE

## 2024-02-06 LAB — CYTOLOGY - PAP
Comment: NEGATIVE
Comment: NEGATIVE
Comment: NEGATIVE
HPV 16: NEGATIVE
HPV 18 / 45: NEGATIVE
High risk HPV: POSITIVE — AB

## 2024-02-07 ENCOUNTER — Encounter: Payer: Self-pay | Admitting: Family Medicine

## 2024-02-08 ENCOUNTER — Telehealth: Payer: Self-pay

## 2024-02-08 NOTE — Telephone Encounter (Addendum)
-----   Message from [name] sent at 02/07/2024 12:22 PM EST ----- Needs colpo  Called pt and confirmed with pt that she has read provider's message via MyChart.  Pt stated she has read the message and did not have any other questions.  Pt agreed to appt on 04/08/24 for colpo.    Madeline Mccormick

## 2024-02-13 ENCOUNTER — Ambulatory Visit: Payer: Medicaid Other | Admitting: Clinical

## 2024-02-13 DIAGNOSIS — F411 Generalized anxiety disorder: Secondary | ICD-10-CM

## 2024-02-13 DIAGNOSIS — F330 Major depressive disorder, recurrent, mild: Secondary | ICD-10-CM

## 2024-02-13 DIAGNOSIS — F909 Attention-deficit hyperactivity disorder, unspecified type: Secondary | ICD-10-CM

## 2024-02-13 NOTE — Patient Instructions (Signed)
Center for [facility] at [facility] [address] [place], [place] [address] [phone] (main office) [phone] (Rodney Yera's office)  New Parent Support Groups www.postpartum.net www.conehealthybaby.com  [place] Child Development  (Childcare options, Early childcare development, etc.) www.[place]childdev.org  [place] Child Care Facility Search Engine  https://ncchildcare.ncdhhs.gov/childcaresearch  

## 2024-03-07 ENCOUNTER — Ambulatory Visit: Payer: Medicaid Other | Admitting: Obstetrics and Gynecology

## 2024-04-05 ENCOUNTER — Encounter (HOSPITAL_COMMUNITY): Payer: Self-pay | Admitting: Psychiatry

## 2024-04-05 ENCOUNTER — Telehealth (HOSPITAL_BASED_OUTPATIENT_CLINIC_OR_DEPARTMENT_OTHER): Payer: Medicaid Other | Admitting: Psychiatry

## 2024-04-05 VITALS — Wt 174.0 lb

## 2024-04-05 DIAGNOSIS — F902 Attention-deficit hyperactivity disorder, combined type: Secondary | ICD-10-CM

## 2024-04-05 DIAGNOSIS — F331 Major depressive disorder, recurrent, moderate: Secondary | ICD-10-CM

## 2024-04-05 DIAGNOSIS — F419 Anxiety disorder, unspecified: Secondary | ICD-10-CM | POA: Diagnosis not present

## 2024-04-05 MED ORDER — ESCITALOPRAM OXALATE 10 MG PO TABS: 10.0000 mg | ORAL_TABLET | Freq: Every day | ORAL | 2 refills | Status: DC

## 2024-04-05 NOTE — Progress Notes (Signed)
 [facility] MD Virtual Progress Note   Patient Location: In Car Provider Location: Home Office  I connect with patient by video and verified that I am speaking with correct person by using two identifiers. I discussed the limitations of evaluation and management by telemedicine and the availability of in person appointments. I also discussed with the patient that there may be a patient responsible charge related to this service. The patient expressed understanding and agreed to proceed.  Madeline Mccormick [id number] 28 y.o.  04/05/2024 9:33 AM  History of Present Illness:  Patient is evaluated by video session.  She is taking Lexapro  10 mg.  She started working month ago 3 days a week in [employer] and so far her job is going well.  Sometimes she feels anxiety and struggle with ADHD symptoms but they are manageable.  She had a good support from her family ember.  Her baby is now 74 months old.  Patient also has a 4-year-old.  She is seeing child psychotherapist on a regular basis.  She denies any irritability, crying, mania, psychosis or any hallucination.  She denies any major panic attack.  Her energy level is low.  She is now more focused on her diet and eating healthy and that has been very helpful.  She sleeps good.  She has no longer postpartum blues since Lexapro  dose increased to 10 mg.  So far tolerating very well and has no tremor or shakes or any EPS.  Continue job is going well.  Past Psychiatric History: H/O ADHD diagnosed by PCP but no formal psychological testing. H/O anxiety and depression. Good response with Vyvanse , Lexapro  and Abilify . Meds d/c due to pregnancy. Tried BuSpar and Lamictal . Wellbutrin  did not work. No h/o suicidal attempt, inpatient treatment, hallucination, OCD, PTSD or panic attacks.    Outpatient Encounter Medications as of 04/05/2024  Medication Sig   acetaminophen  (TYLENOL ) 325 MG tablet Take 2 tablets (650 mg total) by mouth every 4 (four) hours as  needed (for pain scale < 4).   escitalopram  (LEXAPRO ) 10 MG tablet Take 1 tablet (10 mg total) by mouth daily.   ibuprofen  (ADVIL ) 800 MG tablet Take 1 tablet (800 mg total) by mouth every 8 (eight) hours.   No facility-administered encounter medications on file as of 04/05/2024.    Recent Results (from the past 2160 hours)  Comprehensive metabolic panel     Status: Abnormal   Collection Time: 01/08/24  5:35 PM  Result Value Ref Range   Sodium 138 135 - 145 mmol/L   Potassium 3.6 3.5 - 5.1 mmol/L   Chloride 108 98 - 111 mmol/L   CO2 23 22 - 32 mmol/L   Glucose, Bld 81 70 - 99 mg/dL    Comment: Glucose reference range applies only to samples taken after fasting for at least 8 hours.   BUN 10 6 - 20 mg/dL   Creatinine, Ser 9.30 0.44 - 1.00 mg/dL   Calcium 8.3 (L) 8.9 - 10.3 mg/dL   Total Protein 6.0 (L) 6.5 - 8.1 g/dL   Albumin  2.8 (L) 3.5 - 5.0 g/dL   AST 13 (L) 15 - 41 U/L   ALT 7 0 - 44 U/L   Alkaline Phosphatase 100 38 - 126 U/L   Total Bilirubin 0.5 0.0 - 1.2 mg/dL   GFR, Estimated >39 >39 mL/min    Comment: (NOTE) Calculated using the CKD-EPI Creatinine Equation (2021)    Anion gap 7 5 - 15    Comment: Performed  at [facility], [address] [place], [place] [address]  CBC with Differential/Platelet     Status: Abnormal   Collection Time: 01/08/24  5:35 PM  Result Value Ref Range   WBC 6.1 4.0 - 10.5 K/uL   RBC 3.53 (L) 3.87 - 5.11 MIL/uL   Hemoglobin 10.3 (L) 12.0 - 15.0 g/dL   HCT 68.2 (L) 63.9 - 53.9 %   MCV 89.8 80.0 - 100.0 fL   MCH 29.2 26.0 - 34.0 pg   MCHC 32.5 30.0 - 36.0 g/dL   RDW 87.4 88.4 - 84.4 %   Platelets 322 150 - 400 K/uL   nRBC 0.0 0.0 - 0.2 %   Neutrophils Relative % 53 %   Neutro Abs 3.3 1.7 - 7.7 K/uL   Lymphocytes Relative 31 %   Lymphs Abs 1.9 0.7 - 4.0 K/uL   Monocytes Relative 9 %   Monocytes Absolute 0.6 0.1 - 1.0 K/uL   Eosinophils Relative 5 %   Eosinophils Absolute 0.3 0.0 - 0.5 K/uL   Basophils Relative 1 %   Basophils  Absolute 0.0 0.0 - 0.1 K/uL   Immature Granulocytes 1 %   Abs Immature Granulocytes 0.06 0.00 - 0.07 K/uL    Comment: Performed at [facility], [address] [place], [place] [address]  Cytology - PAP( [place])     Status: Abnormal   Collection Time: 02/01/24  5:03 PM  Result Value Ref Range   High risk HPV Positive (A)    HPV 16 Negative    HPV 18 / 45 Negative    Adequacy      Satisfactory for evaluation; transformation zone component PRESENT.   Diagnosis - Low grade squamous intraepithelial lesion (LSIL) (A)    Comment Normal Reference Range HPV - Negative    Comment Normal Reference Range HPV 16- Negative    Comment Normal Reference Range HPV 16 18 45 -Negative   Cervicovaginal ancillary only( [place])     Status: None   Collection Time: 02/01/24  5:03 PM  Result Value Ref Range   Bacterial Vaginitis (gardnerella) Negative    Candida Vaginitis Negative    Candida Glabrata Negative    Comment Normal Reference Range Candida Species - Negative    Comment Normal Reference Range Candida Galbrata - Negative    Comment      Normal Reference Range Bacterial Vaginosis - Negative     Psychiatric Specialty Exam: Physical Exam  Review of Systems  Weight 174 lb (78.9 kg), not currently breastfeeding.There is no height or weight on file to calculate BMI.  General Appearance: Casual  Eye Contact:  Good  Speech:  Clear and Coherent  Volume:  Normal  Mood:  Anxious  Affect:  Appropriate  Thought Process:  Goal Directed  Orientation:  Full (Time, Place, and Person)  Thought Content:  Logical  Suicidal Thoughts:  No  Homicidal Thoughts:  No  Memory:  Immediate;   Good Recent;   Good Remote;   Good  Judgement:  Good  Insight:  Good  Psychomotor Activity:  Normal  Concentration:  Concentration: Good and Attention Span: Good  Recall:  Good  Fund of Knowledge:  Good  Language:  Good  Akathisia:  No  Handed:  Right  AIMS (if indicated):     Assets:  Communication  Skills Desire for Improvement Housing Resilience Social Support Talents/Skills Transportation  ADL's:  Intact  Cognition:  WNL  Sleep:  good  02/13/2024    9:53 AM 01/30/2024    8:36 AM 06/13/2023    5:01 PM 08/15/2022    3:44 PM 06/16/2022    3:54 PM  Depression screen PHQ 2/9  Decreased Interest 1 1 2 1 1   Down, Depressed, Hopeless 0 1 2 1 1   PHQ - 2 Score 1 2 4 2 2   Altered sleeping 0 3 2 1 3   Tired, decreased energy 1 3 3 2 2   Change in appetite 0 0 1 0 3  Feeling bad or failure about yourself  0 1 0 0 1  Trouble concentrating 1 3 0 0 1  Moving slowly or fidgety/restless 0 0 0 0 0  Suicidal thoughts 0 0 0 0 0  PHQ-9 Score 3 12 10 5 12     Assessment/Plan: MDD (major depressive disorder), recurrent episode, moderate (HCC) - Plan: escitalopram  (LEXAPRO ) 10 MG tablet  Anxiety - Plan: escitalopram  (LEXAPRO ) 10 MG tablet  Attention deficit hyperactivity disorder (ADHD), combined type - Plan: escitalopram  (LEXAPRO ) 10 MG tablet  Patient doing better with increased dose of Lexapro .  She is taking 10 mg.  She used to take Lexapro  chronically Arlyss but so far milligram seems to be working well.  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  We discussed symptoms of ADHD in which patient experience but overall they are manageable and not worsening.  Patient adequate support from her family and children's father.  I recommend keep the current medication for now and if ADHD symptoms starting to get worse then we will consider adding the medication.  In the past she has taken Wellbutrin  and Vyvanse .  I encouraged to keep appointment with social worker which is very helpful.  Recommend walking and discussed healthy diet.  Patient enjoys accompanying of her 31-month-old and 20-year-old.  I recommend to call us  back if she has any question or any concern.  Will follow-up in 3 months.   Follow Up Instructions:     I discussed the assessment  and treatment plan with the patient. The patient was provided an opportunity to ask questions and all were answered. The patient agreed with the plan and demonstrated an understanding of the instructions.   The patient was advised to call back or seek an in-person evaluation if the symptoms worsen or if the condition fails to improve as anticipated.    Collaboration of Care: Other provider involved in patient's care AEB notes are available in epic to review  Patient/Guardian was advised Release of Information must be obtained prior to any record release in order to collaborate their care with an outside provider. Patient/Guardian was advised if they have not already done so to contact the registration department to sign all necessary forms in order for us  to release information regarding their care.   Consent: Patient/Guardian gives verbal consent for treatment and assignment of benefits for services provided during this visit. Patient/Guardian expressed understanding and agreed to proceed.     Total encounter time 16 minutes which includes face-to-face time, chart reviewed, care coordination, order entry and documentation during this encounter.   Note: This document was prepared by [employer] voice dictation technology and any errors that results from this process are unintentional.    Leni ONEIDA Client, MD 04/05/2024

## 2024-04-08 ENCOUNTER — Ambulatory Visit: Payer: Medicaid Other | Admitting: Family Medicine

## 2024-04-08 ENCOUNTER — Other Ambulatory Visit (HOSPITAL_COMMUNITY)
Admission: RE | Admit: 2024-04-08 | Discharge: 2024-04-08 | Disposition: A | Discharge status: Home / Self Care | Source: Ambulatory Visit | Attending: Family Medicine | Admitting: Family Medicine

## 2024-04-08 ENCOUNTER — Encounter: Payer: Self-pay | Admitting: Family Medicine

## 2024-04-08 ENCOUNTER — Other Ambulatory Visit: Payer: Self-pay

## 2024-04-08 VITALS — BP 114/74 | HR 63

## 2024-04-08 DIAGNOSIS — Z1331 Encounter for screening for depression: Secondary | ICD-10-CM | POA: Diagnosis not present

## 2024-04-08 DIAGNOSIS — R87612 Low grade squamous intraepithelial lesion on cytologic smear of cervix (LGSIL): Secondary | ICD-10-CM | POA: Diagnosis present

## 2024-04-08 DIAGNOSIS — Z3202 Encounter for pregnancy test, result negative: Secondary | ICD-10-CM | POA: Diagnosis not present

## 2024-04-08 LAB — POCT PREGNANCY, URINE: Preg Test, Ur: NEGATIVE

## 2024-04-08 NOTE — Progress Notes (Signed)
    GYNECOLOGY OFFICE COLPOSCOPY PROCEDURE NOTE  28 y.o. [id number] here for colposcopy for low-grade squamous intraepithelial neoplasia (LGSIL - encompassing HPV,mild dysplasia,CIN I) pap smear on 02/01/2024. Discussed role for HPV in cervical dysplasia, need for surveillance.  Patient gave informed written consent, time out was performed.  Placed in lithotomy position. Cervix viewed with speculum and colposcope after application of acetic acid.   Colposcopy adequate? Yes  acetowhite lesion(s) noted at SCJ 4 quadrant  biopsies obtained.  ECC specimen obtained. All specimens were labeled and sent to pathology.  Chaperone was present during entire procedure.  Patient was given post procedure instructions.  Will follow up pathology and manage accordingly; patient will be contacted with results and recommendations.  Routine preventative health maintenance measures emphasized.   Granville Layer 04/08/2024 8:41 AM

## 2024-04-10 ENCOUNTER — Ambulatory Visit: Payer: Medicaid Other | Admitting: Obstetrics and Gynecology

## 2024-04-10 LAB — SURGICAL PATHOLOGY

## 2024-04-15 ENCOUNTER — Encounter: Payer: Self-pay | Admitting: Family Medicine

## 2024-04-15 ENCOUNTER — Telehealth: Payer: Self-pay

## 2024-04-15 NOTE — Telephone Encounter (Signed)
-----   Message from Granville Layer sent at 04/15/2024  8:29 AM EDT ----- Still with low grade. Ask her about scheduling cryo for persistent CIN1? If not f/u pap in 1 year.

## 2024-04-15 NOTE — Telephone Encounter (Addendum)
-----   Message from Granville Layer sent at 04/15/2024  8:29 AM EDT ----- Still with low grade. Ask her about scheduling cryo for persistent CIN1? If not f/u pap in 1 year.  Pt notified of providers recommendation.  Pt decided to go with a cryo.  Pt agreed to receiving her appt via MyChart.  Front office notified to schedule pt with Adriana Hopping for cryo.    Breyana Follansbee,RN

## 2024-05-16 ENCOUNTER — Ambulatory Visit: Admitting: Family Medicine

## 2024-05-16 ENCOUNTER — Other Ambulatory Visit: Payer: Self-pay

## 2024-05-16 VITALS — BP 109/80 | HR 56 | Ht 64.0 in | Wt 173.1 lb

## 2024-05-16 DIAGNOSIS — R87612 Low grade squamous intraepithelial lesion on cytologic smear of cervix (LGSIL): Secondary | ICD-10-CM | POA: Diagnosis not present

## 2024-05-16 DIAGNOSIS — Z3202 Encounter for pregnancy test, result negative: Secondary | ICD-10-CM

## 2024-05-16 LAB — POCT PREGNANCY, URINE: Preg Test, Ur: NEGATIVE

## 2024-05-16 NOTE — Progress Notes (Signed)
    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 She was counseled about that efficacy of this procedure, and possible need for excisional procedure in the future if her cervical dysplasia persists.  The risks of the procedure where explained in detail and patient was told to expect a copious amount of discharge in the next few weeks. All her questions were answered, and written informed consent was obtained.   The patient was placed in the dorsal lithotomy position and a vaginal speculum was placed. Her cervix was visualized and patient was noted to have had normal size transformation zone. The appropriate cryotherapy probe was picked and affixed to cryotherapy apparatus. Then nitrogen gas was then activated, the probe was coated with lubricating jelly and applied to the transformation zone of the cervix. This was kept in place for 3 minutes. The cryotherapy was then stopped and all instruments were removed from the patient's pelvis; a thawing period of 3 minutes was observed.  A second cycle of cryotherapy was then administered to the cervix for 3 minutes. Chaperone was present during entire procedure.  The patient tolerated the procedure well without any complications. Routine post procedure instructions were given to the patient.  Will repeat pap smear in 12 months and manage accordingly.   Granville Layer, MD 05/16/2024 1:05 PM

## 2024-07-08 ENCOUNTER — Telehealth (HOSPITAL_COMMUNITY): Admitting: Psychiatry

## 2024-07-08 ENCOUNTER — Encounter (HOSPITAL_COMMUNITY): Payer: Self-pay

## 2024-07-22 ENCOUNTER — Telehealth (HOSPITAL_BASED_OUTPATIENT_CLINIC_OR_DEPARTMENT_OTHER): Admitting: Psychiatry

## 2024-07-22 ENCOUNTER — Encounter (HOSPITAL_COMMUNITY): Payer: Self-pay | Admitting: Psychiatry

## 2024-07-22 VITALS — Wt 160.0 lb

## 2024-07-22 DIAGNOSIS — F331 Major depressive disorder, recurrent, moderate: Secondary | ICD-10-CM | POA: Diagnosis not present

## 2024-07-22 DIAGNOSIS — F419 Anxiety disorder, unspecified: Secondary | ICD-10-CM | POA: Diagnosis not present

## 2024-07-22 DIAGNOSIS — F902 Attention-deficit hyperactivity disorder, combined type: Secondary | ICD-10-CM

## 2024-07-22 MED ORDER — ESCITALOPRAM OXALATE 10 MG PO TABS: 10.0000 mg | ORAL_TABLET | Freq: Every day | ORAL | 2 refills | Status: DC

## 2024-07-22 MED ORDER — LISDEXAMFETAMINE DIMESYLATE 20 MG PO CAPS: 20.0000 mg | ORAL_CAPSULE | Freq: Every day | ORAL | 0 refills | Status: DC

## 2024-07-22 NOTE — Progress Notes (Signed)
 [facility] MD Virtual Progress Note   Patient Location: Home Provider Location: Home Office  I connect with patient by video and verified that I am speaking with correct person by using two identifiers. I discussed the limitations of evaluation and management by telemedicine and the availability of in person appointments. I also discussed with the patient that there may be a patient responsible charge related to this service. The patient expressed understanding and agreed to proceed.  Madeline Mccormick [id number] 28 y.o.  07/22/2024 9:59 AM  History of Present Illness:  Patient is evaluated by video session.  She reported her depression and anxiety is under control but noticed worsening of ADHD symptoms.  She reported her brain is everywhere and she is not able to accomplish things at home.  She has stuff piled up and sometimes she gets frustrated when things are not done.  She has difficulty remembering things.  She missed doctors appointment.  Her attention concentration is not good.  She is working 3 days a week in [employer] and when she come home she has difficulty keeping her task at home.  She used to take Vyvanse  which was working very well before her pregnancy and after she became pregnant it was discontinued.  She like to resume the medication to help her ADHD symptoms.  So far no major concern from Lexapro .  She has no tremors, shakes or any EPS.  Recently she has seen OB/GYN for cryotherapy of the cervix which went well.  Patient has a good support from her family members.  Her son is almost 62-year old and another child is 69 months old.  She is trying to focus on her general health.  She started walking and going to park every day when weather is good.  She lost 10 pounds.  She stopped soda and sweets.  She wants to continue Lexapro .  She denies drinking or using any illegal substances.  Past Psychiatric History: H/O ADHD diagnosed by PCP but no formal psychological testing.  H/O anxiety and depression. Good response with Vyvanse , Lexapro  and Abilify . Meds d/c due to pregnancy. Tried BuSpar and Lamictal . Wellbutrin  did not work. No h/o suicidal attempt, inpatient treatment, hallucination, OCD, PTSD or panic attacks.   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/15 chronic      Outpatient Encounter Medications as of 07/22/2024  Medication Sig   acetaminophen  (TYLENOL ) 325 MG tablet Take 2 tablets (650 mg total) by mouth every 4 (four) hours as needed (for pain scale < 4).   escitalopram  (LEXAPRO ) 10 MG tablet Take 1 tablet (10 mg total) by mouth daily.   ibuprofen  (ADVIL ) 800 MG tablet Take 1 tablet (800 mg total) by mouth every 8 (eight) hours.   No facility-administered encounter medications on file as of 07/22/2024.    Recent Results (from the past 2160 hours)  Pregnancy, urine  POC     Status: None   Collection Time: 05/16/24 11:41 AM  Result Value Ref Range   Preg Test, Ur NEGATIVE NEGATIVE    Comment:        THE SENSITIVITY OF THIS METHODOLOGY IS >24 mIU/mL      Psychiatric Specialty Exam: Physical Exam  Review of Systems  Weight 160 lb (72.6 kg), not currently breastfeeding.There is no height or weight on file to calculate BMI.  General Appearance: Casual  Eye Contact:  Good  Speech:  Clear and Coherent  Volume:  Normal  Mood:  Euthymic  Affect:  Appropriate  Thought Process:  Goal Directed  Orientation:  Full (Time, Place, and Person)  Thought Content:  Rumination   Suicidal Thoughts:  No  Homicidal Thoughts:  No  Memory:  Immediate;   Good Recent;   Good Remote;   Good  Judgement:  Good  Insight:  Good  Psychomotor Activity:  Normal  Concentration:  Concentration: Fair and Attention Span: Fair  Recall:  [employer] of Knowledge:  Fair  Language:  Good  Akathisia:  No  Handed:  Right  AIMS (if indicated):     Assets:  Communication Skills Desire for Improvement Housing Social Support Talents/Skills Transportation  ADL's:  Intact  Cognition:  WNL  Sleep:  ok       04/08/2024    9:05 AM 02/13/2024    9:53 AM 01/30/2024    8:36 AM 06/13/2023    5:01 PM 08/15/2022    3:44 PM  Depression screen PHQ 2/9  Decreased Interest 0 1 1 2 1   Down, Depressed, Hopeless 0 0 1 2 1   PHQ - 2 Score 0 1 2 4 2   Altered sleeping 1 0 3 2 1   Tired, decreased energy 1 1 3 3 2   Change in appetite 0 0 0 1 0  Feeling bad or failure about yourself  1 0 1 0 0  Trouble concentrating 1 1 3  0 0  Moving slowly or fidgety/restless 0 0 0 0 0  Suicidal thoughts 0 0 0 0 0  PHQ-9 Score 4 3 12 10 5     Assessment/Plan: MDD (major depressive disorder), recurrent episode, moderate (HCC) - Plan: escitalopram  (LEXAPRO ) 10 MG tablet  Anxiety - Plan: escitalopram  (LEXAPRO ) 10 MG tablet  Attention deficit hyperactivity disorder (ADHD), combined type - Plan: lisdexamfetamine (VYVANSE ) 20 MG capsule, escitalopram  (LEXAPRO ) 10 MG tablet  Discussed ADHD symptoms which has been causing issues in her daily life.  Not able to finish the task on time and getting distracted and stuff piled at home.  Review previous records.  She did very well on low-dose Vyvanse .  She was taking up to 30 mg.  I recommend to try the Vyvanse  20 mg to see how it works and if needed we can go up to 30 mg.  She also started journaling and taking reminders which is helpful.  She is no longer seeing Child psychotherapist.  Will continue Lexapro  10 mg it is helping her overall anxiety and depression.  She has no panic  attack.  Recommend to continue healthy lifestyle.  Discussed side effects of stimulants specially increased anxiety, tremors, shakes or insomnia.  Patient is aware about the side effects and agreed to give us  a call if any question.  Will follow-up in 3 months unless patient requested an earlier appointment.  Continue Lexapro  10 mg daily and start Vyvanse  20 mg daily.  I also encourage not to take on the weekends  to avoid dependency.  Discussed stimulant abuse, tolerance and withdrawal.   Follow Up Instructions:     I discussed the assessment and treatment plan with the patient. The patient was provided an opportunity to ask questions and all were answered. The patient agreed with the plan and demonstrated an understanding of the instructions.   The patient was advised to call back or seek an in-person evaluation if the symptoms worsen or if the condition fails to improve as anticipated.    Collaboration of Care: Other provider involved in patient's care AEB notes are available in epic to review  Patient/Guardian was advised Release of Information must be obtained prior to any record release in order to collaborate their care with an outside provider. Patient/Guardian was advised if they have not already done so to contact the registration department to sign all necessary forms in order for us  to release information regarding their care.   Consent: Patient/Guardian gives verbal consent for treatment and assignment of benefits for services provided during this visit. Patient/Guardian expressed understanding and agreed to proceed.     Total encounter time 25 minutes which includes face-to-face time, chart reviewed, care coordination, order entry and documentation during this encounter.   Note: This document was prepared by [employer] voice dictation technology and any errors that results from this process are unintentional.    Leni ONEIDA Client, MD 07/22/2024

## 2024-08-23 ENCOUNTER — Telehealth (HOSPITAL_COMMUNITY): Payer: Self-pay | Admitting: *Deleted

## 2024-08-23 DIAGNOSIS — F902 Attention-deficit hyperactivity disorder, combined type: Secondary | ICD-10-CM

## 2024-08-23 MED ORDER — LISDEXAMFETAMINE DIMESYLATE 20 MG PO CAPS: 20.0000 mg | ORAL_CAPSULE | Freq: Every day | ORAL | 0 refills | Status: DC

## 2024-08-23 NOTE — Telephone Encounter (Signed)
 Pt called requesting a refill of the Vyvanse  20 mg.   Last visit: 07/22/24 Next visit: 10/22/24

## 2024-08-23 NOTE — Telephone Encounter (Signed)
 Done

## 2024-10-10 ENCOUNTER — Telehealth (HOSPITAL_COMMUNITY): Payer: Self-pay

## 2024-10-10 DIAGNOSIS — F902 Attention-deficit hyperactivity disorder, combined type: Secondary | ICD-10-CM

## 2024-10-10 MED ORDER — LISDEXAMFETAMINE DIMESYLATE 20 MG PO CAPS: 20.0000 mg | ORAL_CAPSULE | Freq: Every day | ORAL | 0 refills | Status: DC

## 2024-10-10 NOTE — Telephone Encounter (Signed)
 Prescription called to the pharmacy.  Please inform the patient.  Thank you.

## 2024-10-10 NOTE — Telephone Encounter (Signed)
 Patient is calling for a refill on her Vyvanse  last filled on 9/5. She has a follow up on 11/4 and her pharmacy has not changed

## 2024-10-14 ENCOUNTER — Telehealth (HOSPITAL_COMMUNITY): Payer: Self-pay | Admitting: *Deleted

## 2024-10-14 NOTE — Telephone Encounter (Signed)
 Lisdexamfetamine 20 mg capsules (Vyvanse  brand name) is approved by [facility] Medicaid as brand name is preferred on their formulary.

## 2024-10-14 NOTE — Telephone Encounter (Signed)
 Called patient and lvm letting her know her medication ws at the pharmacy

## 2024-10-14 NOTE — Telephone Encounter (Signed)
 PA for Vyvanse  (Lisdexamfetamine) 20 mg capsules submitted to [facility] Medicaid [employer] via Cover My Meds portal. Awaiting determination.

## 2024-10-14 NOTE — Telephone Encounter (Signed)
 Thanks for the update

## 2024-10-20 ENCOUNTER — Other Ambulatory Visit (HOSPITAL_COMMUNITY): Payer: Self-pay | Admitting: Psychiatry

## 2024-10-20 DIAGNOSIS — F902 Attention-deficit hyperactivity disorder, combined type: Secondary | ICD-10-CM

## 2024-10-20 DIAGNOSIS — F331 Major depressive disorder, recurrent, moderate: Secondary | ICD-10-CM

## 2024-10-20 DIAGNOSIS — F419 Anxiety disorder, unspecified: Secondary | ICD-10-CM

## 2024-10-22 ENCOUNTER — Telehealth (HOSPITAL_COMMUNITY): Admitting: Psychiatry

## 2024-10-22 ENCOUNTER — Encounter (HOSPITAL_COMMUNITY): Payer: Self-pay | Admitting: Psychiatry

## 2024-10-22 VITALS — Wt 148.0 lb

## 2024-10-22 DIAGNOSIS — F902 Attention-deficit hyperactivity disorder, combined type: Secondary | ICD-10-CM

## 2024-10-22 DIAGNOSIS — F419 Anxiety disorder, unspecified: Secondary | ICD-10-CM | POA: Diagnosis not present

## 2024-10-22 DIAGNOSIS — F331 Major depressive disorder, recurrent, moderate: Secondary | ICD-10-CM

## 2024-10-22 MED ORDER — LISDEXAMFETAMINE DIMESYLATE 20 MG PO CAPS: 20.0000 mg | ORAL_CAPSULE | Freq: Every day | ORAL | 0 refills | Status: AC

## 2024-10-22 MED ORDER — ESCITALOPRAM OXALATE 10 MG PO TABS
10.0000 mg | ORAL_TABLET | Freq: Every day | ORAL | 2 refills | Status: AC
Start: 2024-10-22 — End: 2025-01-20

## 2024-10-22 NOTE — Progress Notes (Signed)
 [facility] MD Virtual Progress Note   Patient Location: Home Provider Location: Home Office  I connect with patient by video and verified that I am speaking with correct person by using two identifiers. I discussed the limitations of evaluation and management by telemedicine and the availability of in person appointments. I also discussed with the patient that there may be a patient responsible charge related to this service. The patient expressed understanding and agreed to proceed.  Madeline Mccormick [id number] 28 y.o.  10/22/2024 10:05 AM  History of Present Illness:  Patient is evaluated by video session.  She started taking Vyvanse  20 mg and that is helping her attention, focus and multitasking.  She is more organized and plan her day very well.  She is taking 4-5 times a week.  She is working 3 days in [employer] and that job is much better.  She is sleeping good.  She denies any panic attack, crying spells, feeling of hopelessness or any insomnia.  She is taking Lexapro  but is helping her depression and anxiety.  Sometimes she noticed tired overall but physically.  Mentally she is okay.  Her son is now 39 years old and 72-month-old.  Patient told son is going to start school next year.  She reported leadership is going very well with the boyfriend.  Since back on Vyvanse  she has more energy mentally.  She started walking every day.  She is watching her food and cut down her sweets and junk food.  She lost more than 10 pounds.  She is trying to focus on her general health.  She denies drinking or using any illegal substances.  She had a good support from her family.  Past Psychiatric History: H/O ADHD diagnosed by PCP but no formal psychological testing. H/O anxiety and depression. Good response with Vyvanse , Lexapro  and Abilify . Meds d/c due to pregnancy. Tried BuSpar and Lamictal . Wellbutrin  did not work. No h/o suicidal attempt, inpatient treatment, hallucination, OCD, PTSD or  panic attacks.   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/15 chronic      Outpatient Encounter Medications as of 10/22/2024  Medication Sig   acetaminophen  (TYLENOL ) 325 MG tablet Take 2 tablets (650 mg total) by mouth every 4 (four) hours as needed (for pain scale < 4).   escitalopram  (LEXAPRO ) 10 MG tablet Take 1 tablet (10 mg total) by mouth daily.   ibuprofen  (ADVIL ) 800 MG tablet Take 1 tablet (800 mg total) by mouth every 8 (eight) hours.   lisdexamfetamine (VYVANSE ) 20 MG capsule Take 1 capsule (20 mg total) by mouth daily.   No facility-administered encounter medications on file as of 10/22/2024.    No results found for this or any previous visit (from the past 2160 hours).   Psychiatric Specialty Exam: Physical Exam  Review of Systems  Weight 148 lb (67.1 kg), not currently  breastfeeding.There is no height or weight on file to calculate BMI.  General Appearance: Casual  Eye Contact:  Good  Speech:  Clear and Coherent and Normal Rate  Volume:  Normal  Mood:  Euthymic  Affect:  Appropriate  Thought Process:  Goal Directed  Orientation:  Full (Time, Place, and Person)  Thought Content:  Logical  Suicidal Thoughts:  No  Homicidal Thoughts:  No  Memory:  Immediate;   Good Recent;   Good Remote;   Good  Judgement:  Good  Insight:  Good  Psychomotor Activity:  Normal  Concentration:  Concentration: Good and Attention Span: Good  Recall:  Good  Fund of Knowledge:  Good  Language:  Good   Akathisia:  No  Handed:  Right  AIMS (if indicated):     Assets:  Communication Skills Desire for Improvement Housing Resilience Social Support Talents/Skills Transportation  ADL's:  Intact  Cognition:  WNL  Sleep:  ok       04/08/2024    9:05 AM 02/13/2024    9:53 AM 01/30/2024    8:36 AM 06/13/2023    5:01 PM 08/15/2022    3:44 PM  Depression screen PHQ 2/9  Decreased Interest 0 1 1 2 1   Down, Depressed, Hopeless 0 0 1 2 1   PHQ - 2 Score 0 1 2 4 2   Altered sleeping 1 0 3 2 1   Tired, decreased energy 1 1 3 3 2   Change in appetite 0 0 0 1 0  Feeling bad or failure about yourself  1 0 1 0 0  Trouble concentrating 1 1 3  0 0  Moving slowly or fidgety/restless 0 0 0 0 0  Suicidal thoughts 0 0 0 0 0  PHQ-9 Score 4 3 12 10 5     Assessment/Plan: MDD (major depressive disorder), recurrent episode, moderate (HCC) - Plan: escitalopram  (LEXAPRO ) 10 MG tablet  Anxiety - Plan: escitalopram  (LEXAPRO ) 10 MG tablet  Attention deficit hyperactivity disorder (ADHD), combined type - Plan: escitalopram  (LEXAPRO ) 10 MG tablet, lisdexamfetamine (VYVANSE ) 20 MG capsule  Patient is 28 year old Caucasian part-time employed with history of anxiety, major depressive disorder and ADHD combined type.  Doing better on low-dose Vyvanse  20 mg.  Discussed physical fatigue.  Patient has history of anemia and low iron .  I encouraged to have her labs to see if they are normal.  She is trying to focus on her general health and had cut down junk food and lost weight.  She has no tremor or shakes or any EPS.  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  Patient agreed to have a blood work with the PCP at [facility].  Continue Vyvanse  20 mg daily since it is working very well for her focus attention and ADHD symptoms.  Continue Lexapro  10 mg to help her depression and anxiety.  Recommend to call back if she has any question or any concern.  Follow-up in 3 months.   Follow Up Instructions:      I discussed the assessment and treatment plan with the patient. The patient was provided an opportunity to ask questions and all were answered. The patient agreed with the plan and demonstrated an understanding of the instructions.   The patient was advised to call back or seek an in-person evaluation if the symptoms worsen or if the condition fails to improve as anticipated.    Collaboration of Care: Other provider involved in patient's care AEB notes are available in epic to review  Patient/Guardian was advised Release  of Information must be obtained prior to any record release in order to collaborate their care with an outside provider. Patient/Guardian was advised if they have not already done so to contact the registration department to sign all necessary forms in order for us  to release information regarding their care.   Consent: Patient/Guardian gives verbal consent for treatment and assignment of benefits for services provided during this visit. Patient/Guardian expressed understanding and agreed to proceed.     Total encounter time 21 minutes which includes face-to-face time, chart reviewed, care coordination, order entry and documentation during this encounter.   Note: This document was prepared by [employer] voice dictation technology and any errors that results from this process are unintentional.    Leni ONEIDA Client, MD 10/22/2024

## 2025-01-27 ENCOUNTER — Telehealth (HOSPITAL_COMMUNITY): Admitting: Psychiatry
